# Patient Record
Sex: Female | Born: 1945 | Race: White | Hispanic: No | Marital: Married | Smoking: Former smoker
Health system: Southern US, Community
[De-identification: ages and names within clinical notes are randomized; demographics above are authoritative.]

## PROBLEM LIST (undated history)

## (undated) DIAGNOSIS — I471 Supraventricular tachycardia, unspecified: Secondary | ICD-10-CM

## (undated) DIAGNOSIS — E042 Nontoxic multinodular goiter: Secondary | ICD-10-CM

## (undated) DIAGNOSIS — R2689 Other abnormalities of gait and mobility: Secondary | ICD-10-CM

## (undated) DIAGNOSIS — M1990 Unspecified osteoarthritis, unspecified site: Secondary | ICD-10-CM

## (undated) DIAGNOSIS — F419 Anxiety disorder, unspecified: Secondary | ICD-10-CM

## (undated) DIAGNOSIS — E785 Hyperlipidemia, unspecified: Secondary | ICD-10-CM

## (undated) DIAGNOSIS — H532 Diplopia: Secondary | ICD-10-CM

## (undated) DIAGNOSIS — R55 Syncope and collapse: Secondary | ICD-10-CM

## (undated) DIAGNOSIS — F329 Major depressive disorder, single episode, unspecified: Secondary | ICD-10-CM

## (undated) DIAGNOSIS — I493 Ventricular premature depolarization: Secondary | ICD-10-CM

## (undated) DIAGNOSIS — F32A Depression, unspecified: Secondary | ICD-10-CM

## (undated) DIAGNOSIS — I10 Essential (primary) hypertension: Secondary | ICD-10-CM

## (undated) DIAGNOSIS — G4733 Obstructive sleep apnea (adult) (pediatric): Secondary | ICD-10-CM

## (undated) HISTORY — DX: Hyperlipidemia, unspecified: E78.5

## (undated) HISTORY — PX: TONSILLECTOMY: SUR1361

## (undated) HISTORY — DX: Supraventricular tachycardia: I47.1

## (undated) HISTORY — DX: Anxiety disorder, unspecified: F41.9

## (undated) HISTORY — DX: Depression, unspecified: F32.A

## (undated) HISTORY — DX: Major depressive disorder, single episode, unspecified: F32.9

## (undated) HISTORY — DX: Ventricular premature depolarization: I49.3

## (undated) HISTORY — DX: Unspecified osteoarthritis, unspecified site: M19.90

## (undated) HISTORY — DX: Obstructive sleep apnea (adult) (pediatric): G47.33

## (undated) HISTORY — DX: Other abnormalities of gait and mobility: R26.89

## (undated) HISTORY — DX: Supraventricular tachycardia, unspecified: I47.10

## (undated) HISTORY — PX: ABDOMINAL HYSTERECTOMY: SHX81

## (undated) HISTORY — DX: Essential (primary) hypertension: I10

## (undated) HISTORY — DX: Diplopia: H53.2

## (undated) HISTORY — PX: OTHER SURGICAL HISTORY: SHX169

## (undated) HISTORY — DX: Syncope and collapse: R55

## (undated) HISTORY — DX: Nontoxic multinodular goiter: E04.2

---

## 2002-07-02 ENCOUNTER — Emergency Department (HOSPITAL_COMMUNITY): Admission: EM | Admit: 2002-07-02 | Discharge: 2002-07-02 | Payer: Self-pay

## 2002-08-12 ENCOUNTER — Ambulatory Visit (HOSPITAL_BASED_OUTPATIENT_CLINIC_OR_DEPARTMENT_OTHER): Admission: RE | Admit: 2002-08-12 | Discharge: 2002-08-12 | Payer: Self-pay | Admitting: Cardiology

## 2003-06-25 ENCOUNTER — Ambulatory Visit (HOSPITAL_COMMUNITY): Admission: RE | Admit: 2003-06-25 | Discharge: 2003-06-25 | Payer: Self-pay | Admitting: Family Medicine

## 2003-09-01 ENCOUNTER — Inpatient Hospital Stay (HOSPITAL_COMMUNITY): Admission: EM | Admit: 2003-09-01 | Discharge: 2003-09-02 | Payer: Self-pay | Admitting: Emergency Medicine

## 2003-09-01 ENCOUNTER — Encounter (INDEPENDENT_AMBULATORY_CARE_PROVIDER_SITE_OTHER): Payer: Self-pay | Admitting: Cardiology

## 2003-09-05 ENCOUNTER — Emergency Department (HOSPITAL_COMMUNITY): Admission: EM | Admit: 2003-09-05 | Discharge: 2003-09-05 | Payer: Self-pay | Admitting: Emergency Medicine

## 2003-09-22 ENCOUNTER — Emergency Department (HOSPITAL_COMMUNITY): Admission: EM | Admit: 2003-09-22 | Discharge: 2003-09-22 | Payer: Self-pay | Admitting: Emergency Medicine

## 2003-09-27 ENCOUNTER — Emergency Department (HOSPITAL_COMMUNITY): Admission: EM | Admit: 2003-09-27 | Discharge: 2003-09-27 | Payer: Self-pay | Admitting: Emergency Medicine

## 2005-01-06 ENCOUNTER — Ambulatory Visit (HOSPITAL_COMMUNITY): Admission: RE | Admit: 2005-01-06 | Discharge: 2005-01-06 | Payer: Self-pay | Admitting: Cardiovascular Disease

## 2006-01-20 ENCOUNTER — Ambulatory Visit (HOSPITAL_COMMUNITY): Admission: RE | Admit: 2006-01-20 | Discharge: 2006-01-20 | Payer: Self-pay | Admitting: Cardiovascular Disease

## 2006-04-05 ENCOUNTER — Emergency Department (HOSPITAL_COMMUNITY): Admission: EM | Admit: 2006-04-05 | Discharge: 2006-04-05 | Payer: Self-pay | Admitting: Family Medicine

## 2006-07-21 ENCOUNTER — Emergency Department (HOSPITAL_COMMUNITY): Admission: EM | Admit: 2006-07-21 | Discharge: 2006-07-21 | Payer: Self-pay | Admitting: Emergency Medicine

## 2006-08-03 ENCOUNTER — Emergency Department (HOSPITAL_COMMUNITY): Admission: EM | Admit: 2006-08-03 | Discharge: 2006-08-03 | Payer: Self-pay | Admitting: Emergency Medicine

## 2007-02-09 ENCOUNTER — Ambulatory Visit (HOSPITAL_COMMUNITY): Admission: RE | Admit: 2007-02-09 | Discharge: 2007-02-09 | Payer: Self-pay | Admitting: Internal Medicine

## 2007-07-26 ENCOUNTER — Emergency Department (HOSPITAL_COMMUNITY): Admission: EM | Admit: 2007-07-26 | Discharge: 2007-07-27 | Payer: Self-pay | Admitting: Emergency Medicine

## 2007-07-27 ENCOUNTER — Inpatient Hospital Stay (HOSPITAL_COMMUNITY): Admission: EM | Admit: 2007-07-27 | Discharge: 2007-07-28 | Payer: Self-pay | Admitting: Emergency Medicine

## 2007-07-31 ENCOUNTER — Ambulatory Visit: Payer: Self-pay | Admitting: *Deleted

## 2008-04-03 ENCOUNTER — Ambulatory Visit (HOSPITAL_COMMUNITY): Admission: RE | Admit: 2008-04-03 | Discharge: 2008-04-03 | Payer: Self-pay | Admitting: Internal Medicine

## 2009-06-24 ENCOUNTER — Ambulatory Visit (HOSPITAL_COMMUNITY): Admission: RE | Admit: 2009-06-24 | Discharge: 2009-06-24 | Payer: Self-pay | Admitting: Internal Medicine

## 2010-04-20 ENCOUNTER — Encounter: Admission: RE | Admit: 2010-04-20 | Discharge: 2010-04-20 | Payer: Self-pay | Admitting: Internal Medicine

## 2010-07-17 ENCOUNTER — Other Ambulatory Visit: Payer: Self-pay | Admitting: Internal Medicine

## 2010-07-17 DIAGNOSIS — E041 Nontoxic single thyroid nodule: Secondary | ICD-10-CM

## 2010-07-18 ENCOUNTER — Encounter: Payer: Self-pay | Admitting: Internal Medicine

## 2010-07-19 ENCOUNTER — Encounter: Payer: Self-pay | Admitting: Internal Medicine

## 2010-09-20 ENCOUNTER — Other Ambulatory Visit (HOSPITAL_COMMUNITY): Payer: Self-pay | Admitting: Internal Medicine

## 2010-09-20 DIAGNOSIS — Z1231 Encounter for screening mammogram for malignant neoplasm of breast: Secondary | ICD-10-CM

## 2010-09-23 ENCOUNTER — Ambulatory Visit (HOSPITAL_COMMUNITY)
Admission: RE | Admit: 2010-09-23 | Discharge: 2010-09-23 | Disposition: A | Discharge status: Home / Self Care | Payer: Medicare Other | Source: Ambulatory Visit | Attending: Internal Medicine | Admitting: Internal Medicine

## 2010-09-23 DIAGNOSIS — Z1231 Encounter for screening mammogram for malignant neoplasm of breast: Secondary | ICD-10-CM | POA: Insufficient documentation

## 2010-10-14 ENCOUNTER — Emergency Department (HOSPITAL_COMMUNITY)
Admission: EM | Admit: 2010-10-14 | Discharge: 2010-10-14 | Disposition: A | Discharge status: Home / Self Care | Payer: Medicare Other | Attending: Emergency Medicine | Admitting: Emergency Medicine

## 2010-10-14 DIAGNOSIS — R0602 Shortness of breath: Secondary | ICD-10-CM | POA: Insufficient documentation

## 2010-10-14 DIAGNOSIS — I10 Essential (primary) hypertension: Secondary | ICD-10-CM | POA: Insufficient documentation

## 2010-10-14 DIAGNOSIS — R259 Unspecified abnormal involuntary movements: Secondary | ICD-10-CM | POA: Insufficient documentation

## 2010-10-14 DIAGNOSIS — R209 Unspecified disturbances of skin sensation: Secondary | ICD-10-CM | POA: Insufficient documentation

## 2010-10-14 DIAGNOSIS — R42 Dizziness and giddiness: Secondary | ICD-10-CM | POA: Insufficient documentation

## 2010-10-18 ENCOUNTER — Ambulatory Visit
Admission: RE | Admit: 2010-10-18 | Discharge: 2010-10-18 | Disposition: A | Discharge status: Home / Self Care | Payer: Medicare Other | Source: Ambulatory Visit | Attending: Internal Medicine | Admitting: Internal Medicine

## 2010-10-18 DIAGNOSIS — E041 Nontoxic single thyroid nodule: Secondary | ICD-10-CM

## 2010-11-09 NOTE — H&P (Signed)
NAMEILIZA, BLANKENBECKLER             ACCOUNT NO.:  [id number]   MEDICAL RECORD NO.:  [id number]          PATIENT TYPE:  INP   LOCATION:  1826                         FACILITY:  [facility]   PHYSICIAN:  Michiel Cowboy, MDDATE OF BIRTH:  Apr 25, 1946   DATE OF ADMISSION:  07/27/2007  DATE OF DISCHARGE:                              HISTORY & PHYSICAL   PRIMARY CARE PHYSICIAN:  Theressa Millard, M.D.   The patient is a 65 year old female with past medical history of  hypertension, hyperlipidemia, TIA, SVT in the past who presented to Dr.  Newell Coral office a few days ago with history of right eye halos and  dimness as well as decreased acuity to colors which is transient.  She  had two episodes of this, one two days ago and one yesterday.  The  patient was referred to see an ophthalmologist who saw the patient and  thought that she may have iritis but also felt that her symptoms could  not be explained by iritis itself and felt that the patient needed to  have further evaluation for potential TIA.  The patient was referred to  [facility] ED.  [facility] called to admit.   Otherwise, the patient denies fevers, chills, neurological deficits.  No  trouble with gait.  No numbness, tingling, or any other abnormalities.  The patient does report headache and tenderness over the right temple.  The patient also reports chest pain on inspiration that has been there  for a month or so.  She notes that this is worse while in the shower.  Otherwise, no cough, no chest pain.   PAST MEDICAL HISTORY:  1. Hyperlipidemia.  2. Hypertension.  3. SVT.  4. History of TIAs in 2005 for which the patient was taking full-dose      aspirin.   SOCIAL HISTORY:  The patient lives at home.  She does not smoke, does  not drink.  She does not use IV drugs.   FAMILY HISTORY:  Noncontributory.   ALLERGIES:  KEFLEX.   MEDICATIONS:  1. Aspirin 325 mg p.o. daily.  2. Zocor 20 mg p.o. daily.  3. Toprol-XL 25 mg  p.o. daily.  4. Verapamil 120 mg p.o. daily.  5. Benicar 20/6.25 mg p.o. daily.  6. Fish oil daily.   PHYSICAL EXAMINATION:  VITAL SIGNS:  Blood pressure 164/77, respirations  22, pulse 98, saturating 98% on room air, temperature 98.3.  HEENT:  Atraumatic.  PERRL.  Right sclera somewhat injected.  Pupils  about 5 mm bilaterally.  Of note, the patient had them recently dilated  in the ophthalmologist office.  Funduscopic exam showed some silver  lining of vessels but otherwise unremarkable.  NECK:  No bruits.  Supple.  HEART:  Regular rate and rhythm.  No murmurs, rubs, or gallops.  LUNGS:  Clear to auscultation bilaterally.  ABDOMEN:  Nontender, nondistended.  EXTREMITIES:  No lower extremity edema.  NEUROLOGIC:  Otherwise, neurologically intact.  MUSCULOSKELETAL:  No rib tenderness noted.   STUDIES:  CT scan of the head negative.  EKG showing no acute ischemia  or infarction, but there is noted to  be one R wave.  Otherwise, no Q  waves noted.   LABORATORY DATA:  Sodium 141, potassium 3.6, creatinine 0.7.  White  blood cell count 5.1, hemoglobin 13.2, platelets 256.   ASSESSMENT AND PLAN:  This is a 65 year old female with history of  hypertension, hyperlipidemia, and transient ischemic attack presenting  with right eye complaints.  1. Right eye complaints, change in visual acuity and color as well as      halo.  This is somewhat atypical for transient ischemic attack, but      nevertheless, given the history of transient ischemic attack in the      past, we will obtain MRI/MRA of the brain.  The patient has had an      MRI of the brain in the past which was worrisome for possible right      middle cerebral artery territory abnormality, although artifact      could not be excluded.  Also, there was a question of internal      carotid bifurcation abnormality, although carotid Dopplers were      negative.  Will obtain MRA of the neck to evaluate this.  Given      temporal  tenderness and mild headache and visual changes, would      obtain sedimentation rate to assess for temporal arteritis.  The      patient is above 65 years old.  If sedimentation rate is severely      elevated, would consider possible temporal artery biopsy.  2. Given history of occasional pleuritic chest pain, will obtain a      chest x-ray  3. Hypertension.  Will continue verapamil and metoprolol as well as      given history of supraventricular tachycardia, will put her on      telemetry.  Will hold off on Benicar to avoid overtreatment of      blood pressure at this point.  4. Hyperlipidemia.  Will continue Zocor.  5. Prophylaxis.  Lovenox and good oral intake.      Michiel Cowboy, MD  Electronically Signed     AVD/MEDQ  D:  07/27/2007  T:  07/27/2007  Job:  [id number]   cc:   Theressa Millard, M.D.

## 2010-11-09 NOTE — Procedures (Signed)
CAROTID DUPLEX EXAM   INDICATION:  Followup evaluation of TIA.   HISTORY:  Diabetes:  No.  Cardiac:  Possible history of MI, supraventricular tachycardia.  Hypertension:  Yes.  Smoking:  No.  Previous Surgery:  No.  CV History:  The patient had a loss of color in her right eye for 6  hours one week ago.  Amaurosis Fugax No, Paresthesias No, Hemiparesis No                                       RIGHT             LEFT  Brachial systolic pressure:         130               130  Brachial Doppler waveforms:         Triphasic         Triphasic  Vertebral direction of flow:        Antegrade         Antegrade  DUPLEX VELOCITIES (cm/sec)  CCA peak systolic                   200               72  ECA peak systolic                   88                68  ICA peak systolic                   55                63  ICA end diastolic                   14                22  PLAQUE MORPHOLOGY:                  Soft              Soft  PLAQUE AMOUNT:                      Mild              Mild  PLAQUE LOCATION:                    Proximal ICA      Proximal ICA   IMPRESSION:  20-39% ICA stenosis bilaterally.   ___________________________________________  P. Liliane Bade, M.D.   MC/MEDQ  D:  07/31/2007  T:  08/01/2007  Job:  [id number]

## 2010-11-12 NOTE — H&P (Signed)
[name], [name]                         ACCOUNT NO.:  [id number]   MEDICAL RECORD NO.:  [id number]                   PATIENT TYPE:  INP   LOCATION:  3739                                 FACILITY:  [facility]   PHYSICIAN:  Santina Evans A. Orlin Hilding, M.D.          DATE OF BIRTH:  [birth year]   DATE OF ADMISSION:  09/01/2003  DATE OF DISCHARGE:                                HISTORY & PHYSICAL   CHIEF COMPLAINT:  Transient right-sided weakness.   HISTORY OF PRESENT ILLNESS:  A 65 year old right handed, married white woman  with hypertension who was in bed watching television, got up around 1  o'clock in the morning to go to the bathroom and was staggering and felt  that her right side was weaker.  Her husband was out of town so she called a  neighbor who brought her to the emergency room and her symptoms have cleared  now.   REVIEW OF SYSTEMS:  She had a left temporal headache preceding the symptoms  which has now gone.  No chest pain or shortness of breath.  No vision  problems, no speech or swallowing difficulties.  No numbness, perhaps some  tingling.   PAST MEDICAL HISTORY:  Significant for hypertension.   MEDICATIONS:  1. Toprol.  2. Verapamil.  3. Hydrochlorothiazide.   ALLERGIES:  KEFLEX.   SOCIAL HISTORY:  Remote cigarette use.  She is married and unemployed.   FAMILY HISTORY:  Positive for stroke.   PHYSICAL EXAMINATION:  VITAL SIGNS:  Temperature 98.2, pulse 84, blood  pressure 179/88, respirations 16, 96% saturation.  NEUROLOGIC:  Normal neurologic exam.  Mental status:  She is awake, alert  and appropriate with motor movement fluent and spontaneous.  Language with  no neglect.  Cranial nerves:  Pupils are equal and reactive, extraocular  movements are intact.  Facial sensation is normal.  Facial motor activity is  normal.  Hearing is intact.  Palate symmetric and tongue is midline.  There  is no dysarthria.  Motor exam:  There is no drift, she has normal bulk, tone  and strength throughout.  Normal station and gait.  5/5 strength everywhere,  no satelliting, no drift, normal __________ movement, no fasciculations,  atrophy or tremor.  Deep tendon reflexes are trace, 1+ at the knees,  downgoing toes, plantar stimulation.  Coordination:  Finger-to-nose, rapid  alternating movements and heel-to-shin are normal.  Sensory exam is normal.   CT scan of the brain is essentially normal with question of very small old  lacunar infarction on the left __________.   LABORATORY DATA:  Normal CBC.  Potassium is 3.2, glucose 115.  INR 0.8.   IMPRESSION:  Left brain transient ischemic attack with transient right-sided  weakness or incoordination, now cleared.   PLAN:  Admit to stroke service for workup to include MRI of the brain, MR  angiogram, 2-D echocardiogram, carotid ultrasound.  We will add aspirin 325  mg daily and also  potassium as her potassium is a little bit low and she is  on hydrochlorothiazide.                                                Catherine A. Orlin Hilding, M.D.    CAW/MEDQ  D:  09/01/2003  T:  09/01/2003  Job:  [id number]

## 2010-11-12 NOTE — H&P (Signed)
[name], [name]                         ACCOUNT NO.:  [id number]   MEDICAL RECORD NO.:  [id number]                   PATIENT TYPE:  INP   LOCATION:  3739                                 FACILITY:  [facility]   PHYSICIAN:  Pramod P. Pearlean Brownie, MD                 DATE OF BIRTH:  1946-02-19   DATE OF ADMISSION:  09/01/2003  DATE OF DISCHARGE:  09/02/2003                                HISTORY & PHYSICAL   DIAGNOSES AT THE TIME OF DISCHARGE:  1. Left brain transient ischemic attack.  2. Hypertension.  3. Hyperlipidemia.   MEDICATIONS AT THE TIME OF DISCHARGE:  1. Aspirin 325 every day.  2. Zocor 20 mg every day.  3. Hydrochlorothiazide 12.5 every day.  4. Toprol 50 mg every day.  5. Verapamil 120 mg every day.   STUDIES PERFORMED:  1. CT of the brain, on admission, was negative.  2. MRI was negative for acute infarction.  There was some question of tiny     punctate focus but that was determined to be negative and non-existent.  3. MRA of the brain shows abnormal appearance of the right middle cerebral     artery territory which may or may not be pathological.  Do not feel like     there is any significant stenosis; however, there may be CT angiogram was     recommended.  4. A 2D echocardiogram was normal.  5. Carotid doppler is normal.  6. EKG:  Normal sinus rhythm, ST abnormality, question digitalis effect.   LABORATORY STUDIES:  UA with 30 of protein, many epithelial cells, rare  bacteria.  Coagulation studies normal.  Hemoglobin 13.6, hematocrit 40.7,  white blood cells 6.2, platelets 321.  Differential was normal.  Sodium 139,  potassium 3.2, chloride 106, BUN 23, glucose 115.  PH 7.456, pCO2 32.3,  bicarb 22.7.  Hematocrit 41, hemoglobin 13.9.  Urine drug screen was  negative.  Lipid profile with cholesterol of 226, triglycerides 206, HDL 43,  and LDL 142.  Homocystine and hemoglobin A1c were pending at the time of  discharge.  Liver function studies were normal.   HISTORY  OF PRESENT ILLNESS:  [name] is a 65 year old, right  handed, married, white female with a history of hypertension who was up in  the middle of the night watching television and got up around 1 a.m. to go  to the bathroom.  She felt she was staggering, and her right side was  weaker.  Her husband was out of town, so she called a neighbor who brought  to the emergency room.  Her symptoms resolved in the emergency room.  She  was __________  or a TPA candidate secondary to quick resolution.  The  patient will be admitted for further stroke evaluation.   HOSPITAL COURSE:  MRI was negative for negative for acute infarct, though  the patient was found to have a left  brain TIA.  Workup was positive for  dyslipidemia for which she was started on Zocor.  The patient was advised to  loose weight.  Blood pressure, in the hospital, remained relatively stable  ranging from the 110s-166/70s-80s and only once to 98.  Deficits did remain  completely resolved.  There was some discussion about doing a CT angiogram.  The right MCA territory branches were obviously different from the left MCA  branches; however, it was not sure if this was congenital or an acute  process.  There was no obvious major stenosis or limited blood flow.  The  patient declined to have arteriogram at present time and will consider in  future should symptoms resolved.  Education given to patient in depth.   CONDITION ON DISCHARGE:  Neurologically normal.   DISCHARGE PLAN:  1. Aspirin for secondary prevention.  Zocor for elevated cholesterol.  Will     need followup testing in 6-8 weeks with refill of prescription.  Okay for     primary or cardiologist to do this whichever the patient chooses.  2. Followup with either primary or cardiologist for high blood pressure and     question adjust of medicines.  Do not typically adjust medicines in the     setting of possible acute stroke.  3. Discharged home with husband.  4.  Followup with Dr. Pearlean Brownie in two months.      Annie Main, N.P.                         Pramod P. Pearlean Brownie, MD    SB/MEDQ  D:  09/02/2003  T:  09/02/2003  Job:  [id number]   cc:   Pramod P. Pearlean Brownie, MD  Fax: [phone]   Carola J. Gerri Spore, M.D.  [address]  [place]  [place] [address]  Fax: [phone]   Peter M. [place], M.D.  [address], [address]  [place], [place] [address]  Fax: [phone]

## 2010-11-12 NOTE — Discharge Summary (Signed)
[name], QUINTEROS             ACCOUNT NO.:  [id number]   MEDICAL RECORD NO.:  [id number]          PATIENT TYPE:  INP   LOCATION:  3706                         FACILITY:  [facility]   PHYSICIAN:  Georgann Housekeeper, MD      DATE OF BIRTH:  1945-09-25   DATE OF ADMISSION:  07/27/2007  DATE OF DISCHARGE:  07/28/2007                               DISCHARGE SUMMARY   DIAGNOSES:  1. Questionable transient ischemic attack with right eye vision      changes, resolved.  2. Hypertension.  3. Dyslipidemia.   MEDICATIONS:  1. Aspirin 325 mg daily.  2. Plavix 25 mg daily.  3. Verapamil 120 mg daily.  4. Zocor 20 mg daily.  5. Benicar HCT 20/6.25 daily.  6. Toprol 25 mg daily.  7. Fish oil capsules.   MRI/MRA negative.   HOSPITAL COURSE:  The patient is 65 year old admitted with some right  eye vision changes and history of hypertension.  The patient's symptoms  resolved in the hospital.  She had normal blood work.  MRI/MRA was  negative for any acute stroke.  The patient was started on Plavix,  continued on aspirin.  Dyslipidemia, cholesterol is very high.  Was  started on Zocor.  The patient went home with followup as outpatient  with Dr. Earl Gala.  Outpatient ultrasound for carotid and echo.      Georgann Housekeeper, MD  Electronically Signed     KH/MEDQ  D:  09/18/2007  T:  09/19/2007  Job:  [id number]

## 2011-03-16 ENCOUNTER — Other Ambulatory Visit: Payer: Self-pay | Admitting: Internal Medicine

## 2011-03-16 DIAGNOSIS — M545 Low back pain, unspecified: Secondary | ICD-10-CM

## 2011-03-16 DIAGNOSIS — R634 Abnormal weight loss: Secondary | ICD-10-CM

## 2011-03-17 LAB — BASIC METABOLIC PANEL
BUN: 17
BUN: 18
CO2: 23
CO2: 28
Calcium: 8.9
Calcium: 9.8
Chloride: 107
Chloride: 108
Creatinine, Ser: 0.77
Creatinine, Ser: 0.86
GFR calc Af Amer: >60
GFR calc non Af Amer: >60
GFR calc non Af Amer: >60
Glucose, Bld: 103 — ABNORMAL HIGH
Glucose, Bld: 112 — ABNORMAL HIGH
Potassium: 3.2 — ABNORMAL LOW
Potassium: 3.6
Sodium: 141
Sodium: 142

## 2011-03-17 LAB — COMPREHENSIVE METABOLIC PANEL
ALT: 21
AST: 21
Albumin: 4.1
Alkaline Phosphatase: 62
BUN: 17
CO2: 26
Calcium: 9.8
Chloride: 105
Creatinine, Ser: 0.89
GFR calc Af Amer: >60
GFR calc non Af Amer: >60
Glucose, Bld: 116 — ABNORMAL HIGH
Potassium: 3.2 — ABNORMAL LOW
Sodium: 139
Total Bilirubin: 0.8
Total Protein: 6.8

## 2011-03-17 LAB — CARDIAC PANEL(CRET KIN+CKTOT+MB+TROPI)
CK, MB: 2.2
Relative Index: INVALID
Total CK: 99
Troponin I: 0.02

## 2011-03-17 LAB — CBC
HCT: 36.3
HCT: 39.5
Hemoglobin: 12.5
Hemoglobin: 13.7
MCHC: 34.3
MCHC: 34.6
MCV: 90
MCV: 90.6
Platelets: 229
Platelets: 256
RBC: 4.01
RBC: 4.39
RDW: 12.5
RDW: 12.6
WBC: 4.9
WBC: 5.1

## 2011-03-17 LAB — SEDIMENTATION RATE: Sed Rate: 4

## 2011-03-17 LAB — DIFFERENTIAL
Basophils Absolute: 0
Basophils Relative: 1
Eosinophils Absolute: 0
Eosinophils Relative: 0
Lymphocytes Relative: 17
Lymphs Abs: 0.8
Monocytes Absolute: 0.4
Monocytes Relative: 8
Neutro Abs: 3.8
Neutrophils Relative %: 75

## 2011-03-17 LAB — HOMOCYSTEINE: Homocysteine: 8.9

## 2011-03-17 LAB — PROTIME-INR
INR: 0.9
Prothrombin Time: 11.9

## 2011-03-17 LAB — APTT: aPTT: 30

## 2011-03-17 LAB — HEMOGLOBIN A1C: Mean Plasma Glucose: 136

## 2011-03-17 LAB — LIPASE, BLOOD: Lipase: 29

## 2011-03-18 ENCOUNTER — Ambulatory Visit
Admission: RE | Admit: 2011-03-18 | Discharge: 2011-03-18 | Disposition: A | Discharge status: Home / Self Care | Payer: Medicare Other | Source: Ambulatory Visit | Attending: Internal Medicine | Admitting: Internal Medicine

## 2011-03-18 DIAGNOSIS — M545 Low back pain, unspecified: Secondary | ICD-10-CM

## 2011-03-18 DIAGNOSIS — R634 Abnormal weight loss: Secondary | ICD-10-CM

## 2011-03-30 ENCOUNTER — Other Ambulatory Visit: Payer: Self-pay | Admitting: Gastroenterology

## 2011-04-04 ENCOUNTER — Ambulatory Visit
Admission: RE | Admit: 2011-04-04 | Discharge: 2011-04-04 | Disposition: A | Discharge status: Home / Self Care | Payer: Medicare Other | Source: Ambulatory Visit | Attending: Gastroenterology | Admitting: Gastroenterology

## 2011-04-06 ENCOUNTER — Observation Stay (HOSPITAL_COMMUNITY)
Admission: EM | Admit: 2011-04-06 | Discharge: 2011-04-07 | Disposition: A | Discharge status: Home / Self Care | Payer: Medicare Other | Attending: Internal Medicine | Admitting: Internal Medicine

## 2011-04-06 ENCOUNTER — Emergency Department (HOSPITAL_COMMUNITY): Payer: Medicare Other

## 2011-04-06 DIAGNOSIS — R1310 Dysphagia, unspecified: Secondary | ICD-10-CM | POA: Insufficient documentation

## 2011-04-06 DIAGNOSIS — E785 Hyperlipidemia, unspecified: Secondary | ICD-10-CM | POA: Insufficient documentation

## 2011-04-06 DIAGNOSIS — R259 Unspecified abnormal involuntary movements: Principal | ICD-10-CM | POA: Insufficient documentation

## 2011-04-06 DIAGNOSIS — I6529 Occlusion and stenosis of unspecified carotid artery: Secondary | ICD-10-CM | POA: Insufficient documentation

## 2011-04-06 DIAGNOSIS — L650 Telogen effluvium: Secondary | ICD-10-CM | POA: Insufficient documentation

## 2011-04-06 DIAGNOSIS — R5381 Other malaise: Secondary | ICD-10-CM | POA: Insufficient documentation

## 2011-04-06 DIAGNOSIS — G4733 Obstructive sleep apnea (adult) (pediatric): Secondary | ICD-10-CM | POA: Insufficient documentation

## 2011-04-06 DIAGNOSIS — R7301 Impaired fasting glucose: Secondary | ICD-10-CM | POA: Insufficient documentation

## 2011-04-06 DIAGNOSIS — I10 Essential (primary) hypertension: Secondary | ICD-10-CM | POA: Insufficient documentation

## 2011-04-06 DIAGNOSIS — K148 Other diseases of tongue: Secondary | ICD-10-CM | POA: Insufficient documentation

## 2011-04-06 DIAGNOSIS — R634 Abnormal weight loss: Secondary | ICD-10-CM | POA: Insufficient documentation

## 2011-04-06 LAB — COMPREHENSIVE METABOLIC PANEL
ALT: 15 U/L (ref 0–35)
AST: 16 U/L (ref 0–37)
Albumin: 4.2 g/dL (ref 3.5–5.2)
Alkaline Phosphatase: 69 U/L (ref 39–117)
BUN: 15 mg/dL (ref 6–23)
CO2: 27 mEq/L (ref 19–32)
Calcium: 10 mg/dL (ref 8.4–10.5)
Chloride: 104 mEq/L (ref 96–112)
Creatinine, Ser: 0.81 mg/dL (ref 0.50–1.10)
GFR calc Af Amer: 86 mL/min — ABNORMAL LOW (ref >90)
GFR calc non Af Amer: 74 mL/min — ABNORMAL LOW (ref >90)
Glucose, Bld: 104 mg/dL — ABNORMAL HIGH (ref 70–99)
Potassium: 3.6 mEq/L (ref 3.5–5.1)
Sodium: 143 mEq/L (ref 135–145)
Total Bilirubin: 0.4 mg/dL (ref 0.3–1.2)
Total Protein: 7.4 g/dL (ref 6.0–8.3)

## 2011-04-06 LAB — DIFFERENTIAL
Basophils Absolute: 0 10*3/uL (ref 0.0–0.1)
Basophils Relative: 1 % (ref 0–1)
Eosinophils Absolute: 0 10*3/uL (ref 0.0–0.7)
Eosinophils Relative: 0 % (ref 0–5)
Lymphocytes Relative: 16 % (ref 12–46)
Lymphs Abs: 1 10*3/uL (ref 0.7–4.0)
Monocytes Absolute: 0.5 10*3/uL (ref 0.1–1.0)
Monocytes Relative: 9 % (ref 3–12)
Neutro Abs: 4.5 10*3/uL (ref 1.7–7.7)
Neutrophils Relative %: 74 % (ref 43–77)

## 2011-04-06 LAB — CBC
HCT: 40.1 % (ref 36.0–46.0)
Hemoglobin: 14 g/dL (ref 12.0–15.0)
MCH: 30.8 pg (ref 26.0–34.0)
MCHC: 34.9 g/dL (ref 30.0–36.0)
MCV: 88.1 fL (ref 78.0–100.0)
Platelets: 270 10*3/uL (ref 150–400)
RBC: 4.55 MIL/uL (ref 3.87–5.11)
RDW: 12.5 % (ref 11.5–15.5)
WBC: 6.1 10*3/uL (ref 4.0–10.5)

## 2011-04-06 LAB — APTT: aPTT: 30 seconds (ref 24–37)

## 2011-04-06 LAB — URINALYSIS, ROUTINE W REFLEX MICROSCOPIC
Bilirubin Urine: NEGATIVE
Glucose, UA: NEGATIVE mg/dL
Hgb urine dipstick: NEGATIVE
Ketones, ur: NEGATIVE mg/dL
Leukocytes, UA: NEGATIVE
Nitrite: NEGATIVE
Protein, ur: NEGATIVE mg/dL
Specific Gravity, Urine: 1.009 (ref 1.005–1.030)
Urobilinogen, UA: 0.2 mg/dL (ref 0.0–1.0)
pH: 7 (ref 5.0–8.0)

## 2011-04-06 LAB — CARDIAC PANEL(CRET KIN+CKTOT+MB+TROPI)
CK, MB: 3.1 ng/mL (ref 0.3–4.0)
Relative Index: INVALID (ref 0.0–2.5)
Total CK: 67 U/L (ref 7–177)
Troponin I: <0.3 ng/mL (ref <0.30)

## 2011-04-06 LAB — PROTIME-INR
INR: 0.96 (ref 0.00–1.49)
Prothrombin Time: 13 seconds (ref 11.6–15.2)

## 2011-04-07 DIAGNOSIS — R0989 Other specified symptoms and signs involving the circulatory and respiratory systems: Secondary | ICD-10-CM

## 2011-04-07 LAB — HEMOGLOBIN A1C
Hgb A1c MFr Bld: 6.1 % — ABNORMAL HIGH (ref <5.7)
Mean Plasma Glucose: 128 mg/dL — ABNORMAL HIGH (ref <117)

## 2011-04-07 LAB — LIPID PANEL
Cholesterol: 237 mg/dL — ABNORMAL HIGH (ref 0–200)
HDL: 43 mg/dL (ref >39)
LDL Cholesterol: 148 mg/dL — ABNORMAL HIGH (ref 0–99)
Total CHOL/HDL Ratio: 5.5 RATIO
Triglycerides: 228 mg/dL — ABNORMAL HIGH (ref <150)
VLDL: 46 mg/dL — ABNORMAL HIGH (ref 0–40)

## 2011-04-07 LAB — TSH: TSH: 3.467 u[IU]/mL (ref 0.350–4.500)

## 2011-04-07 LAB — VITAMIN B12: Vitamin B-12: 522 pg/mL (ref 211–911)

## 2011-04-09 NOTE — Discharge Summary (Signed)
[name], [name] NO.:  [id number]  MEDICAL RECORD NO.:  [id number]  LOCATION:  3016                         FACILITY:  [facility]  PHYSICIAN:  Theressa Millard, M.D.    DATE OF BIRTH:  1946/06/06  DATE OF ADMISSION:  04/06/2011 DATE OF DISCHARGE:  04/07/2011                              DISCHARGE SUMMARY   ADMITTING DIAGNOSIS:  Tremor and transient paralysis or spasm of tongue.  DISCHARGE DIAGNOSES: 1. Generalized tremors, doubt seizure disorder. 2. Muscle twitching of the face and legs. 3. Weight loss, initially purposeful, now unintentional. 4. History of obstructive sleep apnea, not currently using CPAP. 5. Hypertension. 6. Impaired fasting glucose. 7. Hair loss, consistent with telogen effluvium.  The patient is a 65 year old white female who has been having significant issues lately.  She was found to have evidence of impaired fasting glucose in the middle of the summer.  At that time, she was instructed to avoid sugars as well as excessive "white" products (potatoes, starches, flour, etc).  The results of that was significant weight loss, totaling about 22 pounds.  She became alarmed at this and decided that this might be a sign of disease process rather than a result of her dietary change.  She therefore started to eat more, but has been unable to gain weight and this has added to her concern that there is a serious disorder.  She has seen several providers in our office including Dr. Donette Larry and Dr. Laural Benes as well as Lavinia Sharps, nurse practitioner and has had a number of tests done with negative findings.  She had a CT scan of the abdomen and pelvis in September that looked fine.  She had a barium swallow that showed some tertiary contractions.  Extensive laboratory testing has been repeatedly negative including thyroid tests, chemistry panels, and urinalysis.  She developed some problems with some muscle cramping and came into the office and I  could not find any specific etiology for that.  She has had hair loss and has been seen by Dermatology who thinks that this is telogen effluvium.  She is very concerned that there is some undiagnosed problem reflecting her at this point.  On the morning of admission, she awoke and had a very dry mouth, and felt like her tongue was almost cramping and sticking out on its own.  She could not speak clearly. These symptoms lasted for several minutes.  She called her husband eventually and he brought her to the emergency department.  Upon admission, she did describe 2 episodes in which she was awakening from sleep in which she seemed to be tremoring or shaking.  She was awake and alert during those events.  HOSPITAL COURSE:  The patient was seen and evaluated by the hospitalist and no specific etiology for her symptoms could be found.  MRI of the brain revealed no significant changes from before with only a slight bit of white matter disease.  A carotid Doppler preliminarily was negative. She continues to be very concerned that she has got something, including the possibility of ALS (note that her friend recently died of ALS).  I have tried to reassure her that we have not been able  to find anything so far and that this may simply be related to her profound weight loss, but she remains very concerned.  For further evaluation, we will refer her to Neurology as an outpatient, so NCV and EMG could be considered to rule out neuromuscular disease as a cause of these problems.  DISCHARGE MEDICATIONS: 1. Benicar 20 mg once daily. 2. Verapamil SR 120 mg once daily. 3. Aspirin 325 mg once daily. 4. Fish oil 1000 mg daily. 5. Multivitamin daily. 6. Diazepam 10 mg one-quarter to one-half tablet p.r.n. (this was used     prior to her MRI).  ACTIVITY:  As tolerated.  DIET:  No restrictions.  We will ask Neurology to try to work her in sooner.  She currently has a visit scheduled for about 2  weeks from now.     Theressa Millard, M.D.     JO/MEDQ  D:  04/07/2011  T:  04/07/2011  Job:  [id number]  Electronically Signed by Theressa Millard M.D. on 04/09/2011 08:47:43 PM

## 2011-04-16 NOTE — H&P (Signed)
[name], [name] NO.:  [id number]  MEDICAL RECORD NO.:  [id number]  LOCATION:  3016                         FACILITY:  [facility]  PHYSICIAN:  Erick Blinks, MD     DATE OF BIRTH:  1945-08-03  DATE OF ADMISSION:  04/06/2011 DATE OF DISCHARGE:                             HISTORY & PHYSICAL   PRIMARY CARE PHYSICIAN:  Theressa Millard, MD  CHIEF COMPLAINT:  Generalized tremor and transient paralysis of the tongue.  HISTORY OF PRESENT ILLNESS:  This is a 65 year old female with history of hypertension and hyperlipidemia.  She presents to the emergency room today when she was reported to have transient paralysis of her tongue. She reports that she was having some difficulty swallowing and also has noticed that her tongue became very stiff and then stuck out.  Her symptoms resolved within 5 minutes after which she was able to swallow again.  She denied any headache or diplopia, recent changes in vision. She has had no chest pain or shortness of breath.  No fever, cough.  She does report diffuse muscular fasciculations, which have been occurring for the past few weeks and progressively have gotten worse.  She noticed that these have worsened in her legs at night, but notices them all over her body, especially in her arms.  She reports that in the past few months since April of this year she lost approximately 28 pounds, which has been unintentional.  She is feeling increasingly weak and is extremely concerned about her overall condition.  She is very fixated that her underlying problem maybe secondary to amyotrophic lateral sclerosis especially since one of her close friends had recently passed away from ALS.  In the ER, the patient had basic blood work done, which was negative for any acute findings.  She was subsequently referred for admission for rule out transient ischemic attack.  PAST MEDICAL HISTORY: 1. Hypertension. 2. Hyperlipidemia. 3. Possible  prediabetes.  MEDICATIONS PRIOR TO ADMISSION: 1. Valium daily as needed. 2. Multivitamins 1 tablet daily. 3. Fish oil over the counter 1 capsule daily. 4. Aspirin 325 mg daily. 5. Verapamil SR 120 mg 1 tablet daily. 6. Benicar 20 mg p.o. daily.  ALLERGIES: 1. KEFLEX causes itching. 2. EPINEPHRINE causes jittery/nervous.  FAMILY HISTORY:  No family history of neurologic diseases including amyotrophic lateral sclerosis or multiple sclerosis.  SOCIAL HISTORY:  She does not smoke.  She does not drink.  She does not use any IV drugs.  She lives at home with her husband.  REVIEW OF SYSTEMS:  All systems reviewed and pertinent positives as stated in the HPI, otherwise negative.  PHYSICAL EXAMINATION:  VITAL SIGNS:  Temperature 97.3, pulse of 67, respirations 19, blood pressure 173/83, pulse ox 97% on room air. GENERAL:  The patient in no acute distress, lying in bed.  She appears to be anxious. HEENT:  Pupils are equal, round, and reactive to light. NECK:  Supple. CHEST:  Clear to auscultation bilaterally. CARDIAC:  S1, S2 with a regular rate and rhythm. ABDOMEN:  Soft, nontender.  Bowel sounds are active. EXTREMITIES:  No cyanosis, clubbing, or edema. NEUROLOGIC:  The patient has 5/5 strength bilaterally in the upper extremities and lower  extremities.  Reflexes appears to be normal. Plantars are downgoing.  Cranial nerves II through XII appear to be grossly intact.  She does have fasciculations noted in her lower extremity muscles bilaterally.  LABORATORY DATA:  CBC and CMP have been reviewed and are essentially unremarkable.  Urinalysis does not show any signs of infection with negative nitrites and leukocytes.  MRI of the brain done shows mild chronic white matter lesions unchanged from 2009, no acute abnormality, no change from prior MRI.  EKG does not show any acute findings.  ASSESSMENT AND PLAN: 1. Generalized weakness. 2. Diffuse tremors. 3. Transient paralysis of the  tongue with dysphagia, possible     transient ischemic attack. 4. Unintentional weight loss. 5. Hypertension. 6. Hyperlipidemia. 7. Full code.  PLAN:  The patient will be admitted to the telemetry unit.  We will complete workup with a 2D echocardiogram and carotid Dopplers.  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  She is reportedly having a neurology appointment at the end of this month, but felt that her symptoms were progressing too quickly to wait that long.  Her symptoms may very well be associated with her underlying anxiety, although she does not want an anxiolytic right now.  The decision for inpatient neurology consultation versus outpatient follow up will be deferred to the patient's primary care physician.  He knows her very well.  At this time, we will add a TSH as well as a B12 and further workup may be done by Dr. Earl Gala.  The patient may benefit from EMGs, but again this will likely be need to be done on an outpatient basis. She will be admitted on observation status and further orders per the clinical course.  Erick Blinks, MD     JM/MEDQ  D:  04/06/2011  T:  04/06/2011  Job:  [id number]  cc:   Theressa Millard, M.D.  Electronically Signed by Erick Blinks  on 04/16/2011 07:31:13 PM

## 2011-04-25 ENCOUNTER — Other Ambulatory Visit: Payer: Self-pay | Admitting: Orthopaedic Surgery

## 2011-04-25 DIAGNOSIS — M4800 Spinal stenosis, site unspecified: Secondary | ICD-10-CM

## 2011-05-02 ENCOUNTER — Other Ambulatory Visit: Payer: Medicare Other

## 2011-05-03 ENCOUNTER — Ambulatory Visit
Admission: RE | Admit: 2011-05-03 | Discharge: 2011-05-03 | Disposition: A | Discharge status: Home / Self Care | Payer: Medicare Other | Source: Ambulatory Visit | Attending: Orthopaedic Surgery | Admitting: Orthopaedic Surgery

## 2011-05-03 DIAGNOSIS — M4800 Spinal stenosis, site unspecified: Secondary | ICD-10-CM

## 2011-09-12 DIAGNOSIS — R490 Dysphonia: Secondary | ICD-10-CM | POA: Insufficient documentation

## 2011-09-12 DIAGNOSIS — R1310 Dysphagia, unspecified: Secondary | ICD-10-CM | POA: Insufficient documentation

## 2011-09-12 DIAGNOSIS — R197 Diarrhea, unspecified: Secondary | ICD-10-CM | POA: Insufficient documentation

## 2011-09-12 DIAGNOSIS — R0602 Shortness of breath: Secondary | ICD-10-CM | POA: Insufficient documentation

## 2011-09-12 DIAGNOSIS — R6883 Chills (without fever): Secondary | ICD-10-CM | POA: Insufficient documentation

## 2011-09-12 DIAGNOSIS — R351 Nocturia: Secondary | ICD-10-CM | POA: Insufficient documentation

## 2011-09-12 DIAGNOSIS — F32A Depression, unspecified: Secondary | ICD-10-CM | POA: Insufficient documentation

## 2011-09-12 DIAGNOSIS — L659 Nonscarring hair loss, unspecified: Secondary | ICD-10-CM | POA: Insufficient documentation

## 2011-09-12 DIAGNOSIS — R7303 Prediabetes: Secondary | ICD-10-CM | POA: Insufficient documentation

## 2012-01-09 ENCOUNTER — Other Ambulatory Visit (HOSPITAL_COMMUNITY): Payer: Self-pay | Admitting: Internal Medicine

## 2012-01-09 DIAGNOSIS — Z1231 Encounter for screening mammogram for malignant neoplasm of breast: Secondary | ICD-10-CM

## 2012-01-27 ENCOUNTER — Ambulatory Visit (HOSPITAL_COMMUNITY)
Admission: RE | Admit: 2012-01-27 | Discharge: 2012-01-27 | Disposition: A | Discharge status: Home / Self Care | Payer: Medicare Other | Source: Ambulatory Visit | Attending: Internal Medicine | Admitting: Internal Medicine

## 2012-01-27 DIAGNOSIS — Z1231 Encounter for screening mammogram for malignant neoplasm of breast: Secondary | ICD-10-CM | POA: Insufficient documentation

## 2012-03-13 DIAGNOSIS — R933 Abnormal findings on diagnostic imaging of other parts of digestive tract: Secondary | ICD-10-CM | POA: Insufficient documentation

## 2012-11-28 ENCOUNTER — Ambulatory Visit (INDEPENDENT_AMBULATORY_CARE_PROVIDER_SITE_OTHER): Payer: Medicare Other | Admitting: Physician Assistant

## 2012-11-28 ENCOUNTER — Encounter: Payer: Self-pay | Admitting: Physician Assistant

## 2012-11-28 VITALS — BP 150/80 | HR 67 | Ht 62.0 in | Wt 144.0 lb

## 2012-11-28 DIAGNOSIS — I10 Essential (primary) hypertension: Secondary | ICD-10-CM

## 2012-11-28 DIAGNOSIS — Z8679 Personal history of other diseases of the circulatory system: Secondary | ICD-10-CM | POA: Insufficient documentation

## 2012-11-28 DIAGNOSIS — Z8673 Personal history of transient ischemic attack (TIA), and cerebral infarction without residual deficits: Secondary | ICD-10-CM

## 2012-11-28 DIAGNOSIS — Z87898 Personal history of other specified conditions: Secondary | ICD-10-CM

## 2012-11-28 DIAGNOSIS — R002 Palpitations: Secondary | ICD-10-CM | POA: Insufficient documentation

## 2012-11-28 NOTE — Assessment & Plan Note (Signed)
Patient is scheduled for CardioNet heart monitoring for 30 days. We'll see her back in one month with Dr. Allyson Sabal.

## 2012-11-28 NOTE — Addendum Note (Signed)
Addended by: Vincenza Hews on: 11/28/2012 05:53 PM   Modules accepted: Orders

## 2012-11-28 NOTE — Patient Instructions (Signed)
CardioNet we'll continually monitor your heart rhythm. Should you have noticeable palpitations, he can trigger the device to work [employer]. We'll follow up in 30 days or sooner.

## 2012-11-28 NOTE — Assessment & Plan Note (Signed)
Blood pressure is elevated today. Consider adjustment of medications after results of heart monitor are analyzed.

## 2012-11-28 NOTE — Progress Notes (Signed)
Date:  11/28/2012   ID:  [name], DOB 1945/10/06, MRN [id number]  PCP:  Darnelle Bos, MD  Primary Cardiologist:  [name]    History of Present Illness: [name] is a 67 y.o. female is not seen Dr. Allyson Taylor several years. She has history of SVT and PVCs, no hypertension CVA in 2005.  She presented today of having periods of irregular heart rate which she called "bumps". Chest reports dizzy spells lately which last just a split second. Last Friday she noticed a prolonged period of 15-30 seconds where she felt a little off balance. At that time she did not feel like she would actually pass out or have her vision change. She states that she could feel her pulse which was irregular and skipping beats. She denies nausea, vomiting, chest pain, shortness of breath orthopnea, PND, lower extremity edema, diaphoresis. No abdominal pain.  Does report some recent acid reflux.    Wt Readings from Last 3 Encounters:  11/28/12 144 lb (65.318 kg)     Past Medical History  Diagnosis Date  . PVC (premature ventricular contraction)     intermittent  . Syncope and collapse     last friday had alot of dizziness    Current Outpatient Prescriptions  Medication Sig Dispense Refill  . aspirin EC 325 MG tablet Take 325 mg by mouth daily.      Marland Kitchen olmesartan-hydrochlorothiazide (BENICAR HCT) 40-12.5 MG per tablet Take 1 tablet by mouth daily. Pt. Takes 1/2  Tab daily      . verapamil (CALAN) 120 MG tablet Take 120 mg by mouth daily.       No current facility-administered medications for this visit.    Allergies:    Allergies  Allergen Reactions  . Epinephrine   . Contrast Media (Iodinated Diagnostic Agents) Itching    Pt had itchy nose and dry throat and was told she was allergic to IV contrast and does not want to have it.  Marland Kitchen Keflex (Cephalexin)     Social History:  The patient  reports that she has quit smoking. Her smoking use included Cigarettes. She has a 20 pack-year smoking history.  She does not have any smokeless tobacco history on file. She reports that she does not drink alcohol.   ROS:  Please see the history of present illness.  All other systems reviewed and negative.   PHYSICAL EXAM: VS:  BP 150/80  Pulse 67  Ht 5\' 2"  (1.575 m)  Wt 144 lb (65.318 kg)  BMI 26.33 kg/m2 Well nourished, well developed, in no acute distress HEENT: Pupils are equal round react to light accommodation extraocular movements are intact.  Neck: no JVDNo cervical lymphadenopathy. Cardiac: Regular rate and rhythm without murmurs rubs or gallops. Lungs:  clear to auscultation bilaterally, no wheezing, rhonchi or rales Abd: soft, nontender, positive bowel sounds all quadrants, no hepatosplenomegaly Ext: no lower extremity edema.  2+ radial and dorsalis pedis pulses. Skin: warm and dry Neuro:  Grossly normal  EKG:  Normal sinus rhythm rate of 67 beats per minute she does have a mildly prolonged QTC of 475 ms.     ASSESSMENT AND PLAN:  Problem List Items Addressed This Visit   Palpitations     Patient is scheduled for CardioNet heart monitoring for 30 days. We'll see her back in one month with Dr. Allyson Taylor.    History of supraventricular tachycardia   Essential hypertension     Blood pressure is elevated today. Consider adjustment of medications after results of  heart monitor are analyzed.    Relevant Medications      verapamil (CALAN) 120 MG tablet      olmesartan-hydrochlorothiazide (BENICAR HCT) 40-12.5 MG per tablet      aspirin EC 325 MG tablet   History of TIA (transient ischemic attack) 2005    Other Visit Diagnoses   History of palpitations    -  Primary    Relevant Orders       EKG 12-Lead       Cardiac event monitor

## 2012-12-03 ENCOUNTER — Telehealth: Payer: Self-pay | Admitting: Cardiovascular Disease

## 2012-12-03 DIAGNOSIS — Z79899 Other long term (current) drug therapy: Secondary | ICD-10-CM

## 2012-12-03 NOTE — Telephone Encounter (Signed)
Please call-she wants you to order some lab work!

## 2012-12-03 NOTE — Telephone Encounter (Signed)
Returned call.  Pt wants to know if Dr. Allyson Sabal will order a lab test to check her electrolytes.  Stated she doesn't know when the last time she had these done.  Pt c/o eye twitching, muscle aches and dizziness.  Pt stated she saw that long QTs can be caused by an electrolyte deficiency and she wants to get that checked.  Pt also wants to know if long QTs is a rhythm that can be corrected or if it is permanent.  Pt informed RN will notify B. Hager, PA-C r/t her concerns and call her back after a response is given.  Pt informed she has not seen Dr. Allyson Sabal in several years and he will likely decline to order any labs r/t this.  Pt verbalized understanding and agreed w/ plan.    1) Request order for lab test (BMP or CMP and Mg) 2) Can long QTs be corrected?  Message forwarded to B. Leron Croak, PA-C for further instructions.

## 2012-12-04 ENCOUNTER — Telehealth: Payer: Self-pay | Admitting: Physician Assistant

## 2012-12-04 ENCOUNTER — Telehealth: Payer: Self-pay | Admitting: Cardiovascular Disease

## 2012-12-04 NOTE — Telephone Encounter (Signed)
Returned call.  Pt informed B. Leron Croak, PA-C has been notified and RN is awaiting a response.  Pt informed she will be notified as soon as a response is given.  Pt upset r/t having to wait for a response.  RN apologized and informed again that she will be notified as soon as a response is given.  Pt verbalized understanding and agreed w/ plan.

## 2012-12-04 NOTE — Telephone Encounter (Signed)
Labs ordered.  Pt will go to [facility] for lab draw tomorrow morning.

## 2012-12-04 NOTE — Telephone Encounter (Signed)
[name] fis calling about her lab orders and wants to know if they have been sent to the lab so that she may go and have the lab work done.. If you do not reach her at [phone] .Marland Kitchen Please call her on her cell at [phone]   Thanks

## 2012-12-04 NOTE — Telephone Encounter (Signed)
Returned call and informed pt per instructions by MD/PA.  Pt verbalized understanding and agreed w/ plan.  

## 2012-12-04 NOTE — Telephone Encounter (Signed)
Order a BMP and Mag.  QTc is only mildly longer than on prior EKG and can be corrected if any electrolytes are deficient.

## 2012-12-04 NOTE — Telephone Encounter (Signed)
Pt has not heard about order for labwork.

## 2012-12-04 NOTE — Addendum Note (Signed)
Addended by: Beecher Mcardle R on: 12/04/2012 04:34 PM   Modules accepted: Orders

## 2012-12-05 LAB — MAGNESIUM: Magnesium: 2.2 mg/dL (ref 1.5–2.5)

## 2012-12-05 LAB — BASIC METABOLIC PANEL
BUN: 16 mg/dL (ref 6–23)
CO2: 24 mEq/L (ref 19–32)
Calcium: 9.2 mg/dL (ref 8.4–10.5)
Chloride: 106 mEq/L (ref 96–112)
Creat: 0.89 mg/dL (ref 0.50–1.10)
Glucose, Bld: 101 mg/dL — ABNORMAL HIGH (ref 70–99)
Potassium: 4.1 mEq/L (ref 3.5–5.3)
Sodium: 139 mEq/L (ref 135–145)

## 2012-12-06 ENCOUNTER — Telehealth: Payer: Self-pay | Admitting: Physician Assistant

## 2012-12-06 NOTE — Telephone Encounter (Signed)
I called the patient and left a message on her cell phone VM regarding labs collected yesterday.  [name] 10:55 AM

## 2012-12-07 ENCOUNTER — Telehealth: Payer: Self-pay | Admitting: Physician Assistant

## 2012-12-07 NOTE — Telephone Encounter (Signed)
Did you receive the monitor results?  Did you see the long QT's that were present?  If she has the long QT's, do they appear with each heartbeat?

## 2012-12-07 NOTE — Telephone Encounter (Signed)
Returned call.  Pt wants to know if the report from the monitor.  Stated she was told a daily and weekly report is sent.  Pt wants to know the results.  Pt informed daily and weekly reports are sent and she will not be contacted about the reports unless there is an urgent or abnormal report received.  Pt informed the results are usually reviewed after monitoring is complete.  Pt stated she has an appt w/ Dr. Allyson Sabal next week.  Pt advised to write down all questions or concerns she has so that they can be addressed at that appt.  Pt also informed Dr. Allyson Sabal can view the reports that have been received up until her appt to discuss with her next week.  Pt verbalized understanding and agreed w/ plan.  Pt was very thankful to B. Leron Croak, PA-C for calling her and discussing her results.

## 2012-12-11 ENCOUNTER — Emergency Department (HOSPITAL_COMMUNITY): Payer: Medicare Other

## 2012-12-11 ENCOUNTER — Emergency Department (HOSPITAL_COMMUNITY)
Admission: EM | Admit: 2012-12-11 | Discharge: 2012-12-11 | Disposition: A | Discharge status: Home / Self Care | Payer: Medicare Other | Attending: Emergency Medicine | Admitting: Emergency Medicine

## 2012-12-11 ENCOUNTER — Encounter (HOSPITAL_COMMUNITY): Payer: Self-pay | Admitting: Emergency Medicine

## 2012-12-11 DIAGNOSIS — Z79899 Other long term (current) drug therapy: Secondary | ICD-10-CM | POA: Insufficient documentation

## 2012-12-11 DIAGNOSIS — Z7982 Long term (current) use of aspirin: Secondary | ICD-10-CM | POA: Insufficient documentation

## 2012-12-11 DIAGNOSIS — Z87891 Personal history of nicotine dependence: Secondary | ICD-10-CM | POA: Insufficient documentation

## 2012-12-11 DIAGNOSIS — R002 Palpitations: Secondary | ICD-10-CM | POA: Insufficient documentation

## 2012-12-11 DIAGNOSIS — R55 Syncope and collapse: Secondary | ICD-10-CM

## 2012-12-11 DIAGNOSIS — R42 Dizziness and giddiness: Secondary | ICD-10-CM | POA: Insufficient documentation

## 2012-12-11 LAB — CBC WITH DIFFERENTIAL/PLATELET
Basophils Absolute: 0 10*3/uL (ref 0.0–0.1)
Basophils Relative: 0 % (ref 0–1)
Eosinophils Absolute: 0.1 10*3/uL (ref 0.0–0.7)
Eosinophils Relative: 2 % (ref 0–5)
HCT: 36.6 % (ref 36.0–46.0)
Hemoglobin: 12.6 g/dL (ref 12.0–15.0)
Lymphocytes Relative: 24 % (ref 12–46)
Lymphs Abs: 1.2 10*3/uL (ref 0.7–4.0)
MCH: 30.1 pg (ref 26.0–34.0)
MCHC: 34.4 g/dL (ref 30.0–36.0)
MCV: 87.4 fL (ref 78.0–100.0)
Monocytes Absolute: 0.4 10*3/uL (ref 0.1–1.0)
Monocytes Relative: 9 % (ref 3–12)
Neutro Abs: 3.3 10*3/uL (ref 1.7–7.7)
Neutrophils Relative %: 66 % (ref 43–77)
Platelets: 237 10*3/uL (ref 150–400)
RBC: 4.19 MIL/uL (ref 3.87–5.11)
RDW: 12.5 % (ref 11.5–15.5)
WBC: 4.9 10*3/uL (ref 4.0–10.5)

## 2012-12-11 LAB — TROPONIN I: Troponin I: <0.3 ng/mL (ref <0.30)

## 2012-12-11 LAB — COMPREHENSIVE METABOLIC PANEL
ALT: 14 U/L (ref 0–35)
AST: 16 U/L (ref 0–37)
Albumin: 3.9 g/dL (ref 3.5–5.2)
Alkaline Phosphatase: 76 U/L (ref 39–117)
BUN: 14 mg/dL (ref 6–23)
CO2: 25 mEq/L (ref 19–32)
Calcium: 9.5 mg/dL (ref 8.4–10.5)
Chloride: 104 mEq/L (ref 96–112)
Creatinine, Ser: 0.9 mg/dL (ref 0.50–1.10)
GFR calc Af Amer: 74 mL/min — ABNORMAL LOW (ref >90)
GFR calc non Af Amer: 64 mL/min — ABNORMAL LOW (ref >90)
Glucose, Bld: 97 mg/dL (ref 70–99)
Potassium: 3.9 mEq/L (ref 3.5–5.1)
Sodium: 141 mEq/L (ref 135–145)
Total Bilirubin: 0.4 mg/dL (ref 0.3–1.2)
Total Protein: 6.9 g/dL (ref 6.0–8.3)

## 2012-12-11 LAB — T4, FREE: Free T4: 1.22 ng/dL (ref 0.80–1.80)

## 2012-12-11 LAB — URINALYSIS, ROUTINE W REFLEX MICROSCOPIC
Bilirubin Urine: NEGATIVE
Glucose, UA: NEGATIVE mg/dL
Hgb urine dipstick: NEGATIVE
Ketones, ur: NEGATIVE mg/dL
Leukocytes, UA: NEGATIVE
Nitrite: NEGATIVE
Protein, ur: NEGATIVE mg/dL
Specific Gravity, Urine: 1.01 (ref 1.005–1.030)
Urobilinogen, UA: 0.2 mg/dL (ref 0.0–1.0)
pH: 6.5 (ref 5.0–8.0)

## 2012-12-11 LAB — PRO B NATRIURETIC PEPTIDE: Pro B Natriuretic peptide (BNP): 68.3 pg/mL (ref 0–125)

## 2012-12-11 LAB — D-DIMER, QUANTITATIVE: D-Dimer, Quant: 0.44 ug/mL-FEU (ref 0.00–0.48)

## 2012-12-11 LAB — TSH: TSH: 2.337 u[IU]/mL (ref 0.350–4.500)

## 2012-12-11 MED ORDER — HYDROCORTISONE SOD SUCCINATE 100 MG IJ SOLR
200.0000 mg | Freq: Once | INTRAMUSCULAR | Status: AC
  Administered 2012-12-11: 200 mg via INTRAVENOUS
  Filled 2012-12-11: qty 4

## 2012-12-11 MED ORDER — DIPHENHYDRAMINE HCL 50 MG/ML IJ SOLN
50.0000 mg | Freq: Once | INTRAMUSCULAR | Status: AC
  Administered 2012-12-11: 50 mg via INTRAVENOUS
  Filled 2012-12-11: qty 1

## 2012-12-11 MED ORDER — MECLIZINE HCL 25 MG PO TABS
25.0000 mg | ORAL_TABLET | Freq: Once | ORAL | Status: AC
  Administered 2012-12-11: 25 mg via ORAL
  Filled 2012-12-11: qty 1

## 2012-12-11 MED ORDER — MECLIZINE HCL 25 MG PO TABS: 25.0000 mg | ORAL_TABLET | Freq: Four times a day (QID) | ORAL | Status: DC | PRN

## 2012-12-11 MED ORDER — IOHEXOL 350 MG/ML SOLN
100.0000 mL | Freq: Once | INTRAVENOUS | Status: AC | PRN
  Administered 2012-12-11: 100 mL via INTRAVENOUS

## 2012-12-11 NOTE — ED Provider Notes (Signed)
History     CSN: [id number]  Arrival date & time 12/11/12  1111   First MD Initiated Contact with Patient 12/11/12 1112      Chief Complaint  Patient presents with  . Tachycardia  . Dizziness    (Consider location/radiation/quality/duration/timing/severity/associated sxs/prior treatment) HPI  Patient reports she has had a history of SVT and has been treated at the [facility] clinic over 15 years ago for the same. She states they attempted an ablation at that time however they were  not able to reproduce her arrhythmia. She has an appointment to see them again next month. She reports about 2 weeks ago she was talking on the phone with a friend and she acutely felt dizzy like she was going to pass out. She states it made her short of breath but she denied chest pain or diaphoresis. She states it lasted about 30 seconds. She states 3 days later she felt like her heart was beating irregularly in groups. She states she was in bed and was able to fall asleep. She was seen by her cardiologist's PA the next day and was told she had a prolonged QT interval. They did some blood work at that point and put her on a Holter monitor. She removed it yesterday because it was irritating her skin. She reports this morning at about 1040 she felt like her heart was beating fast and irregular and she felt faint. She states she called 911 and tried to put her monitor back in place. She thinks she may have caught the arrhythmia, but EMS told her to leave the monitor at home. She states during the episode she did not have chest pain but she did have tingling in her left little and ring fingers. She denies diaphoresis, nausea, or vomiting. She states she did have a heaviness in the lower portion of her chest that radiated into her back. She denies any swelling or pain in her legs. She states she's been having shortness of breath for the past week and states "the air feels heavy". She states she has been having brief dizzy  spells that last one to 3 seconds for a long time. She also has irregular beats in the past that sound like either PACs or PVCs. She states she has an appointment next week for followup with her cardiologist PA.  PCP Dr. Theressa Millard Cardiologist Dr. Allyson Sabal  Past Medical History  Diagnosis Date  . PVC (premature ventricular contraction)     intermittent  . Syncope and collapse     last friday had alot of dizziness    History reviewed. No pertinent past surgical history.  History reviewed. No pertinent family history.  History  Substance Use Topics  . Smoking status: Former Smoker -- 1.00 packs/day for 20 years    Types: Cigarettes  . Smokeless tobacco: Not on file  . Alcohol Use: No   lives at home Lives with spouse  OB History   Grav Para Term Preterm Abortions TAB SAB Ect Mult Living                  Review of Systems  All other systems reviewed and are negative.    Allergies  Epinephrine; Contrast media; and Keflex  Home Medications   Current Outpatient Rx  Name  Route  Sig  Dispense  Refill  . aspirin EC 325 MG tablet   Oral   Take 325 mg by mouth daily.         Marland Kitchen  olmesartan-hydrochlorothiazide (BENICAR HCT) 40-12.5 MG per tablet   Oral   Take 1 tablet by mouth daily. Pt. Takes 1/2  Tab daily         . verapamil (CALAN) 120 MG tablet   Oral   Take 120 mg by mouth daily.           BP 195/74  Pulse 83  Temp(Src) 98.2 F (36.8 C) (Oral)  Resp 10  SpO2 100%  Vital signs normal except hypertension   Physical Exam  Nursing note and vitals reviewed. Constitutional: She is oriented to person, place, and time. She appears well-developed and well-nourished.  Non-toxic appearance. She does not appear ill. No distress.  Patient felt dizzy while sitting up to have her lungs examined (this was after the prior episode)  HENT:  Head: Normocephalic and atraumatic.  Right Ear: External ear normal.  Left Ear: External ear normal.  Nose: Nose normal.  No mucosal edema or rhinorrhea.  Mouth/Throat: Oropharynx is clear and moist and mucous membranes are normal. No dental abscesses or edematous.  Eyes: Conjunctivae and EOM are normal. Pupils are equal, round, and reactive to light.  Neck: Normal range of motion and full passive range of motion without pain. Neck supple.  Cardiovascular: Normal rate, regular rhythm and normal heart sounds.  Exam reveals no gallop and no friction rub.   No murmur heard. Pulmonary/Chest: Effort normal and breath sounds normal. No respiratory distress. She has no wheezes. She has no rhonchi. She has no rales. She exhibits no tenderness and no crepitus.    Area of discomfort noted  Abdominal: Soft. Normal appearance and bowel sounds are normal. She exhibits no distension. There is no tenderness. There is no rebound and no guarding.  Musculoskeletal: Normal range of motion. She exhibits no edema and no tenderness.  Moves all extremities well.   Neurological: She is alert and oriented to person, place, and time. She has normal strength. No cranial nerve deficit.  Skin: Skin is warm, dry and intact. No rash noted. No erythema. No pallor.  Face is flushed, feet are very pale including soles, with normal color of her palms. She is noted to have large red areas on her chest about 4 cm in size from reaction to the monitor leads  Psychiatric: She has a normal mood and affect. Her speech is normal and behavior is normal. Her mood appears not anxious.    ED Course  Procedures (including critical care time)  Medications  meclizine (ANTIVERT) tablet 25 mg (25 mg Oral Given 12/11/12 1205)  hydrocortisone sodium succinate (SOLU-CORTEF) 100 mg/2 mL injection 200 mg (200 mg Intravenous Given 12/11/12 1411)  diphenhydrAMINE (BENADRYL) injection 50 mg (50 mg Intravenous Given 12/11/12 1410)  iohexol (OMNIPAQUE) 350 MG/ML injection 100 mL (100 mLs Intravenous Contrast Given 12/11/12 1530)   11:40 During our conversation patient  acutely stated she felt dizzy. She denied nausea, vomiting, or palpitations. Her heart rate was 82, her pulse ox was 100% on room air, and her blood pressure was 169/75. The dizziness lasted less than a minute. She had asked to be laid flat during the episode then asked to be sat more upright afterwards. She then describes having a feeling of movement like on a cruise ship.  13:45 discussed results of tests, wants to proceed with CT chest now. Describes she doesn't like the hot flush with the IV contrast but does not mention she has an allergy. Husband has brought her monitor in from home, nurses are calling now  to get it interrogated. She is able to sit up now without feeling dizzy.   13:59 CT tech called, pt has contrast listed as an allergy, will premedicate.   15:50 [facility] called back about her monitor. There was no reading for today. She had has episodes of bradycardia, the lowest was 53 bpm yesterday, on 6/11 she had 11 beats of SVT rate 136.   Pt given results of her CT and her monitor. She was given copies of the report on her monitor. She has an appointment with her cardiologist in 2 days.   Results for orders placed during the hospital encounter of 12/11/12  CBC WITH DIFFERENTIAL      Result Value Range   WBC 4.9  4.0 - 10.5 K/uL   RBC 4.19  3.87 - 5.11 MIL/uL   Hemoglobin 12.6  12.0 - 15.0 g/dL   HCT 45.4  09.8 - 11.9 %   MCV 87.4  78.0 - 100.0 fL   MCH 30.1  26.0 - 34.0 pg   MCHC 34.4  30.0 - 36.0 g/dL   RDW 14.7  82.9 - 56.2 %   Platelets 237  150 - 400 K/uL   Neutrophils Relative % 66  43 - 77 %   Neutro Abs 3.3  1.7 - 7.7 K/uL   Lymphocytes Relative 24  12 - 46 %   Lymphs Abs 1.2  0.7 - 4.0 K/uL   Monocytes Relative 9  3 - 12 %   Monocytes Absolute 0.4  0.1 - 1.0 K/uL   Eosinophils Relative 2  0 - 5 %   Eosinophils Absolute 0.1  0.0 - 0.7 K/uL   Basophils Relative 0  0 - 1 %   Basophils Absolute 0.0  0.0 - 0.1 K/uL  COMPREHENSIVE METABOLIC PANEL      Result Value  Range   Sodium 141  135 - 145 mEq/L   Potassium 3.9  3.5 - 5.1 mEq/L   Chloride 104  96 - 112 mEq/L   CO2 25  19 - 32 mEq/L   Glucose, Bld 97  70 - 99 mg/dL   BUN 14  6 - 23 mg/dL   Creatinine, Ser 1.30  0.50 - 1.10 mg/dL   Calcium 9.5  8.4 - 86.5 mg/dL   Total Protein 6.9  6.0 - 8.3 g/dL   Albumin 3.9  3.5 - 5.2 g/dL   AST 16  0 - 37 U/L   ALT 14  0 - 35 U/L   Alkaline Phosphatase 76  39 - 117 U/L   Total Bilirubin 0.4  0.3 - 1.2 mg/dL   GFR calc non Af Amer 64 (*) >90 mL/min   GFR calc Af Amer 74 (*) >90 mL/min  TROPONIN I      Result Value Range   Troponin I <0.30  <0.30 ng/mL  TSH      Result Value Range   TSH 2.337  0.350 - 4.500 uIU/mL  T4, FREE      Result Value Range   Free T4 1.22  0.80 - 1.80 ng/dL  URINALYSIS, ROUTINE W REFLEX MICROSCOPIC      Result Value Range   Color, Urine YELLOW  YELLOW   APPearance CLEAR  CLEAR   Specific Gravity, Urine 1.010  1.005 - 1.030   pH 6.5  5.0 - 8.0   Glucose, UA NEGATIVE  NEGATIVE mg/dL   Hgb urine dipstick NEGATIVE  NEGATIVE   Bilirubin Urine NEGATIVE  NEGATIVE   Ketones, ur NEGATIVE  NEGATIVE mg/dL   Protein, ur NEGATIVE  NEGATIVE mg/dL   Urobilinogen, UA 0.2  0.0 - 1.0 mg/dL   Nitrite NEGATIVE  NEGATIVE   Leukocytes, UA NEGATIVE  NEGATIVE  PRO B NATRIURETIC PEPTIDE      Result Value Range   Pro B Natriuretic peptide (BNP) 68.3  0 - 125 pg/mL  D-DIMER, QUANTITATIVE      Result Value Range   D-Dimer, Quant 0.44  0.00 - 0.48 ug/mL-FEU   Laboratory interpretation all normal    Results for orders placed in visit on 12/03/12  BASIC METABOLIC PANEL      Result Value Range   Sodium 139  135 - 145 mEq/L   Potassium 4.1  3.5 - 5.3 mEq/L   Chloride 106  96 - 112 mEq/L   CO2 24  19 - 32 mEq/L   Glucose, Bld 101 (*) 70 - 99 mg/dL   BUN 16  6 - 23 mg/dL   Creat 9.60  4.54 - 0.98 mg/dL   Calcium 9.2  8.4 - 11.9 mg/dL  MAGNESIUM      Result Value Range   Magnesium 2.2  1.5 - 2.5 mg/dL   Dg Chest 2 View  1/47/8295    *RADIOLOGY REPORT*  Clinical Data: Tachycardia and shortness of breath  CHEST - 2 VIEW  Comparison:  March 14, 2011  Findings:  There is a focal area of increased opacity in the right base measuring 2.5 x 2.0 cm.  Elsewhere, lungs clear.  Heart size and pulmonary vascularity are normal.  No adenopathy.  There is upper thoracic levoscoliosis and mid thoracic dextroscoliosis.  Conclusion:  Opacity right base.  While this area may represent small focus of pneumonia, a mass must be of concern given this appearance.  In this regard, noncontrast enhanced chest CT is advised.  Elsewhere, lungs are clear.   Original Report Authenticated By: Bretta Bang, M.D.   Ct Angio Chest W/cm &/or Wo Cm  12/11/2012   *RADIOLOGY REPORT*  Clinical Data: Shortness of breath for 1 month.  Abnormal chest radiograph  CT ANGIOGRAPHY CHEST  Technique:  Multidetector CT imaging of the chest using the standard protocol during bolus administration of intravenous contrast. Multiplanar reconstructed images including MIPs were obtained and reviewed to evaluate the vascular anatomy.  Contrast: OMNIPAQUE IOHEXOL 350 MG/ML SOLN  Comparison: Chest radiograph 11/1970 1014, CT 03/18/2011  Findings: There are no filling defects within the pulmonary arteries to suggest acute pulmonary embolism.  No acute findings of the aorta great vessels.  No pericardial fluid.  Esophagus is normal.  Review of the lung parenchyma demonstrates mild atelectasis at the lung bases.  No pneumothorax or infiltrate. There is no mass or pneumonia at the right lung base. Abnormality on chest x-ray may represent pleural fat.  There is no axillary or supraclavicular lymphadenopathy.  No mediastinal or hilar lymphadenopathy.  Limited view of the upper abdomen demonstrates no acute abnormality.  Adrenal glands are normal.  Limited view of the skeleton demonstrates degenerative spurring of the spine.  IMPRESSION:  1.  No evidence of acute pulmonary embolism.  2.  Mild  basilar atelectasis.   Original Report Authenticated By: Genevive Bi, M.D.       Date: 12/11/2012  Rate: 74  Rhythm: normal sinus rhythm  QRS Axis: normal  Intervals: normal  ST/T Wave abnormalities: nonspecific T wave changes  Conduction Disutrbances:none  Narrative Interpretation:   Old EKG Reviewed: changes noted from 11/29/2012 QTIc was 475, now 466  1. Palpitations   2. Near syncope   3. Vertigo     New Prescriptions   MECLIZINE (ANTIVERT) 25 MG TABLET    Take 1 tablet (25 mg total) by mouth 4 (four) times daily as needed for dizziness (or spinning).    Devoria Albe, MD, FACEP   MDM          Ward Givens, MD 12/11/12 [id number]

## 2012-12-11 NOTE — ED Notes (Signed)
Pt alert and mentating appropriately upon d/c. Pt given d/c teaching, prescription, and follow up care instructions. Pt verbalizes understanding and has no further questions upon d/c. Pt ambulatory upon d/c. Pt leaving with husband. NAD noted upon d/c.

## 2012-12-11 NOTE — ED Notes (Signed)
Per eMS: pt has hx of SVT, wearing monitor and HR increased to 110 and pt became anxious; pt states she felt dizzy with HR increases; pt denies chest pain; pt states some SOB; pt alert and oriented; VS BP 175/87 HR 84. 20 L AC

## 2012-12-11 NOTE — ED Notes (Signed)
Pt pulse ox remained 98-100% on RA with ambulation. Pt pulse remained 80-86 with ambulation. Pt denies feeling dizziness when ambulating. Pt ambulated without difficulty.

## 2012-12-11 NOTE — ED Notes (Signed)
Patient transported to CT 

## 2012-12-11 NOTE — ED Notes (Signed)
Patient transported to X-ray 

## 2012-12-11 NOTE — ED Notes (Signed)
Dr.Knapp at bedside  

## 2012-12-13 ENCOUNTER — Ambulatory Visit (INDEPENDENT_AMBULATORY_CARE_PROVIDER_SITE_OTHER): Payer: Medicare Other | Admitting: Cardiovascular Disease

## 2012-12-13 ENCOUNTER — Encounter: Payer: Self-pay | Admitting: Cardiovascular Disease

## 2012-12-13 VITALS — BP 142/70 | HR 72 | Ht 61.0 in | Wt 145.0 lb

## 2012-12-13 DIAGNOSIS — Z8679 Personal history of other diseases of the circulatory system: Secondary | ICD-10-CM

## 2012-12-13 DIAGNOSIS — I10 Essential (primary) hypertension: Secondary | ICD-10-CM

## 2012-12-13 MED ORDER — VERAPAMIL HCL ER 240 MG PO TBCR: 240.0000 mg | EXTENDED_RELEASE_TABLET | Freq: Every day | ORAL | Status: DC

## 2012-12-13 NOTE — Assessment & Plan Note (Signed)
Patient saw Huey Bienenstock [facility] in the office several weeks ago with complaints of presyncope and palpitations. A monitor showed short runs of PSVT however these do not appear to be long enough to cause presyncope. I'm going to increase her verapamil from 122 240 mg a day and have her see Huey Bienenstock back in the office in 4-6 weeks. She also has some symptoms compatible with vertigo and I suggested that she see Dr. Ermalinda Barrios, ENT doctor, for further evaluation.

## 2012-12-13 NOTE — Progress Notes (Signed)
12/13/2012 [name]   1945-12-16  [id number]  Primary Physician Runell Gess, MD Primary Cardiologist: Runell Gess MD Roseanne Reno   HPI:  [name] is a 67 y.o. female who had not seen Dr. Allyson Sabal several years. She has history of SVT and PVCs, hypertension CVA  in 2005. She presented several weeks ago having periods of irregular heart rate which she called "bumps". She reports dizzy spells lately which last just a split second. Last Friday she noticed a prolonged period of 15-30 seconds where she felt a little off balance. At that time she did not feel like she would actually pass out or have her vision change. She also has acid reflux. At that monitor was obtained and showed short runs of PSVT with symptoms that were out of proportion to her arrhythmia.     Current Outpatient Prescriptions  Medication Sig Dispense Refill  . aspirin EC 325 MG tablet Take 325 mg by mouth daily.      . meclizine (ANTIVERT) 25 MG tablet Take 1 tablet (25 mg total) by mouth 4 (four) times daily as needed for dizziness (or spinning).  40 tablet  0  . olmesartan-hydrochlorothiazide (BENICAR HCT) 40-12.5 MG per tablet Take 0.5 tablets by mouth daily.       . verapamil (CALAN) 120 MG tablet Take 120 mg by mouth daily.       No current facility-administered medications for this visit.    Allergies  Allergen Reactions  . Epinephrine   . Contrast Media (Iodinated Diagnostic Agents) Itching    Pt had itchy nose and dry throat and was told she was allergic to IV contrast and does not want to have it.  Marland Kitchen Keflex (Cephalexin)     History   Social History  . Marital Status: Married    Spouse Name: N/A    Number of Children: N/A  . Years of Education: N/A   Occupational History  . Not on file.   Social History Main Topics  . Smoking status: Former Smoker -- 1.00 packs/day for 20 years    Types: Cigarettes  . Smokeless tobacco: Not on file  . Alcohol Use: No  . Drug Use:  Not on file  . Sexually Active: Not on file   Other Topics Concern  . Not on file   Social History Narrative  . No narrative on file     Review of Systems: General: negative for chills, fever, night sweats or weight changes.  Cardiovascular: negative for chest pain, dyspnea on exertion, edema, orthopnea, palpitations, paroxysmal nocturnal dyspnea or shortness of breath Dermatological: negative for rash Respiratory: negative for cough or wheezing Urologic: negative for hematuria Abdominal: negative for nausea, vomiting, diarrhea, bright red blood per rectum, melena, or hematemesis Neurologic: negative for visual changes, syncope, or dizziness All other systems reviewed and are otherwise negative except as noted above.    Blood pressure 160/82, pulse 72, height 5\' 1"  (1.549 m), weight 145 lb (65.772 kg).  General appearance: alert and no distress Neck: no adenopathy, no carotid bruit, no JVD, supple, symmetrical, trachea midline and thyroid not enlarged, symmetric, no tenderness/mass/nodules Lungs: clear to auscultation bilaterally Heart: regular rate and rhythm, S1, S2 normal, no murmur, click, rub or gallop Extremities: extremities normal, atraumatic, no cyanosis or edema  EKG not performed today  ASSESSMENT AND PLAN:   History of supraventricular tachycardia Patient saw Huey Bienenstock [facility] in the office several weeks ago with complaints of presyncope and palpitations. A monitor showed short runs  of PSVT however these do not appear to be long enough to cause presyncope. I'm going to increase her verapamil from 122 240 mg a day and have her see Huey Bienenstock back in the office in 4-6 weeks. She also has some symptoms compatible with vertigo and I suggested that she see Dr. Ermalinda Barrios, ENT doctor, for further evaluation.  Essential hypertension Borderline control on current medications. Will increase her verapamil from 120-240 mg a day      Runell Gess MD [facility],  [facility] 12/13/2012 5:19 PM

## 2012-12-13 NOTE — Assessment & Plan Note (Signed)
Borderline control on current medications. Will increase her verapamil from 120-240 mg a day

## 2012-12-13 NOTE — Patient Instructions (Addendum)
Your physician wants you to follow-up in: 4-6 weeks with Wilburt Finlay PA and 3 months with Dr Allyson Sabal. You will receive a reminder letter in the mail two months in advance. If you don't receive a letter, please call our office to schedule the follow-up appointment.   Increase the Verapamil to 240mg  daily

## 2013-01-10 ENCOUNTER — Telehealth: Payer: Self-pay | Admitting: Cardiovascular Disease

## 2013-01-10 NOTE — Telephone Encounter (Signed)
Okay to use amoxicillin after teeth extraction

## 2013-01-10 NOTE — Telephone Encounter (Signed)
Tomorrow she will have two wisdom teeth removed, and he will give her anesthesia without epinephrine. She has long QT and she will also get an antibiotic and sometimes antibiotics cause long qt. Her question is what will he recommend and is Amoxicillin  ok for her to use afterwards. Please call , may leave message on either home or cell phone .Marland Kitchen Thanks

## 2013-01-10 NOTE — Telephone Encounter (Signed)
Message forwarded to Dr. Allyson Sabal for review and further instructions.  Paper chart# [id number] on cart w/ this note.  Last OV note in Epic.

## 2013-01-10 NOTE — Telephone Encounter (Signed)
Lm for patient that it was fine to proceed with amox tomorrow

## 2013-01-15 ENCOUNTER — Encounter: Payer: Self-pay | Admitting: Physician Assistant

## 2013-01-15 ENCOUNTER — Ambulatory Visit (INDEPENDENT_AMBULATORY_CARE_PROVIDER_SITE_OTHER): Payer: Medicare Other | Admitting: Physician Assistant

## 2013-01-15 VITALS — BP 142/80 | HR 64 | Ht 61.0 in | Wt 144.0 lb

## 2013-01-15 DIAGNOSIS — R002 Palpitations: Secondary | ICD-10-CM

## 2013-01-15 NOTE — Patient Instructions (Signed)
Continue to take verapamil 240mg  daily, either one, 240mg  tablet or two 120mg  tablets.  Followup with Dr. Allyson Sabal in six months.

## 2013-01-15 NOTE — Progress Notes (Signed)
Date:  01/15/2013   ID:  [name], DOB Aug 30, 1945, MRN [id number]  PCP:  Runell Gess, MD  Primary Cardiologist:  Allyson Sabal    History of Present Illness: [name] is a 67 y.o. female  [name] is a 67 y.o. Female with a history of PSVT and PVCs, hypertension CVA in 2005. She presented in June having periods of irregular heart rate which she called "bumps". She reported dizzy spells  which lasted just a split second. Prior to her appt in June she noticed a prolonged period of 15-30 seconds where she felt a little off balance. At that time she did not feel like she would actually pass out or have her vision change. She was placed on a cardionet monitor which recorded several episodes of very short PSVT, rare PACs and PVCs.  She had no prolonged runs which would explain any presyncopal type episodes.  She was seen by Dr. Gery Pray at the 30 day period he  Increase her verapamil to 240 mg daily.  She presents today for followup reports doing quite well. She did have a wisdom tooth removed last Friday.    The patient currently denies nausea, vomiting, fever, chest pain, shortness of breath, orthopnea, dizziness, PND, cough, congestion, abdominal pain, hematochezia, melena, lower extremity edema, claudication.  Wt Readings from Last 3 Encounters:  01/15/13 144 lb (65.318 kg)  12/13/12 145 lb (65.772 kg)  11/28/12 144 lb (65.318 kg)     Past Medical History  Diagnosis Date  . PVC (premature ventricular contraction)     intermittent  . Syncope and collapse     last friday had alot of dizziness  . Palpitations   . Paroxysmal SVT (supraventricular tachycardia)     Current Outpatient Prescriptions  Medication Sig Dispense Refill  . aspirin EC 325 MG tablet Take 325 mg by mouth daily.      . meclizine (ANTIVERT) 25 MG tablet Take 1 tablet (25 mg total) by mouth 4 (four) times daily as needed for dizziness (or spinning).  40 tablet  0  . olmesartan-hydrochlorothiazide  (BENICAR HCT) 40-12.5 MG per tablet Take 0.5 tablets by mouth daily.       . verapamil (CALAN-SR) 120 MG CR tablet Take 120 mg by mouth at bedtime.       No current facility-administered medications for this visit.    Allergies:    Allergies  Allergen Reactions  . Epinephrine   . Contrast Media (Iodinated Diagnostic Agents) Itching    Pt had itchy nose and dry throat and was told she was allergic to IV contrast and does not want to have it.  Marland Kitchen Keflex (Cephalexin)     Social History:  The patient  reports that she has quit smoking. Her smoking use included Cigarettes. She has a 20 pack-year smoking history. She does not have any smokeless tobacco history on file. She reports that she does not drink alcohol.   Family history:  No family history on file.  ROS:  Please see the history of present illness.  All other systems reviewed and negative.   PHYSICAL EXAM: VS:  BP 142/80  Pulse 64  Ht 5\' 1"  (1.549 m)  Wt 144 lb (65.318 kg)  BMI 27.22 kg/m2 Well nourished, well developed, in no acute distress HEENT: Pupils are equal round react to light accommodation extraocular movements are intact.  Neck: No cervical lymphadenopathy. Cardiac: Regular rate and rhythm without murmurs rubs or gallops. Lungs:  clear to auscultation bilaterally, no wheezing, rhonchi  or rales Ext: no lower extremity edema.  2+ radial and dorsalis pedis pulses. Skin: warm and dry Neuro:  Grossly normal    ASSESSMENT AND PLAN:  Problem List Items Addressed This Visit   Palpitations - Primary     A short runs of PSVT seen on CardioNet monitor. With the longest one was 11 beats per was also occasional PAC and PVC. Nothing to explain her occasional symptoms.  We'll continue her on verapamil 240 mg daily.  Follow up in 6 months with Dr. Allyson Sabal.

## 2013-01-15 NOTE — Assessment & Plan Note (Signed)
A short runs of PSVT seen on CardioNet monitor. With the longest one was 11 beats per was also occasional PAC and PVC. Nothing to explain her occasional symptoms.  We'll continue her on verapamil 240 mg daily.  Follow up in 6 months with Dr. Allyson Sabal.

## 2013-01-19 ENCOUNTER — Encounter: Payer: Self-pay | Admitting: Cardiovascular Disease

## 2013-01-30 ENCOUNTER — Other Ambulatory Visit: Payer: Self-pay

## 2013-02-14 ENCOUNTER — Encounter: Payer: Self-pay | Admitting: Neurology

## 2013-02-15 ENCOUNTER — Ambulatory Visit (INDEPENDENT_AMBULATORY_CARE_PROVIDER_SITE_OTHER): Payer: Medicare Other | Admitting: Neurology

## 2013-02-15 ENCOUNTER — Encounter: Payer: Self-pay | Admitting: Neurology

## 2013-02-15 VITALS — BP 127/68 | HR 65 | Ht 61.0 in | Wt 141.0 lb

## 2013-02-15 DIAGNOSIS — Z8673 Personal history of transient ischemic attack (TIA), and cerebral infarction without residual deficits: Secondary | ICD-10-CM

## 2013-02-15 DIAGNOSIS — R002 Palpitations: Secondary | ICD-10-CM

## 2013-02-15 DIAGNOSIS — M4802 Spinal stenosis, cervical region: Secondary | ICD-10-CM | POA: Insufficient documentation

## 2013-02-15 DIAGNOSIS — Z8679 Personal history of other diseases of the circulatory system: Secondary | ICD-10-CM

## 2013-02-15 DIAGNOSIS — R269 Unspecified abnormalities of gait and mobility: Secondary | ICD-10-CM

## 2013-02-15 DIAGNOSIS — I10 Essential (primary) hypertension: Secondary | ICD-10-CM

## 2013-02-15 NOTE — Progress Notes (Signed)
[facility]  PATIENT: [name] DOB: July 25, 1945  HISTORICAL  [name] is a 67 years old right-handed Caucasian female, referred by her primary care physician Dr. Marvis Taylor, and ENT Dr. Jenne Taylor for evaluation of unbalanced episodes  She had past medical history of hypertension, long-standing history of SVT, ablation was attempted in 1999 at [facility] clinic, but there was no clear abberant pathway found, procedure was aborted per patient, she continued to have intermittent tachycardia episode, heart rate racing to 200, she felt lightheaded, going to pass out, lasting less than 1 minute, She has been on beta blocker for extended period of time, but has stopped doing intermittent bradycardia episode, heart rate went down to fifties.  Since June 2014, she had different kind of unbalanced episodes, she had 2 episodes, the first time was in June 2014, she was walking, and talking with her friends on the phone, she suddenly felt unbalanced, she was able to make it to the sofa, sat down, resolved in less than 1 minute, there was no spinning sensation.  The second episode was few weeks later, she was bending down her neck, walking in the kitchen, she suddenly felt unbalanced, lasting less than 1 minute, resolved by sitting down,   she reported a history of cervical canal stenosis, was offered cervical decompression surgery in the past, she also complains of mild gait difficulty since January 2014,  She felt unbalanced, also complains of right knee, left foot pain, she denies bowel and bladder incontinence.    REVIEW OF SYSTEMS: Full 14 system review of systems performed and notable only for palpation, murmur, spinning sensation, snoring, constipation, feeling hot, feeling cold, flushing, joint pain, joint swelling, achy muscles   ALLERGIES: Allergies  Allergen Reactions  . Epinephrine   . Contrast Media [Iodinated Diagnostic Agents] Itching, severe flushing    Pt had  itchy nose and dry throat and was told she was allergic to IV contrast and does not want to have it.  Marland Kitchen Keflex [Cephalexin]     HOME MEDICATIONS: Outpatient Prescriptions Prior to Visit  Medication Sig Dispense Refill  . aspirin EC 325 MG tablet Take 325 mg by mouth daily.      Marland Kitchen olmesartan-hydrochlorothiazide (BENICAR HCT) 40-12.5 MG per tablet Take 0.5 tablets by mouth daily.       . verapamil (CALAN-SR) 120 MG CR tablet Take 120 mg by mouth at bedtime.         PAST MEDICAL HISTORY: Past Medical History  Diagnosis Date  . PVC (premature ventricular contraction), SVT   . Syncope and collapse   . Palpitations   . Paroxysmal SVT (supraventricular tachycardia)   . High blood pressure     PAST SURGICAL HISTORY: Past Surgical History  Procedure Laterality Date  . Abdominal hysterectomy    . Tonsillectomy    . Catheter ablation-cardiac ablation, in 1999, in [facility]      FAMILY HISTORY: Family History  Problem Relation Age of Onset  . High blood pressure Father   . Stroke Father   . Heart Problems Father   . Arthritis Mother   . Neuropathy Mother     SOCIAL HISTORY:  Social History  . Marital Status: Married    Spouse Name: alan    Number of Children: 0  . Years of Education: 12   Occupational History    Retired from [employer]   Social History Main Topics  . Smoking status: Former Smoker -- 1.00 packs/day for 20 years  Types: Cigarettes  . Smokeless tobacco: Never Used     Comment: Quit 40 years ago  . Alcohol Use: No  . Drug Use: No  . Sexual Activity: Not on file    Social History Narrative   Patient lives at home with [name]). Patient is retired. Patient has a high school education.    Caffeine- tea - three cups daily   Right handed.     PHYSICAL EXAM    Filed Vitals:   02/15/13 0819  BP: 127/68  Pulse: 65  Height: 5\' 1"  (1.549 m)  Weight: 141 lb (63.957 kg)     Body mass index is 26.66 kg/(m^2).   Generalized: In  no acute distress  Neck: Supple, no carotid bruits   Cardiac: Regular rate rhythm  Pulmonary: Clear to auscultation bilaterally  Musculoskeletal: No deformity  Neurological examination  Mentation: Alert oriented to time, place, history taking, and causual conversation,  Cranial nerve II-XII: Pupils were equal round reactive to light extraocular movements were full, visual field were full on confrontational test. facial sensation and strength were normal. hearing was intact to finger rubbing bilaterally. Uvula tongue midline.   limited range of motion of her neck turning.  shoulder shrug and were normal and symmetric.Tongue protrusion into cheek strength was normal.  Motor: normal tone, bulk and strength.  Sensory: Intact to fine touch, pinprick, preserved vibratory sensation, and proprioception at toes.  Coordination: Normal finger to nose, heel-to-shin bilaterally there was no truncal ataxia  Gait: Rising up from seated position without assistance, normal stance, without trunk ataxia, moderate stride, good arm swing, smooth turning, able to perform tiptoe, and heel walking without difficulty.   Romberg signs: Negative  Deep tendon reflexes: Brachioradialis 2/2, biceps 2/2, triceps 2/2, patellar 2/2, Achilles 2/2, plantar responses were flexor bilaterally.   DIAGNOSTIC DATA (LABS, IMAGING, TESTING) - I reviewed patient records, labs, notes, testing and imaging myself where available.  Lab Results  Component Value Date   WBC 4.9 12/11/2012   HGB 12.6 12/11/2012   HCT 36.6 12/11/2012   MCV 87.4 12/11/2012   PLT 237 12/11/2012      Component Value Date/Time   NA 141 12/11/2012 1205   K 3.9 12/11/2012 1205   CL 104 12/11/2012 1205   CO2 25 12/11/2012 1205   GLUCOSE 97 12/11/2012 1205   BUN 14 12/11/2012 1205   CREATININE 0.90 12/11/2012 1205   CREATININE 0.89 12/05/2012 0915   CALCIUM 9.5 12/11/2012 1205   PROT 6.9 12/11/2012 1205   ALBUMIN 3.9 12/11/2012 1205   AST 16 12/11/2012 1205    ALT 14 12/11/2012 1205   ALKPHOS 76 12/11/2012 1205   BILITOT 0.4 12/11/2012 1205   GFRNONAA 64* 12/11/2012 1205   GFRAA 74* 12/11/2012 1205   Lab Results  Component Value Date   CHOL 237* 04/07/2011   HDL 43 04/07/2011   LDLCALC 148* 04/07/2011   TRIG 228* 04/07/2011   CHOLHDL 5.5 04/07/2011   Lab Results  Component Value Date   HGBA1C 6.1* 04/06/2011   Lab Results  Component Value Date   VITAMINB12 522 04/07/2011   Lab Results  Component Value Date   TSH 2.337 12/11/2012    ASSESSMENT AND PLAN 67 years old right-handed Caucasian female, with past medical history of SVT, cervical canal stenosis, now presenting with 2 episodes of unbalance, different from her previous tachycardia episodes, there is limited range of motion in her neck, but no hyperreflexia, mild cautious gait.  1. differentiation diagnosis including cervical  canal stenosis, presyncope. 2. proceed with MRI of cervical spine 3. return to clinic in one month.         Levert Feinstein, M.D. Ph.D.  [facility] [address], [address] [place], [place] [address] [phone]

## 2013-03-02 ENCOUNTER — Ambulatory Visit
Admission: RE | Admit: 2013-03-02 | Discharge: 2013-03-02 | Disposition: A | Discharge status: Home / Self Care | Payer: Medicare Other | Source: Ambulatory Visit | Attending: Neurology | Admitting: Neurology

## 2013-03-02 DIAGNOSIS — M4802 Spinal stenosis, cervical region: Secondary | ICD-10-CM

## 2013-03-02 DIAGNOSIS — R002 Palpitations: Secondary | ICD-10-CM

## 2013-03-02 DIAGNOSIS — I10 Essential (primary) hypertension: Secondary | ICD-10-CM

## 2013-03-02 DIAGNOSIS — Z8673 Personal history of transient ischemic attack (TIA), and cerebral infarction without residual deficits: Secondary | ICD-10-CM

## 2013-03-02 DIAGNOSIS — R269 Unspecified abnormalities of gait and mobility: Secondary | ICD-10-CM

## 2013-03-02 DIAGNOSIS — Z8679 Personal history of other diseases of the circulatory system: Secondary | ICD-10-CM

## 2013-03-05 NOTE — Progress Notes (Signed)
Quick Note:  Please call patient multiple level degenerative cervical disease, with mild C5-6 canal stenosis, will go over film on her follow up visit in Oct 3rd. Abnormal MRI scan of the cervical spine showing prominent scoliosis and spondylitic changes from C4-C6 with mild canal stenosis most severe at C5-6. ______

## 2013-03-07 ENCOUNTER — Encounter: Payer: Self-pay | Admitting: Neurology

## 2013-03-07 NOTE — Progress Notes (Signed)
Quick Note:  Left message for patient with MRI cervical results, per Dr. Terrace Arabia. Told patient to call with any questions and that Dr. Terrace Arabia will go over film on her return visit on 03-29-13. ______

## 2013-03-07 NOTE — Telephone Encounter (Signed)
This encounter was created in error - please disregard.

## 2013-03-29 ENCOUNTER — Ambulatory Visit (INDEPENDENT_AMBULATORY_CARE_PROVIDER_SITE_OTHER): Payer: Medicare Other | Admitting: Neurology

## 2013-03-29 ENCOUNTER — Encounter: Payer: Self-pay | Admitting: Neurology

## 2013-03-29 VITALS — BP 139/82 | HR 72 | Wt 141.0 lb

## 2013-03-29 DIAGNOSIS — R269 Unspecified abnormalities of gait and mobility: Secondary | ICD-10-CM

## 2013-03-29 DIAGNOSIS — M4802 Spinal stenosis, cervical region: Secondary | ICD-10-CM

## 2013-03-29 DIAGNOSIS — I10 Essential (primary) hypertension: Secondary | ICD-10-CM

## 2013-03-29 DIAGNOSIS — R002 Palpitations: Secondary | ICD-10-CM

## 2013-03-29 NOTE — Progress Notes (Signed)
[facility]  PATIENT: [name] DOB: 06-18-1946  HISTORICAL  [name] is a 67 years old right-handed Caucasian female, referred by her primary care physician Dr. Marvis Moeller, and ENT Dr. Jenne Pane for evaluation of unbalanced episodes  She had past medical history of hypertension, long-standing history of SVT, ablation was attempted in 1999 at [facility], but there was no clear abberant pathway found, procedure was aborted per patient, she continued to have intermittent tachycardia episode, heart rate racing to 200, she felt lightheaded, going to pass out, lasting less than 1 minute, She has been on beta blocker for extended period of time, but has stopped doing intermittent bradycardia episode, heart rate went down to fifties.  Since June 2014, she had different kind of unbalanced episodes, she had 2 episodes, the first time was in June 2014, she was walking, and talking with her friends on the phone, she suddenly felt unbalanced, she was able to make it to the sofa, sat down, resolved in less than 1 minute, there was no spinning sensation.  The second episode was few weeks later, she was bending down her neck, walking in the kitchen, she suddenly felt unbalanced, lasting less than 1 minute, resolved by sitting down,   She reported a history of cervical canal stenosis, was offered cervical decompression surgery in the past, she also complains of mild gait difficulty since January 2014,  She felt unbalanced, also complains of right knee, left foot pain, she denies bowel and bladder incontinence.  UPDATE Oct 3rd 2014: Since last visit, she had 6 episodes of transient dizziness in one day, lightheaded sensation, one episode happened while she was sitting down, one episode happened while she was just getting up from seated position she denies significant gait difficulty, no bowel bladder incontinence, no neck pain, no radiating pain to her shoulders.  We have  reviewed MRI cervical spine together, C2-3 shows only minor the signal abnormalities. C3-4 shows prominent central disc bulge resulting in mild effacement of the thecal sac but no significant compression. C4-5 shows prominent disc osteophyte protrusion paracentrally to the right resulting in displacement of the cord posteriorly and effacement of the thecal sac and spinal canal narrowing to 8 mm. C5-6 also shows prominent disc osteophyte protrusion paracentrally to the right resulting in displacement of the cord and significant spinal stenosis with AP dimension of 6 mm but no significant foraminal narrowing again. C6-7 hours shows shows asymmetric disc osteophyte protrusion to the right resulting in mild canal narrowing of 8 mm and right-sided foraminal narrowing. C7-T1 shows only minor signal abnormalities of the disc. T1-T2 shows congenital fusion. The spinal cord parenchyma shows normal signal characteristics.     REVIEW OF SYSTEMS: Full 14 system review of systems performed and notable only for palpation, murmur, spinning sensation, snoring, constipation, feeling hot, feeling cold, flushing, joint pain, joint swelling, achy muscles   ALLERGIES: Allergies  Allergen Reactions  . Epinephrine   . Contrast Media [Iodinated Diagnostic Agents] Itching, severe flushing    Pt had itchy nose and dry throat and was told she was allergic to IV contrast and does not want to have it.  Marland Kitchen Keflex [Cephalexin]     HOME MEDICATIONS: Outpatient Prescriptions Prior to Visit  Medication Sig Dispense Refill  . aspirin EC 325 MG tablet Take 325 mg by mouth daily.      Marland Kitchen olmesartan-hydrochlorothiazide (BENICAR HCT) 40-12.5 MG per tablet Take 0.5 tablets by mouth daily.       . verapamil (CALAN-SR)  120 MG CR tablet Take 120 mg by mouth at bedtime.         PAST MEDICAL HISTORY: Past Medical History  Diagnosis Date  . PVC (premature ventricular contraction), SVT   . Syncope and collapse   . Palpitations   .  Paroxysmal SVT (supraventricular tachycardia)   . High blood pressure     PAST SURGICAL HISTORY: Past Surgical History  Procedure Laterality Date  . Abdominal hysterectomy    . Tonsillectomy    . Catheter ablation-cardiac ablation, in 1999, in [facility]      FAMILY HISTORY: Family History  Problem Relation Age of Onset  . High blood pressure Father   . Stroke Father   . Heart Problems Father   . Arthritis Mother   . Neuropathy Mother     SOCIAL HISTORY:  Social History  . Marital Status: Married    Spouse Name: alan    Number of Children: 0  . Years of Education: 12   Occupational History    Retired from [employer]   Social History Main Topics  . Smoking status: Former Smoker -- 1.00 packs/day for 20 years    Types: Cigarettes  . Smokeless tobacco: Never Used     Comment: Quit 40 years ago  . Alcohol Use: No  . Drug Use: No  . Sexual Activity: Not on file    Social History Narrative   Patient lives at home with [name]). Patient is retired. Patient has a high school education.    Caffeine- tea - three cups daily   Right handed.     PHYSICAL EXAM    Filed Vitals:   02/15/13 0819  BP: 127/68  Pulse: 65  Height: 5\' 1"  (1.549 m)  Weight: 141 lb (63.957 kg)     Body mass index is 26.66 kg/(m^2).   Generalized: In no acute distress  Neck: Supple, no carotid bruits   Cardiac: Regular rate rhythm  Pulmonary: Clear to auscultation bilaterally  Musculoskeletal: No deformity  Neurological examination  Mentation: Alert oriented to time, place, history taking, and causual conversation,  Cranial nerve II-XII: Pupils were equal round reactive to light extraocular movements were full, visual field were full on confrontational test. facial sensation and strength were normal. hearing was intact to finger rubbing bilaterally. Uvula tongue midline.   limited range of motion of her neck turning.  shoulder shrug and were normal and  symmetric.Tongue protrusion into cheek strength was normal.  Motor: normal tone, bulk and strength.  Sensory: Intact to fine touch, pinprick, preserved vibratory sensation, and proprioception at toes.  Coordination: Normal finger to nose, heel-to-shin bilaterally there was no truncal ataxia  Gait: Rising up from seated position without assistance, normal stance, without trunk ataxia, moderate stride, good arm swing, smooth turning, able to perform tiptoe, and heel walking without difficulty.   Romberg signs: Negative  Deep tendon reflexes: Brachioradialis 1/1, biceps 2/2, triceps 2/2, patellar 2/2, Achilles 2/2, plantar responses were flexor bilaterally.   DIAGNOSTIC DATA (LABS, IMAGING, TESTING) - I reviewed patient records, labs, notes, testing and imaging myself where available.  Lab Results  Component Value Date   WBC 4.9 12/11/2012   HGB 12.6 12/11/2012   HCT 36.6 12/11/2012   MCV 87.4 12/11/2012   PLT 237 12/11/2012      Component Value Date/Time   NA 141 12/11/2012 1205   K 3.9 12/11/2012 1205   CL 104 12/11/2012 1205   CO2 25 12/11/2012 1205   GLUCOSE 97 12/11/2012 1205  BUN 14 12/11/2012 1205   CREATININE 0.90 12/11/2012 1205   CREATININE 0.89 12/05/2012 0915   CALCIUM 9.5 12/11/2012 1205   PROT 6.9 12/11/2012 1205   ALBUMIN 3.9 12/11/2012 1205   AST 16 12/11/2012 1205   ALT 14 12/11/2012 1205   ALKPHOS 76 12/11/2012 1205   BILITOT 0.4 12/11/2012 1205   GFRNONAA 64* 12/11/2012 1205   GFRAA 74* 12/11/2012 1205   Lab Results  Component Value Date   CHOL 237* 04/07/2011   HDL 43 04/07/2011   LDLCALC 148* 04/07/2011   TRIG 228* 04/07/2011   CHOLHDL 5.5 04/07/2011   Lab Results  Component Value Date   HGBA1C 6.1* 04/06/2011   Lab Results  Component Value Date   VITAMINB12 522 04/07/2011   Lab Results  Component Value Date   TSH 2.337 12/11/2012    ASSESSMENT AND PLAN:  67 years old right-handed Caucasian female, with past medical history of SVT, cervical canal  stenosis, now presenting with 2 episodes of unbalance, different from her previous tachycardia episodes, there is limited range of motion in her neck, but no hyperreflexia, mild cautious gait.  1. Differentiation diagnosis of the reported episode of transient unbalanced sensation includes cardiac arrhythmia, presyncope  2.  Ultrasound of carotid artery .  3.  There is evidence of multilevel cervical degenerative disc disease, mild canal stenosis, but no cord signal changes,        4.  RTC in 6 month, document all events.  Levert Feinstein, M.D. Ph.D.  [facility] [address] [place], [place] [address] [phone]

## 2013-04-04 ENCOUNTER — Telehealth: Payer: Self-pay | Admitting: Neurology

## 2013-04-04 DIAGNOSIS — R269 Unspecified abnormalities of gait and mobility: Secondary | ICD-10-CM

## 2013-04-04 DIAGNOSIS — R42 Dizziness and giddiness: Secondary | ICD-10-CM

## 2013-04-04 DIAGNOSIS — I10 Essential (primary) hypertension: Secondary | ICD-10-CM

## 2013-04-04 DIAGNOSIS — M4802 Spinal stenosis, cervical region: Secondary | ICD-10-CM

## 2013-04-04 NOTE — Telephone Encounter (Signed)
Please call patient, I have ordered MRI brain

## 2013-04-08 ENCOUNTER — Telehealth: Payer: Self-pay | Admitting: *Deleted

## 2013-04-08 NOTE — Telephone Encounter (Signed)
I called [name] and relayed that Dr. Terrace Arabia had sent message about [name] having MRI brain wo contrast.  Order placed and [name] requests [facility]. I Spoke with Luster Landsberg in MRI auth about this.

## 2013-04-09 ENCOUNTER — Ambulatory Visit (INDEPENDENT_AMBULATORY_CARE_PROVIDER_SITE_OTHER): Payer: Medicare Other

## 2013-04-09 DIAGNOSIS — R002 Palpitations: Secondary | ICD-10-CM

## 2013-04-09 DIAGNOSIS — R269 Unspecified abnormalities of gait and mobility: Secondary | ICD-10-CM

## 2013-04-09 DIAGNOSIS — I10 Essential (primary) hypertension: Secondary | ICD-10-CM

## 2013-04-09 DIAGNOSIS — M4802 Spinal stenosis, cervical region: Secondary | ICD-10-CM

## 2013-04-18 ENCOUNTER — Encounter: Payer: Self-pay | Admitting: Neurology

## 2013-04-26 DIAGNOSIS — Z780 Asymptomatic menopausal state: Secondary | ICD-10-CM | POA: Insufficient documentation

## 2013-05-02 ENCOUNTER — Other Ambulatory Visit: Payer: Self-pay

## 2013-05-02 ENCOUNTER — Inpatient Hospital Stay: Admission: RE | Admit: 2013-05-02 | Payer: Medicare Other | Source: Ambulatory Visit

## 2013-07-04 ENCOUNTER — Other Ambulatory Visit: Payer: Self-pay | Admitting: Family Medicine

## 2013-07-04 DIAGNOSIS — R109 Unspecified abdominal pain: Secondary | ICD-10-CM

## 2013-07-04 DIAGNOSIS — I10 Essential (primary) hypertension: Secondary | ICD-10-CM

## 2013-07-11 ENCOUNTER — Other Ambulatory Visit: Payer: Medicare Other

## 2013-07-12 ENCOUNTER — Other Ambulatory Visit: Payer: Medicare Other

## 2013-07-18 ENCOUNTER — Ambulatory Visit
Admission: RE | Admit: 2013-07-18 | Discharge: 2013-07-18 | Disposition: A | Discharge status: Home / Self Care | Payer: Medicare Other | Source: Ambulatory Visit | Attending: Family Medicine | Admitting: Family Medicine

## 2013-07-18 DIAGNOSIS — I10 Essential (primary) hypertension: Secondary | ICD-10-CM

## 2013-07-18 DIAGNOSIS — R109 Unspecified abdominal pain: Secondary | ICD-10-CM

## 2013-07-23 ENCOUNTER — Encounter: Payer: Self-pay | Admitting: Neurology

## 2013-07-23 ENCOUNTER — Telehealth: Payer: Self-pay | Admitting: Neurology

## 2013-07-23 NOTE — Telephone Encounter (Signed)
Left message for patient to reschedule 09/27/13 appointment per Dr. Zannie CoveYan's schedule to 10/10/13, printed and sent letter.

## 2013-08-07 ENCOUNTER — Ambulatory Visit: Payer: Medicare Other | Admitting: Licensed Clinical Social Worker

## 2013-08-07 ENCOUNTER — Other Ambulatory Visit: Payer: Self-pay | Admitting: *Deleted

## 2013-08-07 DIAGNOSIS — K551 Chronic vascular disorders of intestine: Secondary | ICD-10-CM

## 2013-08-27 ENCOUNTER — Encounter: Payer: Self-pay | Admitting: Vascular Surgery

## 2013-08-28 ENCOUNTER — Other Ambulatory Visit (HOSPITAL_COMMUNITY): Payer: Medicare Other

## 2013-08-28 ENCOUNTER — Encounter: Payer: Medicare Other | Admitting: Vascular Surgery

## 2013-09-03 ENCOUNTER — Other Ambulatory Visit: Payer: Self-pay | Admitting: Neurology

## 2013-09-03 DIAGNOSIS — M4802 Spinal stenosis, cervical region: Secondary | ICD-10-CM

## 2013-09-04 ENCOUNTER — Telehealth: Payer: Self-pay | Admitting: Neurology

## 2013-09-04 NOTE — Telephone Encounter (Signed)
Yes, it is ok to call in xanax, she can also come in to pick up samples.

## 2013-09-04 NOTE — Telephone Encounter (Signed)
Patient calling and has scheduled MRI for 09/11/13 and is claustrophobic, is requesting something for anxiety, patient requests Xanax. Please call patient and advise.

## 2013-09-04 NOTE — Telephone Encounter (Signed)
I called and spoke with the patient.  She prefers that the Rx [facility].  [facility] gave verbal [facility] for same dose as our sample packs we give to patients.

## 2013-09-04 NOTE — Telephone Encounter (Signed)
Patient is requesting a Rx for Xanax to take prior to MRI.  Okay to call in Rx?  Please advise,  Thank you.

## 2013-09-11 ENCOUNTER — Ambulatory Visit
Admission: RE | Admit: 2013-09-11 | Discharge: 2013-09-11 | Disposition: A | Discharge status: Home / Self Care | Payer: Medicare Other | Source: Ambulatory Visit | Attending: Neurology | Admitting: Neurology

## 2013-09-11 DIAGNOSIS — M4802 Spinal stenosis, cervical region: Secondary | ICD-10-CM

## 2013-09-11 DIAGNOSIS — R269 Unspecified abnormalities of gait and mobility: Secondary | ICD-10-CM

## 2013-09-13 ENCOUNTER — Telehealth: Payer: Self-pay | Admitting: Neurology

## 2013-09-13 NOTE — Telephone Encounter (Signed)
Called pt to inform her that her MRI of the brain showed age related changes, no change compared to her previous scan in 2012. Pt stated that she would like a copy of the results sent to her in the pt portal and pt also wanted Dr. Terrace ArabiaYan to give her a call. Please advise

## 2013-09-13 NOTE — Telephone Encounter (Signed)
Please call patient, MRI of the brain showed age related changes, no change compared to her previous scan in 2012

## 2013-09-13 NOTE — Telephone Encounter (Signed)
I have called for [name] left message for her cell and home phone.

## 2013-09-27 ENCOUNTER — Ambulatory Visit: Payer: Medicare Other | Admitting: Neurology

## 2013-10-09 ENCOUNTER — Telehealth: Payer: Self-pay | Admitting: Neurology

## 2013-10-09 NOTE — Telephone Encounter (Signed)
Called pt to inform her that her ultrasound of the carotid artery was normal and if she has any other problems, questions or concerns to call the office. Pt verbalized understanding.

## 2013-10-09 NOTE — Telephone Encounter (Signed)
Please call patient, normal ultrasound of carotid artery

## 2013-10-10 ENCOUNTER — Ambulatory Visit (INDEPENDENT_AMBULATORY_CARE_PROVIDER_SITE_OTHER): Payer: Medicare Other | Admitting: Neurology

## 2013-10-10 ENCOUNTER — Encounter: Payer: Self-pay | Admitting: Neurology

## 2013-10-10 VITALS — BP 135/77 | HR 67 | Ht 61.0 in | Wt 130.0 lb

## 2013-10-10 DIAGNOSIS — R42 Dizziness and giddiness: Secondary | ICD-10-CM

## 2013-10-10 DIAGNOSIS — M4802 Spinal stenosis, cervical region: Secondary | ICD-10-CM

## 2013-10-10 DIAGNOSIS — R269 Unspecified abnormalities of gait and mobility: Secondary | ICD-10-CM

## 2013-10-10 DIAGNOSIS — I10 Essential (primary) hypertension: Secondary | ICD-10-CM

## 2013-10-10 NOTE — Progress Notes (Signed)
[facility]  PATIENT: Kristie OmanClaudia Corman DOB: 15-Dec-1945  HISTORICAL  Debarah CrapeClaudia is a 68 years old right-handed Caucasian female, referred by her primary care physician Dr. Marvis MoellerJohnson Berry, and ENT Dr. Jenne PaneBates for evaluation of unbalanced episodes  She had past medical history of hypertension, long-standing history of SVT, ablation [facility] in [facility], but there was no clear abberant pathway found, procedure was aborted per patient, she continued to have intermittent tachycardia episode, heart rate racing to 200, she felt lightheaded, going to pass out, lasting less than 1 minute, She has been on beta blocker for extended period of time, but has stopped due to  intermittent bradycardia episode, heart rate went down to fifties.  Since June 2014, she had different kind of unbalanced episodes, she had 2 episodes, the first time was in June 2014, she was walking, and talking with her friends on the phone, she suddenly felt unbalanced, she was able to make it to the sofa, sat down, resolved in less than 1 minute, there was no spinning sensation.  The second episode was few weeks later, she was bending down her neck, walking in the kitchen, she suddenly felt unbalanced, lasting less than 1 minute, resolved by sitting down,   She reported a history of cervical canal stenosis, was offered cervical decompression surgery in the past, she also complains of mild gait difficulty since January 2014,  She felt unbalanced, also complains of right knee, left foot pain, she denies bowel and bladder incontinence.  UPDATE Oct 3rd 2014: Since last visit, she had 6 episodes of transient dizziness in one day, lightheaded sensation, one episode happened while she was sitting down, one episode happened while she was just getting up from seated position she denies significant gait difficulty, no bowel bladder incontinence, no neck pain, no radiating pain to her shoulders.  We have  reviewed MRI cervical spine together, C2-3 shows only minor the signal abnormalities. C3-4 shows prominent central disc bulge resulting in mild effacement of the thecal sac but no significant compression. C4-5 shows prominent disc osteophyte protrusion paracentrally to the right resulting in displacement of the cord posteriorly and effacement of the thecal sac and spinal canal narrowing to 8 mm. C5-6 also shows prominent disc osteophyte protrusion paracentrally to the right resulting in displacement of the cord and significant spinal stenosis with AP dimension of 6 mm but no significant foraminal narrowing again. C6-7 hours shows shows asymmetric disc osteophyte protrusion to the right resulting in mild canal narrowing of 8 mm and right-sided foraminal narrowing. C7-T1 shows only minor signal abnormalities of the disc. T1-T2 shows congenital fusion. The spinal cord parenchyma shows normal signal characteristics.  UPDATE April 16th 2015: We have reviewed MRI together, which showed mild small vessel disease, no significant change from scan in 2012,  She continues to have intermittent irregular heart rate,   Ultrasound of carotid artery was normal    REVIEW OF SYSTEMS: Full 14 system review of systems performed and notable only for appetite change, chills, unexpected weight change, excessive sweating, neck pain, murmur, clearly intolerance, heat intolerance, apnea, dizziness, tremor, nervousness, anxiety  ALLERGIES: Allergies  Allergen Reactions  . Epinephrine   . Contrast Media [Iodinated Diagnostic Agents] Itching, severe flushing    Pt had itchy nose and dry throat and was told she was allergic to IV contrast and does not want to have it.  Marland Kitchen. Keflex [Cephalexin]     HOME MEDICATIONS: Outpatient Prescriptions Prior to Visit  Medication Sig  Dispense Refill  . aspirin EC 325 MG tablet Take 325 mg by mouth daily.      Marland Kitchen. olmesartan-hydrochlorothiazide (BENICAR HCT) 40-12.5 MG per tablet Take 0.5  tablets by mouth daily.       . verapamil (CALAN-SR) 120 MG CR tablet Take 120 mg by mouth at bedtime.         PAST MEDICAL HISTORY: Past Medical History  Diagnosis Date  . PVC (premature ventricular contraction), SVT   . Syncope and collapse   . Palpitations   . Paroxysmal SVT (supraventricular tachycardia)   . High blood pressure     PAST SURGICAL HISTORY: Past Surgical History  Procedure Laterality Date  . Abdominal hysterectomy    . Tonsillectomy    . Catheter ablation-cardiac ablation, in 1999, in [facility]      FAMILY HISTORY: Family History  Problem Relation Age of Onset  . High blood pressure Father   . Stroke Father   . Heart Problems Father   . Arthritis Mother   . Neuropathy Mother     SOCIAL HISTORY:  Social History  . Marital Status: Married    Spouse Name: alan    Number of Children: 0  . Years of Education: 12   Occupational History    Retired from [employer]   Social History Main Topics  . Smoking status: Former Smoker -- 1.00 packs/day for 20 years    Types: Cigarettes  . Smokeless tobacco: Never Used     Comment: Quit 40 years ago  . Alcohol Use: No  . Drug Use: No  . Sexual Activity: Not on file    Social History Narrative   Patient lives at home with [name](Alan). Patient is retired. Patient has a high school education.    Caffeine- tea - three cups daily   Right handed.     PHYSICAL EXAM    Filed Vitals:   02/15/13 0819  BP: 127/68  Pulse: 65  Height: 5\' 1"  (1.549 m)  Weight: 141 lb (63.957 kg)     Body mass index is 24.58 kg/(m^2).   Generalized: In no acute distress  Neck: Supple, no carotid bruits   Cardiac: Regular rate rhythm  Pulmonary: Clear to auscultation bilaterally  Musculoskeletal: No deformity  Neurological examination  Mentation: Alert oriented to time, place, history taking, and causual conversation,  Cranial nerve II-XII: Pupils were equal round reactive to light extraocular  movements were full, visual field were full on confrontational test. facial sensation and strength were normal. hearing was intact to finger rubbing bilaterally. Uvula tongue midline.   limited range of motion of her neck turning.  shoulder shrug and were normal and symmetric.Tongue protrusion into cheek strength was normal.  Motor: normal tone, bulk and strength.  Sensory: Intact to fine touch, pinprick, preserved vibratory sensation, and proprioception at toes.  Coordination: Normal finger to nose, heel-to-shin bilaterally there was no truncal ataxia  Gait: Rising up from seated position without assistance, normal stance, without trunk ataxia, moderate stride, good arm swing, smooth turning, able to perform tiptoe, and heel walking without difficulty.   Romberg signs: Negative  Deep tendon reflexes: Brachioradialis 1/1, biceps 2/2, triceps 2/2, patellar 2/2, Achilles 2/2, plantar responses were flexor bilaterally.   DIAGNOSTIC DATA (LABS, IMAGING, TESTING) - I reviewed patient records, labs, notes, testing and imaging myself where available.  Lab Results  Component Value Date   WBC 4.9 12/11/2012   HGB 12.6 12/11/2012   HCT 36.6 12/11/2012   MCV 87.4 12/11/2012  PLT 237 12/11/2012      Component Value Date/Time   NA 141 12/11/2012 1205   K 3.9 12/11/2012 1205   CL 104 12/11/2012 1205   CO2 25 12/11/2012 1205   GLUCOSE 97 12/11/2012 1205   BUN 14 12/11/2012 1205   CREATININE 0.90 12/11/2012 1205   CREATININE 0.89 12/05/2012 0915   CALCIUM 9.5 12/11/2012 1205   PROT 6.9 12/11/2012 1205   ALBUMIN 3.9 12/11/2012 1205   AST 16 12/11/2012 1205   ALT 14 12/11/2012 1205   ALKPHOS 76 12/11/2012 1205   BILITOT 0.4 12/11/2012 1205   GFRNONAA 64* 12/11/2012 1205   GFRAA 74* 12/11/2012 1205   Lab Results  Component Value Date   CHOL 237* 04/07/2011   HDL 43 04/07/2011   LDLCALC 148* 04/07/2011   TRIG 228* 04/07/2011   CHOLHDL 5.5 04/07/2011   Lab Results  Component Value Date   HGBA1C 6.1*  04/06/2011   Lab Results  Component Value Date   VITAMINB12 522 04/07/2011   Lab Results  Component Value Date   TSH 2.337 12/11/2012    ASSESSMENT AND PLAN:  68 years old right-handed Caucasian female, with past medical history of SVT, cervical canal stenosis, now presenting with intermittent episodes of unbalance, different from her previous tachycardia episodes, there is limited range of motion in her neck, but no hyperreflexia, mild cautious gait extensive neurological evaluations detailed above, including MRI of the brain, MRI of cervical, ultrasound of carotid artery, failed to demonstrate etiology,  Differentiation diagnosis including cardiac arrhythmia, vs. nonspecific anxiety related body symptoms, continue to observe her symptoms, followup with her cardiologist, only return to clinic if new issues arise  Levert Feinstein, M.D. Ph.D.  [facility] [address], [address] [place], [place] [address] [phone]

## 2013-10-17 NOTE — Telephone Encounter (Signed)
Closed encounter °

## 2014-03-10 DIAGNOSIS — Z4802 Encounter for removal of sutures: Secondary | ICD-10-CM | POA: Insufficient documentation

## 2014-04-21 DIAGNOSIS — Z872 Personal history of diseases of the skin and subcutaneous tissue: Secondary | ICD-10-CM | POA: Insufficient documentation

## 2014-04-21 DIAGNOSIS — L658 Other specified nonscarring hair loss: Secondary | ICD-10-CM | POA: Insufficient documentation

## 2014-09-23 ENCOUNTER — Telehealth: Payer: Self-pay | Admitting: Cardiovascular Disease

## 2014-09-23 NOTE — Telephone Encounter (Signed)
Mrs. [name] is calling because it feels like her heart is stopping and she becomes extremely light headed and she cant feel her pulse and this is the second time it happens.

## 2014-09-23 NOTE — Telephone Encounter (Signed)
Called pt, confirmed she was OK to be seen Friday, added to Dr. Jenene SlickerHochrein's schedule.

## 2014-09-23 NOTE — Telephone Encounter (Signed)
Pt reports longstanding problem w/ SVT. She usually notes a thready but palpable pulse during these events.  Pt reports that 2 times w/in last week she feels like her heart is stopping - feels faint & lightheaded w this, then episode passes. Duration in total, about 6-7 seconds each event. This occurred once last week and once yesterday afternoon. She does not note any particular cause.  Pt notes no syncope, pain, or dyspnea.  Pt would like to be seen - will route to Dr. Allyson SabalBerry to advise. Can add to [facility] or flex calendar this week as warranted.

## 2014-09-26 ENCOUNTER — Encounter: Payer: Self-pay | Admitting: Cardiology

## 2014-09-26 ENCOUNTER — Ambulatory Visit (INDEPENDENT_AMBULATORY_CARE_PROVIDER_SITE_OTHER): Payer: Medicare Other | Admitting: Cardiology

## 2014-09-26 VITALS — BP 130/70 | HR 76 | Ht 61.5 in | Wt 146.7 lb

## 2014-09-26 DIAGNOSIS — E785 Hyperlipidemia, unspecified: Secondary | ICD-10-CM

## 2014-09-26 DIAGNOSIS — Z79899 Other long term (current) drug therapy: Secondary | ICD-10-CM | POA: Diagnosis not present

## 2014-09-26 DIAGNOSIS — R5383 Other fatigue: Secondary | ICD-10-CM

## 2014-09-26 DIAGNOSIS — R002 Palpitations: Secondary | ICD-10-CM

## 2014-09-26 DIAGNOSIS — R079 Chest pain, unspecified: Secondary | ICD-10-CM

## 2014-09-26 MED ORDER — DILTIAZEM HCL 30 MG PO TABS: 30.0000 mg | ORAL_TABLET | Freq: Four times a day (QID) | ORAL | Status: DC

## 2014-09-26 NOTE — Patient Instructions (Signed)
START Cardizem 30mg  twice a day as needed.  Your physician recommends that you return for lab work in: FASTING at [facility].  Your physician has requested that you have an exercise tolerance test. For further information please visit [email or web address] Please also follow instruction sheet, as given.  Your physician recommends that you schedule a follow-up appointment in: AS NEEDED.

## 2014-09-26 NOTE — Progress Notes (Signed)
Cardiology Office Note   Date:  09/26/2014   ID:  Kristie OmanClaudia Hanko, DOB [birth year], MRN [id number]  PCP:  Katy ApoPOLITE,RONALD D, MD  Cardiologist:   Rollene RotundaJames Letica Giaimo, MD   Chief Complaint  Patient presents with  . Palpitations      History of Present Illness: [name] is a 69 y.o. female who presents for evaluation of chest discomfort and palpitations. She had a past history of SVT with an [facility]. She had been on verapamil. However, she noticed that she had dyspnea at night which stopped when she stopped the verapamil a couple of years ago. [facility]  She says she's had negative stress test years ago. I didn't find these. I was able to find an old echocardiogram which was normal. She's been having increasing palpitations and chest discomfort for the last week and a half. This has happened about 3 or 4 times. She describes skipping cause. She'll get lightheaded. It happens at rest. She cannot make it happen. This sensation lasts for 7-8 seconds. She does get chest pressure which she actually passed most of the time. She doesn't exercise but she does activity such as vacuuming. With this she denies any cardiovascular symptoms. She's not had any syncope though she gets lightheaded and feels like she might pass out. She denies any new shortness of breath, PND or orthopnea. She has had no weight gain or edema.  Past Medical History  Diagnosis Date  . PVC (premature ventricular contraction)     intermittent  . Syncope and collapse     last friday had alot of dizziness  . Palpitations   . Paroxysmal SVT (supraventricular tachycardia)   . High blood pressure     Past Surgical History  Procedure Laterality Date  . Abdominal hysterectomy    . Tonsillectomy    . Catheter ablation       Current Outpatient Prescriptions  Medication Sig Dispense Refill  . Cholecalciferol (VITAMIN D3) 2000 UNITS TABS Take 2,000  Units by mouth daily.     No current facility-administered medications for this visit.    Allergies:   Epinephrine; Contrast media; and Keflex    Social History:  The patient  reports that she has quit smoking. Her smoking use included Cigarettes. She has a 20 pack-year smoking history. She has never used smokeless tobacco. She reports that she does not drink alcohol or use illicit drugs.   Family History:  The patient's family history includes Arthritis in her mother; Heart Problems in her father; High blood pressure in her father; Neuropathy in her mother; Stroke in her father.    ROS:  Please see the history of present illness.   Otherwise, review of systems are positive for none.   All other systems are reviewed and negative.    PHYSICAL EXAM: VS:  BP 130/70 mmHg  Pulse 76  Ht 5' 1.5" (1.562 m)  Wt 146 lb 11.2 oz (66.543 kg)  BMI 27.27 kg/m2 , BMI Body mass index is 27.27 kg/(m^2). GENERAL:  Well appearing HEENT:  Pupils equal round and reactive, fundi not visualized, oral mucosa unremarkable NECK:  No jugular venous distention, waveform within normal limits, carotid upstroke brisk and symmetric, no bruits, no thyromegaly LYMPHATICS:  No cervical, inguinal adenopathy LUNGS:  Clear to auscultation bilaterally BACK:  No CVA tenderness CHEST:  Unremarkable HEART:  PMI not displaced or sustained,S1 and S2 within normal limits, no S3, no S4, no  clicks, no rubs, no murmurs ABD:  Flat, positive bowel sounds normal in frequency in pitch, no bruits, no rebound, no guarding, no midline pulsatile mass, no hepatomegaly, no splenomegaly EXT:  2 plus pulses throughout, no edema, no cyanosis no clubbing SKIN:  No rashes no nodules NEURO:  Cranial nerves II through XII grossly intact, motor grossly intact throughout PSYCH:  Cognitively intact, oriented to person place and time    EKG:  EKG is ordered today. The ekg ordered today demonstrates sinus rhythm, rate 76, axis within normal  limits, intervals within normal limits, nonspecific lateral T-wave changes.   Recent Labs: No results found for requested labs within last 365 days.    Lipid Panel    Component Value Date/Time   CHOL 237* 04/07/2011 0605   TRIG 228* 04/07/2011 0605   HDL 43 04/07/2011 0605   CHOLHDL 5.5 04/07/2011 0605   VLDL 46* 04/07/2011 0605   LDLCALC 148* 04/07/2011 0605      Wt Readings from Last 3 Encounters:  09/26/14 146 lb 11.2 oz (66.543 kg)  10/10/13 130 lb (58.968 kg)  03/29/13 141 lb (63.957 kg)      Other studies Reviewed: Additional studies/ records that were reviewed today include: echo from 2005.Marland Kitchen Review of the above records demonstrates:  Please see elsewhere in the note.     ASSESSMENT AND PLAN:  Palpitations:  There is a mention of previous PVCs. I expect she's having PVCs or PACs. I will check electrolytes and a TSH. I will prescribed pill in pocket short release Cardizem when necessary. We discussed at length the mechanism of this problem. If this doesn't help we will consider further therapy.   Chest pain:  This is somewhat atypical. I will bring the patient back for a POET (Plain Old Exercise Test). This will allow me to screen for obstructive coronary disease, risk stratify and very importantly provide a prescription for exercise.  Dyslipidemia:  I will check a lipid profile.  Current medicines are reviewed at length with the patient today.  The patient does not have concerns regarding medicines.  The following changes have been made:  As above  Labs/ tests ordered today include: POET (Plain Old Exercise Treadmill)     Disposition:   FU with 2 months with me.    Signed, Rollene Rotunda, MD  09/26/2014 2:21 PM    [facility]

## 2014-09-30 LAB — LIPID PANEL
Cholesterol: 248 mg/dL — ABNORMAL HIGH (ref 0–200)
HDL: 44 mg/dL — ABNORMAL LOW (ref >=46)
LDL Cholesterol: 154 mg/dL — ABNORMAL HIGH (ref 0–99)
Total CHOL/HDL Ratio: 5.6 Ratio
Triglycerides: 251 mg/dL — ABNORMAL HIGH (ref <150)
VLDL: 50 mg/dL — ABNORMAL HIGH (ref 0–40)

## 2014-09-30 LAB — BASIC METABOLIC PANEL
BUN: 16 mg/dL (ref 6–23)
CO2: 29 mEq/L (ref 19–32)
Calcium: 9.7 mg/dL (ref 8.4–10.5)
Chloride: 104 mEq/L (ref 96–112)
Creat: 0.79 mg/dL (ref 0.50–1.10)
Glucose, Bld: 88 mg/dL (ref 70–99)
Potassium: 4.3 mEq/L (ref 3.5–5.3)
Sodium: 143 mEq/L (ref 135–145)

## 2014-09-30 LAB — TSH: TSH: 2.858 u[IU]/mL (ref 0.350–4.500)

## 2014-09-30 LAB — MAGNESIUM: Magnesium: 2.1 mg/dL (ref 1.5–2.5)

## 2014-10-22 ENCOUNTER — Telehealth (HOSPITAL_COMMUNITY): Payer: Self-pay

## 2014-10-22 NOTE — Telephone Encounter (Signed)
Encounter complete. 

## 2014-10-24 ENCOUNTER — Ambulatory Visit (HOSPITAL_COMMUNITY)
Admission: RE | Admit: 2014-10-24 | Discharge: 2014-10-24 | Disposition: A | Discharge status: Home / Self Care | Payer: Medicare Other | Source: Ambulatory Visit | Attending: Internal Medicine | Admitting: Internal Medicine

## 2014-10-24 DIAGNOSIS — R079 Chest pain, unspecified: Secondary | ICD-10-CM | POA: Diagnosis present

## 2014-10-24 NOTE — Procedures (Signed)
Exercise Treadmill Test  Test  Exercise Tolerance Test Ordering MD: Angelina SheriffJake Milagro Belmares, MD  Interpreting MD:   Unique Test No: 1 Treadmill:  1  Indication for ETT: chest pain - rule out ischemia  Contraindication to ETT: No   Stress Modality: exercise - treadmill  Cardiac Imaging Performed: non   Protocol: standard Bruce - maximal  Max BP:  219/60  Max MPHR (bpm):  150 85% MPR (bpm):  127  MPHR obtained (bpm):  129 % MPHR obtained:  86  Reached 85% MPHR (min:sec):  6:25 Total Exercise Time (min-sec):  6:49  Workload in METS:  8.20 Borg Scale: Not recorded  Reason ETT Terminated:  dyspnea    ST Segment Analysis At Rest: normal ST segments - no evidence of significant ST depression With Exercise: no evidence of significant ST depression  Other Information Arrhythmia:  No Angina during ETT:  absent (0) Quality of ETT:  non-diagnostic  ETT Interpretation:  normal - no evidence of ischemia by ST analysis  Comments: The patient had an moderate exercise tolerance.  There was no chest pain.  There was an appropriate level of dyspnea.  There were no arrhythmias, a normal heart rate response.  There were no ischemic ST T wave changes and a normal heart rate recovery.  He did have a hypertensive BP response.    Recommendations: His BP was elevated with exercise. However, his resting blood pressure has been fine. He can keep an eye on this and her blood pressure diary. Otherwise no workup is planned.

## 2014-10-26 DIAGNOSIS — H532 Diplopia: Secondary | ICD-10-CM

## 2014-10-26 HISTORY — DX: Diplopia: H53.2

## 2014-10-28 LAB — EXERCISE TOLERANCE TEST
Estimated workload: 8.2 METS
Peak HR: 127 {beats}/min
Percent of predicted max HR: 84 %
Stage 1 DBP: 85 mmHg
Stage 1 Grade: 0 %
Stage 1 HR: 85 {beats}/min
Stage 1 SBP: 157 mmHg
Stage 1 Speed: 0 mph
Stage 2 Grade: 0 %
Stage 2 HR: 84 {beats}/min
Stage 2 Speed: 1 mph
Stage 3 Grade: 0.2 %
Stage 3 HR: 85 {beats}/min
Stage 3 Speed: 1 mph
Stage 4 DBP: 70 mmHg
Stage 4 Grade: 10 %
Stage 4 HR: 104 {beats}/min
Stage 4 SBP: 195 mmHg
Stage 4 Speed: 1.7 mph
Stage 5 DBP: 69 mmHg
Stage 5 Grade: 12 %
Stage 5 HR: 122 {beats}/min
Stage 5 SBP: 192 mmHg
Stage 5 Speed: 2.5 mph
Stage 6 Grade: 14 %
Stage 6 HR: 127 {beats}/min
Stage 6 Speed: 3.4 mph
Stage 7 DBP: 60 mmHg
Stage 7 Grade: 0 %
Stage 7 HR: 109 {beats}/min
Stage 7 SBP: 219 mmHg
Stage 7 Speed: 0 mph
Stage 8 DBP: 81 mmHg
Stage 8 Grade: 0 %
Stage 8 HR: 88 {beats}/min
Stage 8 SBP: 152 mmHg
Stage 8 Speed: 0 mph

## 2014-11-13 ENCOUNTER — Encounter (HOSPITAL_COMMUNITY): Payer: Self-pay | Admitting: Emergency Medicine

## 2014-11-13 DIAGNOSIS — H73892 Other specified disorders of tympanic membrane, left ear: Secondary | ICD-10-CM | POA: Diagnosis not present

## 2014-11-13 DIAGNOSIS — I10 Essential (primary) hypertension: Secondary | ICD-10-CM | POA: Insufficient documentation

## 2014-11-13 DIAGNOSIS — H9192 Unspecified hearing loss, left ear: Secondary | ICD-10-CM | POA: Diagnosis not present

## 2014-11-13 DIAGNOSIS — Z79899 Other long term (current) drug therapy: Secondary | ICD-10-CM | POA: Diagnosis not present

## 2014-11-13 DIAGNOSIS — I471 Supraventricular tachycardia: Secondary | ICD-10-CM | POA: Diagnosis not present

## 2014-11-13 DIAGNOSIS — Z87891 Personal history of nicotine dependence: Secondary | ICD-10-CM | POA: Diagnosis not present

## 2014-11-13 DIAGNOSIS — Z8639 Personal history of other endocrine, nutritional and metabolic disease: Secondary | ICD-10-CM | POA: Insufficient documentation

## 2014-11-13 DIAGNOSIS — H9203 Otalgia, bilateral: Secondary | ICD-10-CM | POA: Diagnosis present

## 2014-11-13 LAB — COMPREHENSIVE METABOLIC PANEL
ALT: 27 U/L (ref 14–54)
AST: 23 U/L (ref 15–41)
Albumin: 4.1 g/dL (ref 3.5–5.0)
Alkaline Phosphatase: 69 U/L (ref 38–126)
Anion gap: 10 (ref 5–15)
BUN: 19 mg/dL (ref 6–20)
CO2: 27 mmol/L (ref 22–32)
Calcium: 9.5 mg/dL (ref 8.9–10.3)
Chloride: 104 mmol/L (ref 101–111)
Creatinine, Ser: 0.92 mg/dL (ref 0.44–1.00)
GFR calc Af Amer: >60 mL/min (ref >60)
GFR calc non Af Amer: >60 mL/min (ref >60)
Glucose, Bld: 121 mg/dL — ABNORMAL HIGH (ref 65–99)
Potassium: 3.6 mmol/L (ref 3.5–5.1)
Sodium: 141 mmol/L (ref 135–145)
Total Bilirubin: 0.4 mg/dL (ref 0.3–1.2)
Total Protein: 7.3 g/dL (ref 6.5–8.1)

## 2014-11-13 LAB — CBC WITH DIFFERENTIAL/PLATELET
Basophils Absolute: 0.1 10*3/uL (ref 0.0–0.1)
Basophils Relative: 1 % (ref 0–1)
Eosinophils Absolute: 0.1 10*3/uL (ref 0.0–0.7)
Eosinophils Relative: 2 % (ref 0–5)
HCT: 40.1 % (ref 36.0–46.0)
Hemoglobin: 13.8 g/dL (ref 12.0–15.0)
Lymphocytes Relative: 38 % (ref 12–46)
Lymphs Abs: 2 10*3/uL (ref 0.7–4.0)
MCH: 30.9 pg (ref 26.0–34.0)
MCHC: 34.4 g/dL (ref 30.0–36.0)
MCV: 89.9 fL (ref 78.0–100.0)
Monocytes Absolute: 0.6 10*3/uL (ref 0.1–1.0)
Monocytes Relative: 11 % (ref 3–12)
Neutro Abs: 2.6 10*3/uL (ref 1.7–7.7)
Neutrophils Relative %: 48 % (ref 43–77)
Platelets: 227 10*3/uL (ref 150–400)
RBC: 4.46 MIL/uL (ref 3.87–5.11)
RDW: 12.6 % (ref 11.5–15.5)
WBC: 5.4 10*3/uL (ref 4.0–10.5)

## 2014-11-13 NOTE — ED Notes (Signed)
Pt. woke up this morning with left ear ache with no drainage / denies injury . Hypertensive at triage , pt. stated that she is not taking medications for her hypertension .

## 2014-11-14 ENCOUNTER — Emergency Department (HOSPITAL_COMMUNITY)
Admission: EM | Admit: 2014-11-14 | Discharge: 2014-11-14 | Disposition: A | Discharge status: Home / Self Care | Payer: Medicare Other | Attending: Emergency Medicine | Admitting: Emergency Medicine

## 2014-11-14 DIAGNOSIS — H9192 Unspecified hearing loss, left ear: Secondary | ICD-10-CM | POA: Diagnosis not present

## 2014-11-14 LAB — URINE MICROSCOPIC-ADD ON

## 2014-11-14 LAB — I-STAT TROPONIN, ED: Troponin i, poc: 0 ng/mL (ref 0.00–0.08)

## 2014-11-14 LAB — URINALYSIS, ROUTINE W REFLEX MICROSCOPIC
Bilirubin Urine: NEGATIVE
Glucose, UA: NEGATIVE mg/dL
Ketones, ur: NEGATIVE mg/dL
Leukocytes, UA: NEGATIVE
Nitrite: NEGATIVE
Protein, ur: NEGATIVE mg/dL
Specific Gravity, Urine: 1.008 (ref 1.005–1.030)
Urobilinogen, UA: 0.2 mg/dL (ref 0.0–1.0)
pH: 7 (ref 5.0–8.0)

## 2014-11-14 NOTE — Discharge Instructions (Signed)
Hearing Loss [name], see Dr. Jenne PaneBates for close follow-up of your hearing loss. If any symptoms worsen come back to emergency department immediately. Thank you. A hearing loss is sometimes called deafness. Hearing loss may be partial or total. CAUSES Hearing loss may be caused by:  Wax in the ear canal.  Infection of the ear canal.  Infection of the middle ear.  Trauma to the ear or surrounding area.  Fluid in the middle ear.  A hole in the eardrum (perforated eardrum).  Exposure to loud sounds or music.  Problems with the hearing nerve.  Certain medications. Hearing loss without wax, infection, or a history of injury may mean that the nerve is involved. Hearing loss with severe dizziness, nausea and vomiting or ringing in the ear may suggest a hearing nerve irritation or problems in the middle or inner ear. If hearing loss is untreated, there is a greater likelihood for residual or permanent hearing loss. DIAGNOSIS A hearing test (audiometry) assesses hearing loss. The audiometry test needs to be performed by a hearing specialist (audiologist). TREATMENT Treatment for recent onset of hearing loss may include:  Ear wax removal.  Medications that kill germs (antibiotics).  Cortisone medications.  Prompt follow up with the appropriate specialist. Return of hearing depends on the cause of your hearing loss, so proper medical follow-up is important. Some hearing loss may not be reversible, and a caregiver should discuss care and treatment options with you. SEEK MEDICAL CARE IF:   You have a severe headache, dizziness, or changes in vision.  You have new or increased weakness.  You develop repeated vomiting or other serious medical problems.  You have a fever. Document Released: 06/13/2005 Document Revised: 09/05/2011 Document Reviewed: 10/08/2009 [facility] Patient Information 2015 [place]. This information is not intended to replace advice given to you by your  health care provider. Make sure you discuss any questions you have with your health care provider.

## 2014-11-14 NOTE — ED Provider Notes (Addendum)
CSN: [id number]     Arrival date & time 11/13/14  2229 History   This chart was scribed for Kristie CrumbleAdeleke Kristofor Michalowski, MD by [name], ED Scribe. This patient was seen in room B18C/B18C and the patient's care was started 2:06 AM.    Chief Complaint  Patient presents with  . Otalgia  . Hypertension   The history is provided by the patient. No language interpreter was used.     HPI Comments:  [name] is a 69 y.o. female who presents to the Emergency Department complaining of mild-moderate bilateral otalgia since waking yesterday am. She describes her pain as a pressure and notes pressure is greater in the left ear than the right. She reports associated decreased hearing in the left ear. She denies pressure to jaw, rhinorrhea, fever, cough, CP and SOB. Pt notes she has been around sick contacts with URI types symptoms: cough, congestion.No alleviating factors noted.  Pt with PMHx of HTN; states she stopped taking BP medications "awhile" ago with little change to BP.  Past Medical History  Diagnosis Date  . PVC (premature ventricular contraction)     intermittent  . Syncope and collapse     last friday had alot of dizziness  . Paroxysmal SVT (supraventricular tachycardia)   . High blood pressure   . Dyslipidemia   . Multiple thyroid nodules    Past Surgical History  Procedure Laterality Date  . Abdominal hysterectomy    . Tonsillectomy    . Catheter ablation     Family History  Problem Relation Age of Onset  . High blood pressure Father   . Stroke Father [id number]  . CAD Father [id number]    CABG.  Lived to be 85  . Arthritis Mother   . Neuropathy Mother    History  Substance Use Topics  . Smoking status: Former Smoker -- 0.00 packs/day for 20 years    Types: Cigarettes  . Smokeless tobacco: Never Used     Comment: Quit 40 years ago  . Alcohol Use: No   OB History    No data available     Review of Systems  Constitutional: Negative for fever.  HENT: Positive for ear pain and  hearing loss (Mild; Left ear). Negative for congestion.   Respiratory: Negative for cough and shortness of breath.   Cardiovascular: Negative for chest pain.  All other systems reviewed and are negative.     Allergies  Epinephrine; Contrast media; and Keflex  Home Medications   Prior to Admission medications   Medication Sig Start Date End Date Taking? Authorizing Provider  Cholecalciferol (VITAMIN D3) 2000 UNITS TABS Take 2,000 Units by mouth daily.   Yes Historical Provider, MD  diltiazem (CARDIZEM) 30 MG tablet Take 1 tablet (30 mg total) by mouth 4 (four) times daily. Patient taking differently: Take 30 mg by mouth as needed (for heart).  09/26/14  Yes Kristie RotundaJames Hochrein, MD   BP 175/79 mmHg  Pulse 80  Temp(Src) 97.8 F (36.6 C) (Oral)  Resp 14  SpO2 97% Physical Exam  Constitutional: She is oriented to person, place, and time. She appears well-developed and well-nourished. No distress.  HENT:  Head: Normocephalic and atraumatic.  Right Ear: External ear normal.  Nose: Nose normal.  Mouth/Throat: Oropharynx is clear and moist. No oropharyngeal exudate.  Left TM mild erythema; minimal cerumen seen.   Eyes: Conjunctivae and EOM are normal. Pupils are equal, round, and reactive to light. No scleral icterus.  Neck: Normal range of motion. Neck  supple. No JVD present. No tracheal deviation present. No thyromegaly present.  Cardiovascular: Normal rate, regular rhythm and normal heart sounds.  Exam reveals no gallop and no friction rub.   No murmur heard. Pulmonary/Chest: Effort normal and breath sounds normal. No respiratory distress. She has no wheezes. She exhibits no tenderness.  Abdominal: Soft. Bowel sounds are normal. She exhibits no distension and no mass. There is no tenderness. There is no rebound and no guarding.  Musculoskeletal: Normal range of motion. She exhibits no edema or tenderness.  Lymphadenopathy:    She has no cervical adenopathy.  Neurological: She is alert  and oriented to person, place, and time. No cranial nerve deficit. She exhibits normal muscle tone.  Skin: Skin is warm and dry. No rash noted. No erythema. No pallor.  Nursing note and vitals reviewed.   ED Course  Procedures   DIAGNOSTIC STUDIES:  Oxygen Saturation is 97% on RA, normal by my interpretation.    COORDINATION OF CARE:  2:14 AM Will order troponin to r/o MI. Will discharge with ear drops. Advised pt to keep scheduled ENT appointment with [name]. Discussed treatment plan with pt at bedside and pt agreed to plan.  Labs Review Labs Reviewed  COMPREHENSIVE METABOLIC PANEL - Abnormal; Notable for the following:    Glucose, Bld 121 (*)    All other components within normal limits  URINALYSIS, ROUTINE W REFLEX MICROSCOPIC - Abnormal; Notable for the following:    Hgb urine dipstick TRACE (*)    All other components within normal limits  CBC WITH DIFFERENTIAL/PLATELET  URINE MICROSCOPIC-ADD ON  I-STAT TROPOININ, ED    Imaging Review No results found.   EKG Interpretation None      MDM   Final diagnoses:  None    Patient since emergency department for fullness of her left ear. She states she is having decreased hearing. She denies any pain. Patient was initially very hypertensive, she states this occurs often when she is at high stress. Her blood pressure has returned to 150s over 70s without any intervention. She denies any fevers or upper respiratory symptoms. Physical exam reveals minimal redness in the left side. This is not otitis media, patient does not require antibiotics. She may benefit from outpatient MRI. Patient has follow-up appointment with [name] within the next week. She is advised to keep this appointment for close follow-up. Patient overall appears well, she is in no acute distress, her vital signs were within her normal limits and she is safe for discharge.  Kristie CrumbleAdeleke Rhondalyn Clingan, MD 11/14/14 [id number]  Kristie CrumbleAdeleke Thuan Tippett, MD 11/14/14 [id number]

## 2014-11-14 NOTE — ED Notes (Signed)
Pt states that she feels like there is cotton in her ears, more so in the left, and that it sounds like the ocean is in her ears.

## 2014-11-18 ENCOUNTER — Emergency Department (HOSPITAL_COMMUNITY): Payer: Medicare Other

## 2014-11-18 ENCOUNTER — Emergency Department (HOSPITAL_COMMUNITY)
Admission: EM | Admit: 2014-11-18 | Discharge: 2014-11-18 | Disposition: A | Discharge status: Home / Self Care | Payer: Medicare Other | Attending: Emergency Medicine | Admitting: Emergency Medicine

## 2014-11-18 ENCOUNTER — Encounter (HOSPITAL_COMMUNITY): Payer: Self-pay | Admitting: Emergency Medicine

## 2014-11-18 DIAGNOSIS — H9190 Unspecified hearing loss, unspecified ear: Secondary | ICD-10-CM | POA: Insufficient documentation

## 2014-11-18 DIAGNOSIS — Z79899 Other long term (current) drug therapy: Secondary | ICD-10-CM | POA: Insufficient documentation

## 2014-11-18 DIAGNOSIS — I10 Essential (primary) hypertension: Secondary | ICD-10-CM | POA: Diagnosis not present

## 2014-11-18 DIAGNOSIS — Z8639 Personal history of other endocrine, nutritional and metabolic disease: Secondary | ICD-10-CM | POA: Insufficient documentation

## 2014-11-18 DIAGNOSIS — Z87891 Personal history of nicotine dependence: Secondary | ICD-10-CM | POA: Diagnosis not present

## 2014-11-18 DIAGNOSIS — H9312 Tinnitus, left ear: Secondary | ICD-10-CM | POA: Diagnosis not present

## 2014-11-18 DIAGNOSIS — H81392 Other peripheral vertigo, left ear: Secondary | ICD-10-CM

## 2014-11-18 DIAGNOSIS — R42 Dizziness and giddiness: Secondary | ICD-10-CM | POA: Diagnosis present

## 2014-11-18 LAB — URINALYSIS, ROUTINE W REFLEX MICROSCOPIC
Bilirubin Urine: NEGATIVE
Glucose, UA: NEGATIVE mg/dL
Hgb urine dipstick: NEGATIVE
Ketones, ur: NEGATIVE mg/dL
Leukocytes, UA: NEGATIVE
Nitrite: NEGATIVE
Protein, ur: NEGATIVE mg/dL
Specific Gravity, Urine: 1.02 (ref 1.005–1.030)
Urobilinogen, UA: 0.2 mg/dL (ref 0.0–1.0)
pH: 6 (ref 5.0–8.0)

## 2014-11-18 LAB — I-STAT CHEM 8, ED
BUN: 18 mg/dL (ref 6–20)
Calcium, Ion: 1.18 mmol/L (ref 1.13–1.30)
Chloride: 103 mmol/L (ref 101–111)
Creatinine, Ser: 0.7 mg/dL (ref 0.44–1.00)
Glucose, Bld: 112 mg/dL — ABNORMAL HIGH (ref 65–99)
HCT: 39 % (ref 36.0–46.0)
Hemoglobin: 13.3 g/dL (ref 12.0–15.0)
Potassium: 3.2 mmol/L — ABNORMAL LOW (ref 3.5–5.1)
Sodium: 139 mmol/L (ref 135–145)
TCO2: 20 mmol/L (ref 0–100)

## 2014-11-18 MED ORDER — MECLIZINE HCL 25 MG PO TABS
25.0000 mg | ORAL_TABLET | Freq: Once | ORAL | Status: AC
  Administered 2014-11-18: 25 mg via ORAL
  Filled 2014-11-18: qty 1

## 2014-11-18 MED ORDER — MECLIZINE HCL 12.5 MG PO TABS: 12.5000 mg | ORAL_TABLET | Freq: Three times a day (TID) | ORAL | Status: DC | PRN

## 2014-11-18 MED ORDER — AMLODIPINE BESYLATE 5 MG PO TABS
5.0000 mg | ORAL_TABLET | Freq: Once | ORAL | Status: DC
  Filled 2014-11-18: qty 1

## 2014-11-18 NOTE — ED Notes (Signed)
Patient ambulated approximately [id number] in hallway unassisted with this RN as supervisor, no ataxia noted, reports mild dizziness, but improvement when compared to when she first arrived. Returned to room for PO challenge. Ginger ale provided.

## 2014-11-18 NOTE — ED Notes (Signed)
Patient here with dizziness and roaring in left ear. Brought by EMS from home. Hypertensive with EMS. Recent history of cold like symptoms accompanied by left ear pain. Was seen by physician at that time.

## 2014-11-18 NOTE — ED Notes (Signed)
Patient denies being nauseous after consuming ginger ale.

## 2014-11-18 NOTE — ED Notes (Signed)
Discussed plan of care with Dr. Nicanor AlconPalumbo, patient is to be NPO until ct resulted, no meds for bp.

## 2014-11-18 NOTE — ED Provider Notes (Signed)
CSN: [id number]     Arrival date & time 11/18/14  0006 History  This chart was scribed for Ellison Leisure, MD by Annye Asa, ED Scribe. This patient was seen in room B18C/B18C and the patient's care was started at 12:44 AM.    Chief Complaint  Patient presents with  . Dizziness   Patient is a 69 y.o. female presenting with dizziness. The history is provided by the patient. No language interpreter was used.  Dizziness Description: "My body is spinning" Severity:  Moderate Onset quality:  Sudden Duration:  7 days Timing:  Constant Progression since onset: Acutely worsening tonight. Chronicity:  New Context comment:  Resting comfortably Relieved by:  Nothing Worsened by:  Nothing Ineffective treatments:  None tried Associated symptoms: hearing loss and tinnitus      HPI Comments: Theresia Pree is a right-hand dominant 69 y.o. female who presents to the Emergency Department complaining of sudden onset intense dizziness tonight (patient states, "I felt like I was spinning around to the left side, not the room.") Patient reports she originally had left ear symptoms beginning 5/18 (pressure, ringing, buzzing, muffled hearing). She was seen in the ED on 5/20, at which time her blood pressure was "really high" but her symptoms improved with treatment in the ED. She saw ENT (Dr. Annalee Genta) yesterday who is concerned she might have an ear infection; she is currently on a prednisone taper for this issue but her symptoms continue. Patient reports that tonight, her symptoms worsened suddenly while resting comfortably. She reports mild postnasal drip. No modifying factors noted. She denies fevers, cough, congestion, itchy or watery eyes.   Patient notes prior experience with mild dizziness for which she saw ENT (Dr. Jenne Pane); she cannot recall a specific diagnosis from this visit. She has also seen an audiologist; she has a "large variation" in hearing abilities between both ears (R hearing far better  than L). She states her current symptoms are not similar to her prior experience.   PCP Dr. Trula Slade; she has previously been on blood pressure medications but is not taking any at this time.   Past Medical History  Diagnosis Date  . PVC (premature ventricular contraction)     intermittent  . Syncope and collapse     last friday had alot of dizziness  . Paroxysmal SVT (supraventricular tachycardia)   . High blood pressure   . Dyslipidemia   . Multiple thyroid nodules    Past Surgical History  Procedure Laterality Date  . Abdominal hysterectomy    . Tonsillectomy    . Catheter ablation     Family History  Problem Relation Age of Onset  . High blood pressure Father   . Stroke Father 62  . CAD Father 49    CABG.  Lived to be 85  . Arthritis Mother   . Neuropathy Mother    History  Substance Use Topics  . Smoking status: Former Smoker -- 0.00 packs/day for 20 years    Types: Cigarettes  . Smokeless tobacco: Never Used     Comment: Quit 40 years ago  . Alcohol Use: No   OB History    No data available     Review of Systems  Constitutional: Negative for fever.  HENT: Positive for ear pain, hearing loss, postnasal drip and tinnitus. Negative for congestion.   Eyes: Negative for itching.  Respiratory: Negative for cough.   Neurological: Positive for dizziness.  All other systems reviewed and are negative.  Allergies  Epinephrine; Contrast  media; and Keflex  Home Medications   Prior to Admission medications   Medication Sig Start Date End Date Taking? Authorizing Provider  Cholecalciferol (VITAMIN D3) 2000 UNITS TABS Take 2,000 Units by mouth daily.   Yes Historical Provider, MD  diltiazem (CARDIZEM) 30 MG tablet Take 1 tablet (30 mg total) by mouth 4 (four) times daily. Patient taking differently: Take 30 mg by mouth as needed (for heart).  09/26/14  Yes Rollene RotundaJames Hochrein, MD  predniSONE (DELTASONE) 20 MG tablet Take 10-60 mg by mouth daily with breakfast. TAKE 3 TABS  X3DAYS THEN 2 TABS DAILY X3DAYS THEN 1 TAB X3DAYS THEN 1/2 TAB X3 DAYS 11/14/14  Yes Historical Provider, MD   BP 217/97 mmHg  Pulse 76  Temp(Src) 97.4 F (36.3 C) (Oral)  Resp 16  Ht 5' 1.5" (1.562 m)  Wt 141 lb 8 oz (64.184 kg)  BMI 26.31 kg/m2  SpO2 98% Physical Exam  Constitutional: She is oriented to person, place, and time. She appears well-developed and well-nourished. No distress.  HENT:  Head: Normocephalic and atraumatic.  Right Ear: Tympanic membrane normal.  Left Ear: Tympanic membrane normal.  Mouth/Throat: Oropharynx is clear and moist. No oropharyngeal exudate.  Moist mucous membranes. No fluid behind either TM.   Eyes: EOM are normal. Pupils are equal, round, and reactive to light.  Neck: Normal range of motion. Neck supple. No JVD present.  Cardiovascular: Normal rate, regular rhythm, normal heart sounds and intact distal pulses.  Exam reveals no gallop and no friction rub.   No murmur heard. Pulmonary/Chest: Effort normal and breath sounds normal. No respiratory distress. She has no wheezes. She has no rales.  Abdominal: Soft. Bowel sounds are normal. She exhibits no mass. There is no tenderness. There is no rebound and no guarding.  Musculoskeletal: Normal range of motion. She exhibits no edema.  Moves all extremities normally.   Lymphadenopathy:    She has no cervical adenopathy.  Neurological: She is alert and oriented to person, place, and time. She has normal reflexes. She displays normal reflexes. No cranial nerve deficit. She exhibits normal muscle tone. Coordination normal.  Cranial nerves 2-12 are intact; 5/5 strength in all four extremities.   Skin: Skin is warm and dry. No rash noted.  Psychiatric: She has a normal mood and affect. Her behavior is normal.  Nursing note and vitals reviewed.   ED Course  Procedures   DIAGNOSTIC STUDIES: Oxygen Saturation is 98% on RA, normal by my interpretation.    COORDINATION OF CARE: 12:59 AM Discussed  treatment plan with pt at bedside and pt agreed to plan.  3:35 AM Patient was ambulated with nurse supervision; ambulated well without ataxia. When discussing care plan, patient refused blood pressure medications.    Labs Review Labs Reviewed - No data to display  Imaging Review No results found.   EKG Interpretation None      MDM   Final diagnoses:  None  ambulated normally without ataxia.    Symptoms consistent with peripheral vertigo.  Will treat with meclizine and close follow up with PMD regarding BP and ENT regarding tinnitus.    I personally performed the services described in this documentation, which was scribed in my presence. The recorded information has been reviewed and is accurate.      Cy BlamerApril Julius Boniface, MD 11/18/14 [phone]

## 2014-11-25 DIAGNOSIS — H8102 Meniere's disease, left ear: Secondary | ICD-10-CM | POA: Insufficient documentation

## 2014-11-25 DIAGNOSIS — H9122 Sudden idiopathic hearing loss, left ear: Secondary | ICD-10-CM | POA: Insufficient documentation

## 2014-12-10 ENCOUNTER — Telehealth: Payer: Self-pay | Admitting: Neurology

## 2014-12-10 NOTE — Telephone Encounter (Signed)
Harriett Sine called and requested to refer the patient to be seen for double vision. Patient has been seen by Dr. Terrace Arabia before and is requesting to see her again. Harriett Sine ask that we call and let her know once an appt has been set up with the patient so that she can not it on her records.

## 2014-12-12 ENCOUNTER — Encounter: Payer: Self-pay | Admitting: *Deleted

## 2014-12-12 ENCOUNTER — Ambulatory Visit (INDEPENDENT_AMBULATORY_CARE_PROVIDER_SITE_OTHER): Payer: Medicare Other | Admitting: Diagnostic Neuroimaging

## 2014-12-12 VITALS — BP 142/77 | HR 73 | Ht 61.0 in | Wt 141.0 lb

## 2014-12-12 DIAGNOSIS — R42 Dizziness and giddiness: Secondary | ICD-10-CM

## 2014-12-12 DIAGNOSIS — H532 Diplopia: Secondary | ICD-10-CM

## 2014-12-12 NOTE — Progress Notes (Signed)
[facility]  PATIENT: [name] DOB: 1946/01/25  REFERRING CLINICIAN: Bevis HISTORY FROM: patient and husband  REASON FOR VISIT: new consult    HISTORICAL  CHIEF COMPLAINT:  Chief Complaint  Patient presents with  . Double vision    rm 6, husband, "cannot hear out of left ear x 2 months"    HISTORY OF PRESENT ILLNESS:   69 year old right-handed female here for evaluation of dizziness, double vision, hearing loss, ringing in ears. 11/12/2014 patient had pressure and fullness sensation in her left ear. Within a couple of days she developed significant vertigo attack with nausea, vomiting, balance difficulty. She had decreased hearing in the left ear. She is at intermittent double vision and tilted vision. Patient went to ENT locally as well as at [facility] for evaluation. She was diagnosed with Mnire's disease and prescribed Valium and diuretic which she took for a few days and then stopped. Patient was also recommended to have MRI of the brain, however she canceled this due to concern about this possibly damaging her hearing further.  Patient also went to ophthalmologist for evaluation of double vision. No specific eye related pathology was found. Patient was then referred to me for further evaluation.  No prodromal trauma, infections, triggering factors.  Patient has a history of TIA in the past. She also has history of cervical spinal stenosis. She's had some milder balance problems as far back as 2014 and previously saw my colleague Dr. Terrace Arabia.   REVIEW OF SYSTEMS: Full 14 system review of systems performed and notable only for murmur hearing loss ringing in ears diarrhea spinning sensation double vision feeling hot feeling cold flushing joint pain anxiety dizziness weakness.  ALLERGIES: Allergies  Allergen Reactions  . Epinephrine   . Contrast Media [Iodinated Diagnostic Agents] Itching    Pt had itchy nose and dry throat and was told she was allergic to  IV contrast and does not want to have it.  Marland Kitchen Keflex [Cephalexin] Swelling    HOME MEDICATIONS: Outpatient Prescriptions Prior to Visit  Medication Sig Dispense Refill  . meclizine (ANTIVERT) 12.5 MG tablet Take 1 tablet (12.5 mg total) by mouth 3 (three) times daily as needed for dizziness. 15 tablet 0  . Cholecalciferol (VITAMIN D3) 2000 UNITS TABS Take 2,000 Units by mouth daily.    Marland Kitchen diltiazem (CARDIZEM) 30 MG tablet Take 1 tablet (30 mg total) by mouth 4 (four) times daily. (Patient not taking: Reported on 12/12/2014) 60 tablet 0  . predniSONE (DELTASONE) 20 MG tablet Take 10-60 mg by mouth daily with breakfast. TAKE 3 TABS X3DAYS THEN 2 TABS DAILY X3DAYS THEN 1 TAB X3DAYS THEN 1/2 TAB X3 DAYS  0   No facility-administered medications prior to visit.    PAST MEDICAL HISTORY: Past Medical History  Diagnosis Date  . PVC (premature ventricular contraction)     intermittent  . Syncope and collapse     last friday had alot of dizziness  . Paroxysmal SVT (supraventricular tachycardia)   . High blood pressure   . Dyslipidemia   . Multiple thyroid nodules   . Double vision 10/2014    bilateral  . Depression   . Anxiety     PAST SURGICAL HISTORY: Past Surgical History  Procedure Laterality Date  . Abdominal hysterectomy    . Tonsillectomy    . Catheter ablation      FAMILY HISTORY: Family History  Problem Relation Age of Onset  . High blood pressure Father   . Stroke Father 60  .  CAD Father 1    CABG.  Lived to be 85  . Arthritis Mother   . Neuropathy Mother     SOCIAL HISTORY:  History   Social History  . Marital Status: Married    Spouse Name: [name]  . Number of Children: 0  . Years of Education: 12   Occupational History  .      retired   Social History Main Topics  . Smoking status: Former Smoker -- 0.00 packs/day for 20 years    Types: Cigarettes    Quit date: 12/11/1984  . Smokeless tobacco: Never Used     Comment: Quit 40 years ago  . Alcohol  Use: No  . Drug Use: No  . Sexual Activity: Not on file   Other Topics Concern  . Not on file   Social History Narrative   Patient lives at home with [name]). Patient is retired.      PHYSICAL EXAM  GENERAL EXAM/CONSTITUTIONAL: Vitals:  Filed Vitals:   12/12/14 1117  BP: 142/77  Pulse: 73  Height:  (1.549 m)  Weight: 141 lb (63.957 kg)     Body mass index is 26.66 kg/(m^2).  No exam data present  Patient is in no distress; well developed, nourished and groomed; neck is supple  CARDIOVASCULAR:  Examination of carotid arteries is normal; no carotid bruits  Regular rate and rhythm, no murmurs  Examination of peripheral vascular system by observation and palpation is normal  EYES:  Ophthalmoscopic exam of optic discs and posterior segments is normal; no papilledema or hemorrhages  MUSCULOSKELETAL:  Gait, strength, tone, movements noted in Neurologic exam below  NEUROLOGIC: MENTAL STATUS:  No flowsheet data found.  awake, alert, oriented to person, place and time  recent and remote memory intact  normal attention and concentration  language fluent, comprehension intact, naming intact,   fund of knowledge appropriate  CRANIAL NERVE:   2nd - no papilledema on fundoscopic exam  2nd, 3rd, 4th, 6th - pupils equal and reactive to light, visual fields full to confrontation, extraocular muscles intact, no nystagmus  5th - facial sensation symmetric  7th - facial strength symmetric  8th - hearing intact to whispher  9th - palate elevates symmetrically, uvula midline  11th - shoulder shrug symmetric  12th - tongue protrusion midline  MOTOR:   normal bulk and tone, full strength in the BUE, BLE  SENSORY:   normal and symmetric to light touch, pinprick, temperature, vibration  COORDINATION:   finger-nose-finger, fine finger movements normal  REFLEXES:   deep tendon reflexes TRACE AT BUE AND KNEES; ABSENT AT  ANKLES  GAIT/STATION:   narrow based gait; able to walk on toes, heels; SLIGHT DIFF WITH TANDEM; romberg is negative    DIAGNOSTIC DATA (LABS, IMAGING, TESTING) - I reviewed patient records, labs, notes, testing and imaging myself where available.  Lab Results  Component Value Date   WBC 5.4 11/13/2014   HGB 13.3 11/18/2014   HCT 39.0 11/18/2014   MCV 89.9 11/13/2014   PLT 227 11/13/2014      Component Value Date/Time   NA 139 11/18/2014 0125   K 3.2* 11/18/2014 0125   CL 103 11/18/2014 0125   CO2 27 11/13/2014 2259   GLUCOSE 112* 11/18/2014 0125   BUN 18 11/18/2014 0125   CREATININE 0.70 11/18/2014 0125   CREATININE 0.79 09/29/2014 0959   CALCIUM 9.5 11/13/2014 2259   PROT 7.3 11/13/2014 2259   ALBUMIN 4.1 11/13/2014 2259   AST  23 11/13/2014 2259   ALT 27 11/13/2014 2259   ALKPHOS 69 11/13/2014 2259   BILITOT 0.4 11/13/2014 2259   GFRNONAA >60 11/13/2014 2259   GFRAA >60 11/13/2014 2259   Lab Results  Component Value Date   CHOL 248* 09/29/2014   HDL 44* 09/29/2014   LDLCALC 154* 09/29/2014   TRIG 251* 09/29/2014   CHOLHDL 5.6 09/29/2014   Lab Results  Component Value Date   HGBA1C 6.1* 04/06/2011   Lab Results  Component Value Date   VITAMINB12 522 04/07/2011   Lab Results  Component Value Date   TSH 2.858 09/29/2014    11/18/14 CT head [I reviewed images myself and agree with interpretation. -VRP]  1. No acute findings. 2. Chronic small vessel disease, stable from brain MRI March 2015.  09/12/14 MRI brain [I reviewed images myself and agree with interpretation. -VRP]  1. Mild scattered periventricular and subcortical foci of T2 hyperintensities. These findings are non-specific and considerations include autoimmune, inflammatory, post-infectious, microvascular ischemic or migraine associated etiologies..  2. No significant change from MRI on 04/06/11.    ASSESSMENT AND PLAN  69 y.o. year old female here with intermittent episodes of vertigo,  nausea, vomiting, hearing loss, tinnitus, most likely represents Mnire's disease. Agree with checking MRI of the brain, MRA head and neck to rule out secondary causes.  Ddx: meniere's, labyrinthitis, central vestibulopathy, vertebro-basilar TIA  PLAN:  Orders Placed This Encounter  Procedures  . MR Brain/IAC Wo/W Cm  . MR MRA HEAD WO CONTRAST  . MR Angiogram Neck W Wo Contrast   Return in about 1 month (around 01/11/2015).    Suanne Marker, MD 12/12/2014, 12:28 PM Certified in Neurology, Neurophysiology and Neuroimaging  [facility] [address], [address] [place], [place] [address] [phone]

## 2014-12-25 ENCOUNTER — Ambulatory Visit
Admission: RE | Admit: 2014-12-25 | Discharge: 2014-12-25 | Disposition: A | Discharge status: Home / Self Care | Payer: Medicare Other | Source: Ambulatory Visit | Attending: Diagnostic Neuroimaging | Admitting: Diagnostic Neuroimaging

## 2014-12-25 DIAGNOSIS — H532 Diplopia: Secondary | ICD-10-CM

## 2014-12-25 DIAGNOSIS — R42 Dizziness and giddiness: Secondary | ICD-10-CM

## 2014-12-31 ENCOUNTER — Telehealth: Payer: Self-pay | Admitting: Diagnostic Neuroimaging

## 2014-12-31 NOTE — Telephone Encounter (Signed)
Spoke with patient who states she "picked up the reports on her MRIs yesterday". She is very concerned about the MRA Head which states under "Impressions" :1.  The right middle cerebral artery M2 and M3 branches have severe atherosclerosis and branch irregularities. She is questioning how concerning this finding is, expressing concern about the possibility of a stroke. This RN did inform patient that according to #3 under "Impressions" it states - 3. No change from MRA on 07/27/07.  She states she also continues to have vertigo, "but not as bad as before". She states that due to her kidneys she has had to stop Triamterene hctz. She is taking Valium and Antivert for dizziness but states it comes on very quickly, and she usually vomits before being able to take medication and go to bed.  She states she only gets relief when she is sleeping and also states that most episodes "wake her from her sleep". She then became tearful and asked if Dr Marjory LiesPenumalli would compare the two MRAs and call her asap. Informed her this RN will verbally inform Dr Marjory LiesPenumalli  Of her concerns today. She verbalized understanding, appreciation.

## 2014-12-31 NOTE — Telephone Encounter (Signed)
Patient called and would like to know the results of her MRI. States that she is having vertigo, would like to have a call back as soon as possible.  Please call and advise [phone]

## 2014-12-31 NOTE — Telephone Encounter (Signed)
I called patient and reviewed results. Asymptomatic right MCA stenosis/athero dz, related to HTN and hyperlipidemia. Not related to current symptoms. Relatively stable since 2009.  PLAN: 1. Start aspirin 81mg  daily 2. Follow up with PCP re: HTN and hyperlipidemia  Suanne MarkerVIKRAM R. Correna Meacham, MD 12/31/2014, 5:01 PM Certified in Neurology, Neurophysiology and Neuroimaging  [facility] [address], [address] [place], [place] [address] [phone]

## 2015-01-13 ENCOUNTER — Encounter: Payer: Self-pay | Admitting: Diagnostic Neuroimaging

## 2015-01-13 ENCOUNTER — Ambulatory Visit (INDEPENDENT_AMBULATORY_CARE_PROVIDER_SITE_OTHER): Payer: Medicare Other | Admitting: Diagnostic Neuroimaging

## 2015-01-13 VITALS — BP 159/80 | HR 69 | Ht 61.0 in | Wt 132.0 lb

## 2015-01-13 DIAGNOSIS — M4802 Spinal stenosis, cervical region: Secondary | ICD-10-CM

## 2015-01-13 DIAGNOSIS — R42 Dizziness and giddiness: Secondary | ICD-10-CM | POA: Diagnosis not present

## 2015-01-13 NOTE — Progress Notes (Signed)
[facility]  PATIENT: [name] DOB: May 03, 1946  REFERRING CLINICIAN: Bevis HISTORY FROM: patient and husband  REASON FOR VISIT: follow up     HISTORICAL  CHIEF COMPLAINT:  Chief Complaint  Patient presents with  . Dizziness    rm 7, husband - Hessie Dienerlan, "walking difficulty"  . Follow-up    review MRI, MRA    HISTORY OF PRESENT ILLNESS:   UPDATE 01/13/15: Since last visit, vertigo attacks continue. Tinnitus continues.   PRIOR HPI (12/12/14): 69 year old right-handed female here for evaluation of dizziness, double vision, hearing loss, ringing in ears. 11/12/2014 patient had pressure and fullness sensation in her left ear. Within a couple of days she developed significant vertigo attack with nausea, vomiting, balance difficulty. She had decreased hearing in the left ear. She is at intermittent double vision and tilted vision. Patient went to ENT locally as well as [facility]. She was diagnosed with Mnire's disease and prescribed Valium and diuretic which she took for a few days and then stopped. Patient was also recommended to have MRI of the brain, however she canceled this due to concern about this possibly damaging her hearing further. Patient also went to ophthalmologist for evaluation of double vision. No specific eye related pathology was found. Patient was then referred to me for further evaluation. No prodromal trauma, infections, triggering factors. Patient has a history of TIA in the past. She also has history of cervical spinal stenosis. She's had some milder balance problems as far back as 2014 and previously saw my colleague Dr. Terrace ArabiaYan.   REVIEW OF SYSTEMS: Full 14 system review of systems performed and notable only for murmur hearing loss ringing in ears diarrhea spinning sensation double vision feeling hot feeling cold flushing joint pain anxiety dizziness weakness.  ALLERGIES: Allergies  Allergen Reactions  . Epinephrine   . Contrast  Media [Iodinated Diagnostic Agents] Itching    Pt had itchy nose and dry throat and was told she was allergic to IV contrast and does not want to have it.  Marland Kitchen. Keflex [Cephalexin] Swelling    HOME MEDICATIONS: Outpatient Prescriptions Prior to Visit  Medication Sig Dispense Refill  . diazepam (VALIUM) 5 MG tablet Take 5 mg by mouth every 8 (eight) hours as needed (dizziness).    . meclizine (ANTIVERT) 12.5 MG tablet Take 1 tablet (12.5 mg total) by mouth 3 (three) times daily as needed for dizziness. 15 tablet 0  . Cholecalciferol (VITAMIN D3) 2000 UNITS TABS Take 2,000 Units by mouth daily.    Marland Kitchen. diltiazem (CARDIZEM) 30 MG tablet Take 1 tablet (30 mg total) by mouth 4 (four) times daily. (Patient not taking: Reported on 12/12/2014) 60 tablet 0   No facility-administered medications prior to visit.    PAST MEDICAL HISTORY: Past Medical History  Diagnosis Date  . PVC (premature ventricular contraction)     intermittent  . Syncope and collapse     last friday had alot of dizziness  . Paroxysmal SVT (supraventricular tachycardia)   . High blood pressure   . Dyslipidemia   . Multiple thyroid nodules   . Double vision 10/2014    bilateral  . Depression   . Anxiety     PAST SURGICAL HISTORY: Past Surgical History  Procedure Laterality Date  . Abdominal hysterectomy    . Tonsillectomy    . Catheter ablation      FAMILY HISTORY: Family History  Problem Relation Age of Onset  . High blood pressure Father   . Stroke  Father 62  . CAD Father 39    CABG.  Lived to be 85  . Arthritis Mother   . Neuropathy Mother     SOCIAL HISTORY:  History   Social History  . Marital Status: Married    Spouse Name: [name]  . Number of Children: 0  . Years of Education: 12   Occupational History  .      retired   Social History Main Topics  . Smoking status: Former Smoker -- 0.00 packs/day for 20 years    Types: Cigarettes    Quit date: 12/11/1984  . Smokeless tobacco: Never Used      Comment: Quit 40 years ago  . Alcohol Use: No  . Drug Use: No  . Sexual Activity: Not on file   Other Topics Concern  . Not on file   Social History Narrative   Patient lives at home with [name]). Patient is retired.      PHYSICAL EXAM  GENERAL EXAM/CONSTITUTIONAL: Vitals:  Filed Vitals:   01/13/15 1246  BP: 159/80  Pulse: 69  Height: 5\' 1"  (1.549 m)  Weight: 132 lb (59.875 kg)   Body mass index is 24.95 kg/(m^2). No exam data present  Patient is in no distress; well developed, nourished and groomed; neck is supple  CARDIOVASCULAR:  Examination of carotid arteries is normal; no carotid bruits  Regular rate and rhythm, no murmurs  Examination of peripheral vascular system by observation and palpation is normal  EYES:  Ophthalmoscopic exam of optic discs and posterior segments is normal; no papilledema or hemorrhages  MUSCULOSKELETAL:  Gait, strength, tone, movements noted in Neurologic exam below  NEUROLOGIC: MENTAL STATUS:  No flowsheet data found.  awake, alert, oriented to person, place and time  recent and remote memory intact  normal attention and concentration  language fluent, comprehension intact, naming intact,   fund of knowledge appropriate  CRANIAL NERVE:   2nd - no papilledema on fundoscopic exam  2nd, 3rd, 4th, 6th - pupils equal and reactive to light, visual fields full to confrontation, extraocular muscles intact, no nystagmus  5th - facial sensation symmetric  7th - facial strength symmetric  8th - hearing intact to whispher  9th - palate elevates symmetrically, uvula midline  11th - shoulder shrug symmetric  12th - tongue protrusion midline  MOTOR:   normal bulk and tone, full strength in the BUE, BLE  SENSORY:   normal and symmetric to light touch, pinprick, temperature, vibration  COORDINATION:   finger-nose-finger, fine finger movements normal  REFLEXES:   deep tendon reflexes TRACE AT BUE AND  KNEES; ABSENT AT ANKLES  GAIT/STATION:   narrow based gait; able to walk on toes, heels; SLIGHT DIFF WITH TANDEM; romberg is negative    DIAGNOSTIC DATA (LABS, IMAGING, TESTING) - I reviewed patient records, labs, notes, testing and imaging myself where available.  Lab Results  Component Value Date   WBC 5.4 11/13/2014   HGB 13.3 11/18/2014   HCT 39.0 11/18/2014   MCV 89.9 11/13/2014   PLT 227 11/13/2014      Component Value Date/Time   NA 139 11/18/2014 0125   K 3.2* 11/18/2014 0125   CL 103 11/18/2014 0125   CO2 27 11/13/2014 2259   GLUCOSE 112* 11/18/2014 0125   BUN 18 11/18/2014 0125   CREATININE 0.70 11/18/2014 0125   CREATININE 0.79 09/29/2014 0959   CALCIUM 9.5 11/13/2014 2259   PROT 7.3 11/13/2014 2259   ALBUMIN 4.1 11/13/2014 2259  AST 23 11/13/2014 2259   ALT 27 11/13/2014 2259   ALKPHOS 69 11/13/2014 2259   BILITOT 0.4 11/13/2014 2259   GFRNONAA >60 11/13/2014 2259   GFRAA >60 11/13/2014 2259   Lab Results  Component Value Date   CHOL 248* 09/29/2014   HDL 44* 09/29/2014   LDLCALC 154* 09/29/2014   TRIG 251* 09/29/2014   CHOLHDL 5.6 09/29/2014   Lab Results  Component Value Date   HGBA1C 6.1* 04/06/2011   Lab Results  Component Value Date   VITAMINB12 522 04/07/2011   Lab Results  Component Value Date   TSH 2.858 09/29/2014   03/04/13 MRI cervical spine  - prominent scoliosis and spondylitic changes from C4-C6 with mild canal stenosis most severe at C5-6.  11/18/14 CT head 1. No acute findings. 2. Chronic small vessel disease, stable from brain MRI March 2015.  09/12/14 MRI brain 1. Mild scattered periventricular and subcortical foci of T2 hyperintensities. These findings are non-specific and considerations include autoimmune, inflammatory, post-infectious, microvascular ischemic or migraine associated etiologies..  2. No significant change from MRI on 04/06/11.  12/25/14 MRI brain  1. Scattered periventricular and subcortical foci of  non-specific gliosis. These findings are non-specific and considerations include autoimmune, inflammatory, post-infectious, microvascular ischemic or migraine associated etiologies.  2. No acute findings. 3. No change from MRI on 09/11/13.  12/25/14 MRA head  1. The right middle cerebral artery M2 and M3 branches have severe atherosclerosis and branch irregularities.  2. Remaining medium-large sized vessels are unremarkable. 3. No change from MRA on 07/27/07.  12/25/14 MRA neck  1. The bilateral external carotid arteries have focal stenosis at the carotid bulbs.  2. Bilateral ICA and vertebral arteries are unremarkable.    ASSESSMENT AND PLAN  69 y.o. year old female here with intermittent episodes of vertigo, nausea, vomiting, hearing loss, tinnitus, most likely represents Mnire's disease.   Dx: meniere's + cervical spine stenosis  PLAN: - MRI cervical spine - vascular risk factor mgmt (HTN, hyperlipidemia, aspirin) - vestibular PT  Orders Placed This Encounter  Procedures  . PT vestibular rehab   Return in about 3 months (around 04/15/2015).    Suanne Marker, MD 01/13/2015, 1:13 PM Certified in Neurology, Neurophysiology and Neuroimaging  [facility] [address] [place], [place] [address] [phone]

## 2015-01-20 ENCOUNTER — Telehealth: Payer: Self-pay

## 2015-01-20 DIAGNOSIS — R42 Dizziness and giddiness: Secondary | ICD-10-CM

## 2015-01-20 NOTE — Telephone Encounter (Signed)
Pt called in requesting to know the status of Vestibular Rehab order. Advised would fwd to Dr. Demetrius Charity to confirm. Patient verbalized understanding.

## 2015-01-22 ENCOUNTER — Ambulatory Visit
Admission: RE | Admit: 2015-01-22 | Discharge: 2015-01-22 | Disposition: A | Discharge status: Home / Self Care | Payer: Medicare Other | Source: Ambulatory Visit | Attending: Diagnostic Neuroimaging | Admitting: Diagnostic Neuroimaging

## 2015-01-22 DIAGNOSIS — M4802 Spinal stenosis, cervical region: Secondary | ICD-10-CM

## 2015-02-16 ENCOUNTER — Telehealth: Payer: Self-pay | Admitting: *Deleted

## 2015-02-16 NOTE — Telephone Encounter (Signed)
Patient would like to know about the vestibular rehab orders. She has not heard a word from anyone and would like an appointment for this. She called back in July regarding this but no return call. Best number [phone].

## 2015-02-17 ENCOUNTER — Other Ambulatory Visit: Payer: Self-pay

## 2015-02-17 DIAGNOSIS — R42 Dizziness and giddiness: Secondary | ICD-10-CM

## 2015-02-17 NOTE — Telephone Encounter (Signed)
Patient returned [name]'s call. I relayed message to patient.

## 2015-02-17 NOTE — Telephone Encounter (Signed)
LVM for pt to call office; ok to inform her that her Vestibular Rehab referral has been sent to [facility] ([facility])  She will hear from their office with an appointment.

## 2015-02-23 ENCOUNTER — Ambulatory Visit: Payer: Medicare Other | Attending: Diagnostic Neuroimaging | Admitting: Physical Therapy

## 2015-02-23 DIAGNOSIS — R2681 Unsteadiness on feet: Secondary | ICD-10-CM | POA: Insufficient documentation

## 2015-02-23 DIAGNOSIS — R42 Dizziness and giddiness: Secondary | ICD-10-CM

## 2015-02-23 DIAGNOSIS — R269 Unspecified abnormalities of gait and mobility: Secondary | ICD-10-CM | POA: Diagnosis present

## 2015-02-24 NOTE — Therapy (Signed)
[facility] [address] [place], [place], [address] Phone: [phone]   Fax:  [phone]  Physical Therapy Evaluation  Patient Details  Name: [name] MRN: [id number] Date of Birth: [birth year] Referring Provider:  Suanne Marker, MD  Encounter Date: 02/23/2015      PT End of Session - 02/24/15 0923    Visit Number 1   Number of Visits 17   Date for PT Re-Evaluation 03/25/15   Authorization Type Medicare   Authorization Time Period 02-23-15 - 04-24-15   PT Start Time 0845   PT Stop Time 0933   PT Time Calculation (min) 48 min      Past Medical History  Diagnosis Date  . PVC (premature ventricular contraction)     intermittent  . Syncope and collapse     last friday had alot of dizziness  . Paroxysmal SVT (supraventricular tachycardia)   . High blood pressure   . Dyslipidemia   . Multiple thyroid nodules   . Double vision 10/2014    bilateral  . Depression   . Anxiety     Past Surgical History  Procedure Laterality Date  . Abdominal hysterectomy    . Tonsillectomy    . Catheter ablation      There were no vitals filed for this visit.  Visit Diagnosis:  Dizziness and giddiness - Plan: PT plan of care cert/re-cert  Abnormality of gait - Plan: PT plan of care cert/re-cert  Unsteadiness - Plan: PT plan of care cert/re-cert      Subjective Assessment - 02/24/15 0855    Subjective Diagnosed with Meniere's disease in May 2016 by ENT at [facility] - went to see Dr. Delfino Lovett at [facility] for 2nd opinion -pt states first attack occurred with a loud noise in ear accompanied by nausea and vomiting - lasted about 3-4 hours; has had 3 attacks since initial attack in May 2016   Patient is accompained by: Family member   Pertinent History h/o TIA in 2005; spinal stenosis of cervical region   Patient Stated Goals Improve balance   Currently in Pain? No/denies            [facility] PT Assessment -  02/24/15 0001    Assessment   Medical Diagnosis Meniere's disease   Onset Date/Surgical Date --  May 2016   Balance Screen   Has the patient fallen in the past 6 months No   Has the patient had a decrease in activity level because of a fear of falling?  No   Is the patient reluctant to leave their home because of a fear of falling?  No   Home Environment   Living Environment Private residence   Type of Home House   Home Access Stairs to enter   Entrance Stairs-Number of Steps 3   Entrance Stairs-Rails Can reach both   Prior Function   Level of Independence Independent            Vestibular Assessment - 02/24/15 0001    Vestibular Assessment   General Observation Pt. is a 69 year old female who presents to PT accompnaied by her husband - is tearful at times when discussing recent diagnosis of Meniere's disease and hearing loss and changes   Symptom Behavior   Type of Dizziness Unsteady with head/body turns  pt reports feeling off balance   Frequency of Dizziness most recent attack of vertigo was approx. 4 weeks ago   Merchandiser, retail  10   Dynamic line 6                       PT Education - 02/24/15 0919    Education provided Yes   Education Details Meniere's disease handout and hydrops and diet management for Meniere's   Person(s) Educated Patient;Spouse   Methods Explanation;Demonstration;Handout   Comprehension Verbalized understanding;Returned demonstration          PT Short Term Goals - 02/24/15 1054    PT SHORT TERM GOAL #1   Title Pt. will demonstrate improved VOR function so that DVA is </= 3 line difference.  (04-24-15)   Time 4   Period Weeks   Status New   PT SHORT TERM GOAL #2   Title Pt. will subjectively report at least 25% improvement in balance.  (04-24-15)   Time 4   Period Weeks   Status New   PT SHORT TERM GOAL #3   Title Perform SOT and establish goal as appropriate. (04-24-15)   Time 4   Period Weeks   Status  New   PT SHORT TERM GOAL #4   Title Independent in HEP for balance and vestibular exercises.  (04-24-15)   Time 4   Period Weeks   Status New           PT Long Term Goals - 02/24/15 1057    PT LONG TERM GOAL #1   Title Improve FOTO score to >/= 85/100 for improved pt satisfaction.  (04-24-15)   Time 8   Period Weeks   Status New   PT LONG TERM GOAL #2   Title Improve DHI to </= 40100 to demo improvement in vertigo symptoms.  (04-24-15)   Time 8   Period Weeks   Status New   PT LONG TERM GOAL #3   Title Improve DVA to </= 2 line difference to demo improved VOR function.  (04-24-15)   Time 8   Period Weeks   Status New   PT LONG TERM GOAL #4   Title Improve composite score of Sensory Organization Test by at least 15 points for improved balance.  (04-24-15)   Time 8   Period Weeks   Status New               Plan - 02/24/15 0926    Clinical Impression Statement Pt. is a 50 yr lady who was recently diagnosed with Meniere's disease (May 2016) and presents with c/o hearing loss and changes and with balance deficits; pt is very anxious and upset regarding changes in functional status and hearing changes   Pt will benefit from skilled therapeutic intervention in order to improve on the following deficits Decreased balance;Decreased mobility;Dizziness;Difficulty walking   Rehab Potential Good   PT Frequency 2x / week   PT Duration 8 weeks   PT Treatment/Interventions ADLs/Self Care Home Management;Gait training;Stair training;Functional mobility training;Patient/family education;Neuromuscular re-education;Balance training;Therapeutic exercise;Therapeutic activities;Vestibular   PT Next Visit Plan do SOT & give gaze stabilization exercise for HEP   PT Home Exercise Plan balance on foam and VOR x1   Consulted and Agree with Plan of Care Patient          G-Codes - 03-03-2015 1231    Functional Assessment Tool Used clinical judgment - c/o vertigo - DHI 70%   Functional  Limitation Other PT primary   Other PT Primary Current Status (W9604) At least 60 percent but less than 80 percent impaired, limited or restricted   Other  PT Primary Goal Status [phone]) At least 40 percent but less than 60 percent impaired, limited or restricted       Problem List Patient Active Problem List   Diagnosis Date Noted  . Dizziness and giddiness 04/04/2013  . Abnormality of gait 02/15/2013  . Spinal stenosis of cervical region 02/15/2013  . Palpitations 11/28/2012  . History of supraventricular tachycardia 11/28/2012  . Essential hypertension 11/28/2012  . History of TIA (transient ischemic attack) 2005 11/28/2012    Mikya Don, Donavan Burnet, PT 02/24/2015, 12:38 PM  [facility] [address] [place], [place], [address] Phone: [phone]   Fax:  [phone]

## 2015-02-26 ENCOUNTER — Ambulatory Visit: Payer: Medicare Other | Admitting: Physical Therapy

## 2015-03-03 ENCOUNTER — Encounter: Payer: Self-pay | Admitting: Physical Therapy

## 2015-03-03 ENCOUNTER — Ambulatory Visit: Payer: Medicare Other | Attending: Diagnostic Neuroimaging | Admitting: Physical Therapy

## 2015-03-03 DIAGNOSIS — R2681 Unsteadiness on feet: Secondary | ICD-10-CM | POA: Diagnosis present

## 2015-03-03 DIAGNOSIS — R42 Dizziness and giddiness: Secondary | ICD-10-CM | POA: Insufficient documentation

## 2015-03-03 DIAGNOSIS — R269 Unspecified abnormalities of gait and mobility: Secondary | ICD-10-CM | POA: Diagnosis present

## 2015-03-03 NOTE — Therapy (Signed)
[facility] [address] [place], [place], [address] Phone: [phone]   Fax:  [phone]  Physical Therapy Treatment  Patient Details  Name: [name] MRN: [id number] Date of Birth: 1946-05-09 Referring Provider:  Renford Dills, MD  Encounter Date: 03/03/2015      PT End of Session - 03/03/15 2140    Visit Number 2   Number of Visits 17   Date for PT Re-Evaluation 03/25/15   Authorization Type Medicare   Authorization Time Period 02-23-15 - 04-24-15   PT Start Time 0846   PT Stop Time 0932   PT Time Calculation (min) 46 min      Past Medical History  Diagnosis Date  . PVC (premature ventricular contraction)     intermittent  . Syncope and collapse     last friday had alot of dizziness  . Paroxysmal SVT (supraventricular tachycardia)   . High blood pressure   . Dyslipidemia   . Multiple thyroid nodules   . Double vision 10/2014    bilateral  . Depression   . Anxiety     Past Surgical History  Procedure Laterality Date  . Abdominal hysterectomy    . Tonsillectomy    . Catheter ablation      There were no vitals filed for this visit.  Visit Diagnosis:  Dizziness and giddiness  Abnormality of gait      Subjective Assessment - 03/03/15 2132    Subjective Pt. states she had an appt last Thursday at [facility] with Dr. Joelene Millin - he is going to do another test to confirm diagnosis of Meniere's   Patient is accompained by: Family member   Pertinent History h/o TIA in 2005; spinal stenosis of cervical region   Patient Stated Goals Improve balance   Currently in Pain? No/denies                Vestibular Assessment - 03/03/15 0001    Balancemaster   Community education officer Comment Composite score 63/100 with N= 65/100; Normal somatosensory , visual and vestibular inputs     Sensory Organization Test results =  Condition 1 = all 3 normal Condition 2 =  all 3 normal " 3 = 1st slightly decr., other 2 WNL's " 4 = 1 and 3 decr. Slightly with 2 N " 5 = 1 &2 N with 3 slightly decr. " 6 = fall on 1, 2 &3 N   Dynamic visual acuity test - 4 line difference (line 5 read) with line 9 static visual acuity test  Pt instructed in gaze stabilization exercise - horizontal and vertical head turns - 60 secs - standing on floor  Pt instructed in balance on foam with feet apart EO and EC and feet together EO and EC with targets with EO and With head turns with EC       [facility] Adult PT Treatment/Exercise - 03/03/15 0001    Standardized Balance Assessment   Standardized Balance Assessment Balance Master Testing                PT Education - 03/03/15 2139    Education provided Yes   Education Details gaze stabilization and balance on foam with EO and EC   Person(s) Educated Patient;Spouse   Methods Explanation;Demonstration;Handout   Comprehension Verbalized understanding;Returned demonstration          PT Short Term Goals - 03/03/15 2153    PT SHORT TERM GOAL #1   Title Pt.  will demonstrate improved VOR function so that DVA is </= 3 line difference.  (03-25-15)   PT SHORT TERM GOAL #2   Title Pt. will subjectively report at least 25% improvement in balance.  (03-25-15)   PT SHORT TERM GOAL #3   Title Perform SOT and establish goal as appropriate. (03-25-15)   PT SHORT TERM GOAL #4   Title Independent in HEP for balance and vestibular exercises.  (03-25-15)           PT Long Term Goals - 02/24/15 1057    PT LONG TERM GOAL #1   Title Improve FOTO score to >/= 85/100 for improved pt satisfaction.  (04-24-15)   Time 8   Period Weeks   Status New   PT LONG TERM GOAL #2   Title Improve DHI to </= 40100 to demo improvement in vertigo symptoms.  (04-24-15)   Time 8   Period Weeks   Status New   PT LONG TERM GOAL #3   Title Improve DVA to </= 2 line difference to demo improved VOR function.  (04-24-15)   Time 8   Period Weeks    Status New   PT LONG TERM GOAL #4   Title Improve composite score of Sensory Organization Test by at least 15 points for improved balance.  (04-24-15)   Time 8   Period Weeks   Status New               Plan - 03/03/15 2141    Clinical Impression Statement Pt.'s score on SOT is WNL's today - pt reports status fluctuates and that today is a good day with no vertigo reported during PT session today; pt's main deficit is with conflicting visual information   Pt will benefit from skilled therapeutic intervention in order to improve on the following deficits Decreased balance;Decreased mobility;Dizziness;Difficulty walking   Rehab Potential Good   PT Frequency 2x / week   PT Duration 8 weeks   PT Treatment/Interventions ADLs/Self Care Home Management;Gait training;Stair training;Functional mobility training;Patient/family education;Neuromuscular re-education;Balance training;Therapeutic exercise;Therapeutic activities;Vestibular   PT Next Visit Plan check HEP; cont vestibular exercises   PT Home Exercise Plan balance on foam and VOR x1   Consulted and Agree with Plan of Care Patient        Problem List Patient Active Problem List   Diagnosis Date Noted  . Dizziness and giddiness 04/04/2013  . Abnormality of gait 02/15/2013  . Spinal stenosis of cervical region 02/15/2013  . Palpitations 11/28/2012  . History of supraventricular tachycardia 11/28/2012  . Essential hypertension 11/28/2012  . History of TIA (transient ischemic attack) 2005 11/28/2012    Iolani Twilley, Donavan Burnet, PT 03/03/2015, 9:53 PM  [facility] [address] [place], [place], [address] Phone: [phone]   Fax:  [phone]

## 2015-03-03 NOTE — Patient Instructions (Signed)
Gaze Stabilization: Standing Feet Apart   Feet shoulder width apart, keeping eyes on target on wall ___6_ feet away, tilt head down 15-30 and move head side to side for _60___ seconds. Repeat while moving head up and down for _60___ seconds. Do _3-5Feet Together (Compliant Surface) Varied Arm Positions - Eyes Open   With eyes open, standing on compliant surface: ___pillow_____, feet together and arms out, look at a stationary object. Hold _30___ seconds. Repeat _2___ times per session. Do ___2_ sessions per day.  Copyright  VHI. All rights reserved.  Balance: Eyes Closed - Bilateral (Varied Surfaces)   Stand, feet shoulder width, close eyes. Maintain balance _30___ seconds. Repeat _2___ times per set. Do ___2_ sets per session. Do __2__ sessions per week. Repeat on compliant surface: foam.  Copyright  VHI. All rights reserved.  ___ sessions per day. Repeat using target on pattern background.  Copyright  VHI. All rights reserved.  Gaze Stabilization: Tip Card 1.Target must remain in focus, not blurry, and appear stationary while head is in motion. 2.Perform exercises with small head movements (45 to either side of midline). 3.Increase speed of head motion so long as target is in focus. 4.If you wear eyeglasses, be sure you can see target through lens (therapist will give specific instructions for bifocal / progressive lenses). 5.These exercises may provoke dizziness or nausea. Work through these symptoms. If too dizzy, slow head movement slightly. Rest between each exercise. 6.Exercises demand concentration; avoid distractions. 7.For safety, perform standing exercises close to a counter, wall, corner, or next to someone.  Copyright  VHI. All rights reserved.

## 2015-03-05 ENCOUNTER — Ambulatory Visit: Payer: Medicare Other | Admitting: Physical Therapy

## 2015-03-05 DIAGNOSIS — R42 Dizziness and giddiness: Secondary | ICD-10-CM | POA: Diagnosis not present

## 2015-03-05 DIAGNOSIS — R269 Unspecified abnormalities of gait and mobility: Secondary | ICD-10-CM

## 2015-03-08 ENCOUNTER — Encounter: Payer: Self-pay | Admitting: Physical Therapy

## 2015-03-08 NOTE — Therapy (Signed)
[facility] [address] [place], [place], [address] Phone: [phone]   Fax:  [phone]  Physical Therapy Treatment  Patient Details  Name: [name] MRN: [id number] Date of Birth: 1946/04/21 Referring Provider:  Renford Dills, MD  Encounter Date: 03/05/2015      PT End of Session - 03/08/15 1512    Visit Number 3  G3   Number of Visits 17   Date for PT Re-Evaluation 03/25/15   Authorization Type Medicare   Authorization Time Period 02-23-15 - 04-24-15   PT Start Time 0850   PT Stop Time 0930   PT Time Calculation (min) 40 min      Past Medical History  Diagnosis Date  . PVC (premature ventricular contraction)     intermittent  . Syncope and collapse     last friday had alot of dizziness  . Paroxysmal SVT (supraventricular tachycardia)   . High blood pressure   . Dyslipidemia   . Multiple thyroid nodules   . Double vision 10/2014    bilateral  . Depression   . Anxiety     Past Surgical History  Procedure Laterality Date  . Abdominal hysterectomy    . Tonsillectomy    . Catheter ablation      There were no vitals filed for this visit.  Visit Diagnosis:  Dizziness and giddiness  Abnormality of gait      Subjective Assessment - 03/08/15 1510    Currently in Pain? No/denies                              Balance Exercises - 03/08/15 1511    Balance Exercises: Standing   Standing Eyes Opened Narrow base of support (BOS);Wide (BOA);Foam/compliant surface;Solid surface;2 reps;10 secs   Standing Eyes Closed Wide (BOA);Head turns;Foam/compliant surface  on incline and on decline on ramp   SLS Eyes open;2 reps;Intermittent upper extremity support   Rockerboard Anterior/posterior;30 seconds;Intermittent UE support   Tandem Gait Forward;1 rep  25'   Other Standing Exercises sit to stand from mat with pivot turn to each side     TherEx; instructed in cervical AROM -  extension, R and L lateral flexion, and R and L rotation; (no flexion due to disc Herniation); instructed in Mulligan's stretch with use of towel for overpressure for both sides - 20 sec hold Cervical strengthening - prone x 10 reps with 3 sec hold        PT Short Term Goals - 03/03/15 2153    PT SHORT TERM GOAL #1   Title Pt. will demonstrate improved VOR function so that DVA is </= 3 line difference.  (03-25-15)   PT SHORT TERM GOAL #2   Title Pt. will subjectively report at least 25% improvement in balance.  (03-25-15)   PT SHORT TERM GOAL #3   Title Perform SOT and establish goal as appropriate. (03-25-15)   PT SHORT TERM GOAL #4   Title Independent in HEP for balance and vestibular exercises.  (03-25-15)           PT Long Term Goals - 02/24/15 1057    PT LONG TERM GOAL #1   Title Improve FOTO score to >/= 85/100 for improved pt satisfaction.  (04-24-15)   Time 8   Period Weeks   Status New   PT LONG TERM GOAL #2   Title Improve DHI to </= 40100 to demo improvement in vertigo symptoms.  (  04-24-15)   Time 8   Period Weeks   Status New   PT LONG TERM GOAL #3   Title Improve DVA to </= 2 line difference to demo improved VOR function.  (04-24-15)   Time 8   Period Weeks   Status New   PT LONG TERM GOAL #4   Title Improve composite score of Sensory Organization Test by at least 15 points for improved balance.  (04-24-15)   Time 8   Period Weeks   Status New               Plan - 03/08/15 1513    Clinical Impression Statement Minimal to no dizziness provoked today with vestibular/balance activities; pt feels vertigo is coming from her neck (problems with cervical discs)   Pt will benefit from skilled therapeutic intervention in order to improve on the following deficits Decreased balance;Decreased mobility;Dizziness;Difficulty walking   Rehab Potential Good   PT Frequency 2x / week   PT Duration 8 weeks   PT Treatment/Interventions ADLs/Self Care Home  Management;Gait training;Stair training;Functional mobility training;Patient/family education;Neuromuscular re-education;Balance training;Therapeutic exercise;Therapeutic activities;Vestibular   PT Next Visit Plan check HEP; cont vestibular exercises   PT Home Exercise Plan balance on foam and VOR x1   Consulted and Agree with Plan of Care Patient        Problem List Patient Active Problem List   Diagnosis Date Noted  . Dizziness and giddiness 04/04/2013  . Abnormality of gait 02/15/2013  . Spinal stenosis of cervical region 02/15/2013  . Palpitations 11/28/2012  . History of supraventricular tachycardia 11/28/2012  . Essential hypertension 11/28/2012  . History of TIA (transient ischemic attack) 2005 11/28/2012    Dwanda Tufano, Donavan Burnet, PT 03/08/2015, 3:15 PM  [facility] [address] [place], [place], [address] Phone: [phone]   Fax:  [phone]

## 2015-03-17 ENCOUNTER — Ambulatory Visit: Payer: Medicare Other | Admitting: Physical Therapy

## 2015-03-17 DIAGNOSIS — R2681 Unsteadiness on feet: Secondary | ICD-10-CM

## 2015-03-17 DIAGNOSIS — R42 Dizziness and giddiness: Secondary | ICD-10-CM

## 2015-03-17 DIAGNOSIS — R269 Unspecified abnormalities of gait and mobility: Secondary | ICD-10-CM

## 2015-03-18 ENCOUNTER — Encounter: Payer: Self-pay | Admitting: Physical Therapy

## 2015-03-18 NOTE — Therapy (Signed)
[facility] [address] [place], [place], [address] Phone: [phone]   Fax:  [phone]  Physical Therapy Treatment  Patient Details  Name: [name] MRN: [id number] Date of Birth: October 05, 1945 Referring Provider:  Seward Carol, MD  Encounter Date: 03/17/2015      PT End of Session - 03/18/15 1539    Visit Number 4  G4   Number of Visits 17   Date for PT Re-Evaluation 03/25/15   Authorization Type Medicare   Authorization Time Period 02-23-15 - 04-24-15   PT Start Time 1102   PT Stop Time 1150   PT Time Calculation (min) 48 min      Past Medical History  Diagnosis Date  . PVC (premature ventricular contraction)     intermittent  . Syncope and collapse     last friday had alot of dizziness  . Paroxysmal SVT (supraventricular tachycardia)   . High blood pressure   . Dyslipidemia   . Multiple thyroid nodules   . Double vision 10/2014    bilateral  . Depression   . Anxiety     Past Surgical History  Procedure Laterality Date  . Abdominal hysterectomy    . Tonsillectomy    . Catheter ablation      There were no vitals filed for this visit.  Visit Diagnosis:  Dizziness and giddiness  Abnormality of gait  Unsteadiness      Subjective Assessment - 03/18/15 1535    Subjective Pt. reports going on vacation to beach last week - did fine with the car ride with no episodes of vertigo and states she really had no LOB   Patient is accompained by: Family member   Pertinent History h/o TIA in 2005; spinal stenosis of cervical region   Patient Stated Goals Improve balance   Currently in Pain? No/denies                              Balance Exercises - 03/18/15 1536    Balance Exercises: Standing   Standing Eyes Opened Wide (BOA);Foam/compliant surface;Solid surface   Standing Eyes Closed Solid surface;Foam/compliant surface;Narrow base of support (BOS);Wide (BOA);Head  turns;10 secs   Rockerboard Anterior/posterior;EO;20 seconds   Other Standing Exercises partial squats on BOSU for core stabilization and balance     Dynamic visual acuity test  - 3 line difference Reviewed HEP - including Mulligan's stretch for cervical rotation with use of towel Pt performed balance on foam exercises - with head turns side to side and up/down x 5 reps each - standing with feet  Apart and together Pt performed weight shifts on Bosu and then static standing on bosu with head turns Progressed gaze stabilization exercise to standing on foam and with patterned background       PT Education - 03/18/15 1538    Education provided Yes   Education Details recommend buying BOSU for HEP and progressed gaze stabilization exercise to patterned background and standing on foam   Person(s) Educated Patient;Spouse   Methods Explanation;Demonstration   Comprehension Verbalized understanding;Returned demonstration          PT Short Term Goals - 03/18/15 1543    PT SHORT TERM GOAL #1   Title Pt. will demonstrate improved VOR function so that DVA is </= 3 line difference.  (03-25-15)   Baseline met 03-17-15   Status Achieved   PT SHORT TERM GOAL #3   Title Perform SOT and  establish goal as appropriate. (03-25-15)   Baseline composite score WNL's so no LTG needed   PT SHORT TERM GOAL #4   Title Independent in HEP for balance and vestibular exercises.  (03-25-15)           PT Long Term Goals - 02/24/15 1057    PT LONG TERM GOAL #1   Title Improve FOTO score to >/= 85/100 for improved pt satisfaction.  (04-24-15)   Time 8   Period Weeks   Status New   PT LONG TERM GOAL #2   Title Improve DHI to </= 40100 to demo improvement in vertigo symptoms.  (04-24-15)   Time 8   Period Weeks   Status New   PT LONG TERM GOAL #3   Title Improve DVA to </= 2 line difference to demo improved VOR function.  (04-24-15)   Time 8   Period Weeks   Status New   PT LONG TERM GOAL #4   Title  Improve composite score of Sensory Organization Test by at least 15 points for improved balance.  (04-24-15)   Time 8   Period Weeks   Status New               Plan - 03/18/15 1540    Clinical Impression Statement Pt's balance is good - has some difficulty with high level activities, i.e. compliant surface but did well with balance on foam exercises; progressed HEP for VOR and recommended buying bosu for HEP   Pt will benefit from skilled therapeutic intervention in order to improve on the following deficits Decreased balance;Decreased mobility;Dizziness;Difficulty walking   Rehab Potential Good   PT Frequency 2x / week   PT Duration 8 weeks   PT Treatment/Interventions ADLs/Self Care Home Management;Gait training;Stair training;Functional mobility training;Patient/family education;Neuromuscular re-education;Balance training;Therapeutic exercise;Therapeutic activities;Vestibular   PT Next Visit Plan check LTG's - may D/C if pt continues to not experience vertigo - pt to return in 2 weeks due to progressing well   PT Home Exercise Plan balance on foam and VOR x1 on foam and with patterned background   Consulted and Agree with Plan of Care Patient;Family member/caregiver   Family Member Consulted husband        Problem List Patient Active Problem List   Diagnosis Date Noted  . Dizziness and giddiness 04/04/2013  . Abnormality of gait 02/15/2013  . Spinal stenosis of cervical region 02/15/2013  . Palpitations 11/28/2012  . History of supraventricular tachycardia 11/28/2012  . Essential hypertension 11/28/2012  . History of TIA (transient ischemic attack) 2005 11/28/2012    Dilday, Linda Suzanne, PT 03/18/2015, 3:57 PM  [facility] [address] [place], [place], [address] Phone: [phone]   Fax:  [phone]      

## 2015-03-23 ENCOUNTER — Encounter: Payer: Medicare Other | Admitting: Physical Therapy

## 2015-03-24 ENCOUNTER — Encounter: Payer: Medicare Other | Admitting: Physical Therapy

## 2015-03-26 ENCOUNTER — Encounter: Payer: Medicare Other | Admitting: Physical Therapy

## 2015-03-30 ENCOUNTER — Ambulatory Visit: Payer: Medicare Other | Admitting: Physical Therapy

## 2015-03-31 ENCOUNTER — Encounter: Payer: Medicare Other | Admitting: Physical Therapy

## 2015-04-02 ENCOUNTER — Encounter: Payer: Medicare Other | Admitting: Physical Therapy

## 2015-04-21 ENCOUNTER — Ambulatory Visit: Payer: Medicare Other | Admitting: Diagnostic Neuroimaging

## 2015-06-11 ENCOUNTER — Encounter (HOSPITAL_COMMUNITY): Payer: Self-pay | Admitting: Emergency Medicine

## 2015-06-11 ENCOUNTER — Emergency Department (HOSPITAL_COMMUNITY): Payer: Medicare Other

## 2015-06-11 ENCOUNTER — Observation Stay (HOSPITAL_COMMUNITY)
Admission: EM | Admit: 2015-06-11 | Discharge: 2015-06-13 | Disposition: A | Discharge status: Home / Self Care | Payer: Medicare Other | Attending: Internal Medicine | Admitting: Internal Medicine

## 2015-06-11 DIAGNOSIS — I10 Essential (primary) hypertension: Secondary | ICD-10-CM | POA: Diagnosis present

## 2015-06-11 DIAGNOSIS — G43009 Migraine without aura, not intractable, without status migrainosus: Secondary | ICD-10-CM | POA: Insufficient documentation

## 2015-06-11 DIAGNOSIS — Z87891 Personal history of nicotine dependence: Secondary | ICD-10-CM | POA: Insufficient documentation

## 2015-06-11 DIAGNOSIS — G43809 Other migraine, not intractable, without status migrainosus: Secondary | ICD-10-CM | POA: Insufficient documentation

## 2015-06-11 DIAGNOSIS — I471 Supraventricular tachycardia: Secondary | ICD-10-CM | POA: Insufficient documentation

## 2015-06-11 DIAGNOSIS — G453 Amaurosis fugax: Secondary | ICD-10-CM | POA: Diagnosis present

## 2015-06-11 DIAGNOSIS — G459 Transient cerebral ischemic attack, unspecified: Principal | ICD-10-CM | POA: Diagnosis present

## 2015-06-11 DIAGNOSIS — F329 Major depressive disorder, single episode, unspecified: Secondary | ICD-10-CM | POA: Insufficient documentation

## 2015-06-11 DIAGNOSIS — E785 Hyperlipidemia, unspecified: Secondary | ICD-10-CM | POA: Diagnosis not present

## 2015-06-11 DIAGNOSIS — H532 Diplopia: Secondary | ICD-10-CM | POA: Insufficient documentation

## 2015-06-11 DIAGNOSIS — Z79899 Other long term (current) drug therapy: Secondary | ICD-10-CM | POA: Insufficient documentation

## 2015-06-11 DIAGNOSIS — F419 Anxiety disorder, unspecified: Secondary | ICD-10-CM | POA: Diagnosis not present

## 2015-06-11 DIAGNOSIS — H53122 Transient visual loss, left eye: Secondary | ICD-10-CM

## 2015-06-11 DIAGNOSIS — Z8249 Family history of ischemic heart disease and other diseases of the circulatory system: Secondary | ICD-10-CM | POA: Insufficient documentation

## 2015-06-11 DIAGNOSIS — H81399 Other peripheral vertigo, unspecified ear: Secondary | ICD-10-CM | POA: Diagnosis not present

## 2015-06-11 LAB — PROTIME-INR
INR: 0.9 (ref 0.00–1.49)
Prothrombin Time: 12.3 seconds (ref 11.6–15.2)

## 2015-06-11 LAB — DIFFERENTIAL
Basophils Absolute: 0 10*3/uL (ref 0.0–0.1)
Basophils Relative: 1 %
Eosinophils Absolute: 0.1 10*3/uL (ref 0.0–0.7)
Eosinophils Relative: 2 %
Lymphocytes Relative: 23 %
Lymphs Abs: 1.2 10*3/uL (ref 0.7–4.0)
Monocytes Absolute: 0.6 10*3/uL (ref 0.1–1.0)
Monocytes Relative: 11 %
Neutro Abs: 3.4 10*3/uL (ref 1.7–7.7)
Neutrophils Relative %: 65 %

## 2015-06-11 LAB — COMPREHENSIVE METABOLIC PANEL
ALT: 19 U/L (ref 14–54)
AST: 23 U/L (ref 15–41)
Albumin: 3.8 g/dL (ref 3.5–5.0)
Alkaline Phosphatase: 59 U/L (ref 38–126)
Anion gap: 8 (ref 5–15)
BUN: 21 mg/dL — ABNORMAL HIGH (ref 6–20)
CO2: 26 mmol/L (ref 22–32)
Calcium: 9.4 mg/dL (ref 8.9–10.3)
Chloride: 107 mmol/L (ref 101–111)
Creatinine, Ser: 0.92 mg/dL (ref 0.44–1.00)
GFR calc Af Amer: >60 mL/min (ref >60)
GFR calc non Af Amer: >60 mL/min (ref >60)
Glucose, Bld: 123 mg/dL — ABNORMAL HIGH (ref 65–99)
Potassium: 3.8 mmol/L (ref 3.5–5.1)
Sodium: 141 mmol/L (ref 135–145)
Total Bilirubin: 0.4 mg/dL (ref 0.3–1.2)
Total Protein: 6.1 g/dL — ABNORMAL LOW (ref 6.5–8.1)

## 2015-06-11 LAB — APTT: aPTT: 30 seconds (ref 24–37)

## 2015-06-11 LAB — CBC
HCT: 38.7 % (ref 36.0–46.0)
Hemoglobin: 13 g/dL (ref 12.0–15.0)
MCH: 30.7 pg (ref 26.0–34.0)
MCHC: 33.6 g/dL (ref 30.0–36.0)
MCV: 91.3 fL (ref 78.0–100.0)
Platelets: 249 10*3/uL (ref 150–400)
RBC: 4.24 MIL/uL (ref 3.87–5.11)
RDW: 12.5 % (ref 11.5–15.5)
WBC: 5.3 10*3/uL (ref 4.0–10.5)

## 2015-06-11 LAB — I-STAT TROPONIN, ED: Troponin i, poc: 0 ng/mL (ref 0.00–0.08)

## 2015-06-11 NOTE — Consult Note (Signed)
Admission H&P    Chief Complaint: Transient left visual field defect.  HPI: [name] is an 69 y.o. female with a history of hypertension, hyperlipidemia, vertigo and diplopia, as well as syncope, presenting alone an episode of transient left visual changes which she describes as blurred vision with bright zigzag lines, and inability to see the left side of the images and report writing. Patient has not had symptoms of this sort in the past and has no history of migraine headaches. She has not been on antiplatelet medication. CT scan of her head showed no acute intracranial abnormality. Visual changes resolved without intervention. NIH stroke score was 0.  LSN: 4:30 PM on 06/11/2015 tPA Given: No: Deficits resolved mRankin:  Past Medical History  Diagnosis Date  . PVC (premature ventricular contraction)     intermittent  . Syncope and collapse     last friday had alot of dizziness  . Paroxysmal SVT (supraventricular tachycardia) ([facility])   . High blood pressure   . Dyslipidemia   . Multiple thyroid nodules   . Double vision 10/2014    bilateral  . Depression   . Anxiety     Past Surgical History  Procedure Laterality Date  . Abdominal hysterectomy    . Tonsillectomy    . Catheter ablation      Family History  Problem Relation Age of Onset  . High blood pressure Father   . Stroke Father 69  . CAD Father 74    CABG.  Lived to be 85  . Arthritis Mother   . Neuropathy Mother    Social History:  reports that she quit smoking about 30 years ago. Her smoking use included Cigarettes. She smoked 0.00 packs per day for 20 years. She has never used smokeless tobacco. She reports that she does not drink alcohol or use illicit drugs.  Allergies:  Allergies  Allergen Reactions  . Epinephrine   . Contrast Media [Iodinated Diagnostic Agents] Itching    Pt had itchy nose and dry throat and was told she was allergic to IV contrast and does not want to have it.  Marland Kitchen Keflex [Cephalexin]  Swelling    Medications: Patient's preadmission medications were reviewed by me.  ROS: History obtained from spouse and the patient  General ROS: negative for - chills, fatigue, fever, night sweats, weight gain or weight loss Psychological ROS: negative for - behavioral disorder, hallucinations, memory difficulties, mood swings or suicidal ideation Ophthalmic ROS: negative for - blurry vision, double vision, eye pain or loss of vision ENT ROS: negative for - epistaxis, nasal discharge, oral lesions, sore throat, tinnitus or vertigo Allergy and Immunology ROS: negative for - hives or itchy/watery eyes Hematological and Lymphatic ROS: negative for - bleeding problems, bruising or swollen lymph nodes Endocrine ROS: negative for - galactorrhea, hair pattern changes, polydipsia/polyuria or temperature intolerance Respiratory ROS: negative for - cough, hemoptysis, shortness of breath or wheezing Cardiovascular ROS: negative for - chest pain, dyspnea on exertion, edema or irregular heartbeat Gastrointestinal ROS: negative for - abdominal pain, diarrhea, hematemesis, nausea/vomiting or stool incontinence Genito-Urinary ROS: negative for - dysuria, hematuria, incontinence or urinary frequency/urgency Musculoskeletal ROS: negative for - joint swelling or muscular weakness Neurological ROS: as noted in HPI Dermatological ROS: negative for rash and skin lesion changes  Physical Examination: Blood pressure 172/78, pulse 74, temperature 97.5 F (36.4 C), temperature source Oral, resp. rate 18, SpO2 100 %.  HEENT-  Normocephalic, no lesions, without obvious abnormality.  Normal external eye and conjunctiva.  Normal TM's bilaterally.  Normal auditory canals and external ears. Normal external nose, mucus membranes and septum.  Normal pharynx. Neck supple with no masses, nodes, nodules or enlargement. Cardiovascular - regular rate and rhythm, S1, S2 normal, no murmur, click, rub or gallop Lungs - chest  clear, no wheezing, rales, normal symmetric air entry Abdomen - soft, non-tender; bowel sounds normal; no masses,  no organomegaly Extremities - no joint deformities, effusion, or inflammation and no edema  Neurologic Examination: Mental Status: Alert, oriented, thought content appropriate.  Speech fluent without evidence of aphasia. Able to follow commands without difficulty. Cranial Nerves: II-Visual fields were normal. III/IV/VI-Pupils were equal and reacted normally to light. Extraocular movements were full and conjugate.    V/VII-no facial numbness and no facial weakness. VIII-normal. X-normal speech and symmetrical palatal movement. XI: trapezius strength/neck flexion strength normal bilaterally XII-midline tongue extension with normal strength. Motor: 5/5 bilaterally with normal tone and bulk Sensory: Normal throughout. Deep Tendon Reflexes: Trace to 1+ and symmetric in upper extremities and absent in lower extremities. Plantars: Mute bilaterally Cerebellar: Normal finger-to-nose testing. Carotid auscultation: Normal  Results for orders placed or performed during the hospital encounter of 06/11/15 (from the past 48 hour(s))  I-stat troponin, ED (not at [facility], [facility])     Status: None   Collection Time: 06/11/15  6:10 PM  Result Value Ref Range   Troponin i, poc 0.00 0.00 - 0.08 ng/mL   Comment 3            Comment: Due to the release kinetics of cTnI, a negative result within the first hours of the onset of symptoms does not rule out myocardial infarction with certainty. If myocardial infarction is still suspected, repeat the test at appropriate intervals.   Protime-INR     Status: None   Collection Time: 06/11/15  6:14 PM  Result Value Ref Range   Prothrombin Time 12.3 11.6 - 15.2 seconds   INR 0.90 0.00 - 1.49  APTT     Status: None   Collection Time: 06/11/15  6:14 PM  Result Value Ref Range   aPTT 30 24 - 37 seconds  CBC     Status: None   Collection Time: 06/11/15   6:14 PM  Result Value Ref Range   WBC 5.3 4.0 - 10.5 K/uL   RBC 4.24 3.87 - 5.11 MIL/uL   Hemoglobin 13.0 12.0 - 15.0 g/dL   HCT 38.7 36.0 - 46.0 %   MCV 91.3 78.0 - 100.0 fL   MCH 30.7 26.0 - 34.0 pg   MCHC 33.6 30.0 - 36.0 g/dL   RDW 12.5 11.5 - 15.5 %   Platelets 249 150 - 400 K/uL  Differential     Status: None   Collection Time: 06/11/15  6:14 PM  Result Value Ref Range   Neutrophils Relative % 65 %   Neutro Abs 3.4 1.7 - 7.7 K/uL   Lymphocytes Relative 23 %   Lymphs Abs 1.2 0.7 - 4.0 K/uL   Monocytes Relative 11 %   Monocytes Absolute 0.6 0.1 - 1.0 K/uL   Eosinophils Relative 2 %   Eosinophils Absolute 0.1 0.0 - 0.7 K/uL   Basophils Relative 1 %   Basophils Absolute 0.0 0.0 - 0.1 K/uL  Comprehensive metabolic panel     Status: Abnormal   Collection Time: 06/11/15  6:14 PM  Result Value Ref Range   Sodium 141 135 - 145 mmol/L   Potassium 3.8 3.5 - 5.1 mmol/L   Chloride 107  101 - 111 mmol/L   CO2 26 22 - 32 mmol/L   Glucose, Bld 123 (H) 65 - 99 mg/dL   BUN 21 (H) 6 - 20 mg/dL   Creatinine, Ser 0.92 0.44 - 1.00 mg/dL   Calcium 9.4 8.9 - 10.3 mg/dL   Total Protein 6.1 (L) 6.5 - 8.1 g/dL   Albumin 3.8 3.5 - 5.0 g/dL   AST 23 15 - 41 U/L   ALT 19 14 - 54 U/L   Alkaline Phosphatase 59 38 - 126 U/L   Total Bilirubin 0.4 0.3 - 1.2 mg/dL   GFR calc non Af Amer >60 >60 mL/min   GFR calc Af Amer >60 >60 mL/min    Comment: (NOTE) The eGFR has been calculated using the CKD EPI equation. This calculation has not been validated in all clinical situations. eGFR's persistently <60 mL/min signify possible Chronic Kidney Disease.    Anion gap 8 5 - 15   Ct Head Wo Contrast  06/11/2015  CLINICAL DATA:  Headache, vision changes EXAM: CT HEAD WITHOUT CONTRAST TECHNIQUE: Contiguous axial images were obtained from the base of the skull through the vertex without intravenous contrast. COMPARISON:  11/18/2014 FINDINGS: No skull fracture is noted. Paranasal sinuses and mastoid air  cells are unremarkable. No intracranial hemorrhage, mass effect or midline shift. No acute cortical infarction. No mass lesion is noted on this unenhanced scan. Again noted mild periventricular and patchy subcortical chronic white matter disease. IMPRESSION: No acute intracranial abnormality.  No significant change. Electronically Signed   By: Lahoma Crocker M.D.   On: 06/11/2015 18:30    Assessment: 69 y.o. female with multiple risk factors for stroke presenting with probable new onset migraine equivalent with visual manifestations. However, TIA or possible right PCA territory stroke cannot be ruled out at this point.  Stroke Risk Factors - family history, hyperlipidemia and, hypertension  Plan: 1. HgbA1c, fasting lipid panel 2. MRI, MRA  of the brain without contrast 3. PT consult, OT consult, Speech consult 4. Echocardiogram 5. Carotid dopplers 6. Prophylactic therapy-Antiplatelet med: Aspirin  7. Risk factor modification 8. Telemetry monitoring  C.R. Nicole Kindred, MD [facility] [phone]  06/11/2015, 7:57 PM

## 2015-06-11 NOTE — Progress Notes (Signed)
Pt is a new admission on the floor. BP is running high, 194/75, 187/81. HR is 73, 70. MD notified. He advised to call him when the SBP is 210.

## 2015-06-11 NOTE — ED Notes (Signed)
Called receiving unit.  

## 2015-06-11 NOTE — ED Provider Notes (Signed)
CSN: [id number]     Arrival date & time 06/11/15  1710 History   First MD Initiated Contact with Patient 06/11/15 1740     Chief Complaint  Patient presents with  . Visual Field Change    Patient is a 69 y.o. female presenting with eye problem. The history is provided by the patient and the spouse.  Eye Problem Location:  L eye Quality: no pain. Severity:  Moderate Onset quality:  Sudden Duration:  15 minutes Timing:  Constant Progression:  Resolved Chronicity:  New Relieved by:  None tried Worsened by:  Nothing tried Associated symptoms: no double vision, no headaches, no nausea, no numbness, no vomiting and no weakness   pt reports she was reading earlier today and had partial visual loss in left eye No eye pain No HA Lasted about 15 minutes She is now back to baseline No focal weakness reported She reports mild dizziness earlier in the day but none at this time Reports h/o TIA in past No h/o eye surgery She has never had this issue before  Past Medical History  Diagnosis Date  . PVC (premature ventricular contraction)     intermittent  . Syncope and collapse     last friday had alot of dizziness  . Paroxysmal SVT (supraventricular tachycardia) ([facility])   . High blood pressure   . Dyslipidemia   . Multiple thyroid nodules   . Double vision 10/2014    bilateral  . Depression   . Anxiety    Past Surgical History  Procedure Laterality Date  . Abdominal hysterectomy    . Tonsillectomy    . Catheter ablation     Family History  Problem Relation Age of Onset  . High blood pressure Father   . Stroke Father 94  . CAD Father 96    CABG.  Lived to be 85  . Arthritis Mother   . Neuropathy Mother    Social History  Substance Use Topics  . Smoking status: Former Smoker -- 0.00 packs/day for 20 years    Types: Cigarettes    Quit date: 12/11/1984  . Smokeless tobacco: Never Used     Comment: Quit 40 years ago  . Alcohol Use: No   OB History    No data available      Review of Systems  Constitutional: Negative for fever.  Eyes: Positive for visual disturbance. Negative for double vision and pain.  Cardiovascular: Negative for chest pain.  Gastrointestinal: Negative for nausea and vomiting.  Neurological: Positive for dizziness. Negative for weakness, numbness and headaches.  All other systems reviewed and are negative.     Allergies  Epinephrine; Contrast media; and Keflex  Home Medications   Prior to Admission medications   Medication Sig Start Date End Date Taking? Authorizing Provider  BIOTIN PO Take 1 tablet by mouth daily.   Yes Historical Provider, MD  Cholecalciferol (VITAMIN D3) 2000 UNITS TABS Take 2,000 Units by mouth daily.   Yes Historical Provider, MD  diazepam (VALIUM) 5 MG tablet Take 5 mg by mouth every 8 (eight) hours as needed (dizziness).   Yes Historical Provider, MD  meclizine (ANTIVERT) 12.5 MG tablet Take 1 tablet (12.5 mg total) by mouth 3 (three) times daily as needed for dizziness. 11/18/14  Yes April Palumbo, MD  ondansetron (ZOFRAN) 4 MG tablet TAKE 1 TABLET (4 MG TOTAL) BY MOUTH EVERY 8 (EIGHT) HOURS AS NEEDED FOR NAUSEA. 12/31/14  Yes Historical Provider, MD  diltiazem (CARDIZEM) 30 MG tablet Take 1  tablet (30 mg total) by mouth 4 (four) times daily. Patient not taking: Reported on 06/11/2015 09/26/14   Rollene RotundaJames Hochrein, MD   BP 193/83 mmHg  Pulse 85  Temp(Src) 98.1 F (36.7 C) (Oral)  Resp 18  SpO2 99% Physical Exam CONSTITUTIONAL: Well developed/well nourished HEAD: Normocephalic/atraumatic EYES: EOMI/PERRL, no nystagmus, no visual field deficit  no ptosis, no obvious abnormalities on fundoscopic exam ENMT: Mucous membranes moist NECK: supple no meningeal signs, no bruits CV: S1/S2 noted, no murmurs/rubs/gallops noted LUNGS: Lungs are clear to auscultation bilaterally, no apparent distress ABDOMEN: soft, nontender, no rebound or guarding GU:no cva tenderness NEURO:Awake/alert, face symmetric, no arm or  leg drift is noted Equal 5/5 strength with shoulder abduction, elbow flex/extension, wrist flex/extension in upper extremities and equal hand grips bilaterally Equal 5/5 strength with hip flexion,knee flex/extension, foot dorsi/plantar flexion Cranial nerves 3/4/5/6/01/02/09/11/12 tested and intact Sensation to light touch intact in all extremities EXTREMITIES: pulses normal, full ROM SKIN: warm, color normal PSYCH: no abnormalities of mood noted  ED Course  Procedures  7:12 PM Pt back to baseline Concern for possible TIA D/w dr Leroy Kennedycamilo with neuro to evaluate patient  tPA in stroke considered but not given due to: Symptoms resolved  8:20 PM Seen by neuro dr Roseanne Renostewart Concern for TIA Recommends admit D/w dr Toniann Failkakrakandy Will admit for TIA  Labs Review Labs Reviewed  COMPREHENSIVE METABOLIC PANEL - Abnormal; Notable for the following:    Glucose, Bld 123 (*)    BUN 21 (*)    Total Protein 6.1 (*)    All other components within normal limits  PROTIME-INR  APTT  CBC  DIFFERENTIAL  I-STAT TROPOININ, ED  CBG MONITORING, ED    Imaging Review Ct Head Wo Contrast  06/11/2015  CLINICAL DATA:  Headache, vision changes EXAM: CT HEAD WITHOUT CONTRAST TECHNIQUE: Contiguous axial images were obtained from the base of the skull through the vertex without intravenous contrast. COMPARISON:  11/18/2014 FINDINGS: No skull fracture is noted. Paranasal sinuses and mastoid air cells are unremarkable. No intracranial hemorrhage, mass effect or midline shift. No acute cortical infarction. No mass lesion is noted on this unenhanced scan. Again noted mild periventricular and patchy subcortical chronic white matter disease. IMPRESSION: No acute intracranial abnormality.  No significant change. Electronically Signed   By: Natasha MeadLiviu  Pop M.D.   On: 06/11/2015 18:30   I have personally reviewed and evaluated these lab results as part of my medical decision-making.   EKG Interpretation   Date/Time:  Thursday  June 11 2015 17:17:48 EST Ventricular Rate:  83 PR Interval:  142 QRS Duration: 70 QT Interval:  398 QTC Calculation: 467 R Axis:   64 Text Interpretation:  Normal sinus rhythm Nonspecific ST abnormality  Abnormal ECG artifact noted Confirmed by Bebe ShaggyWICKLINE  MD, Dorinda HillNALD ([id number]) on  06/11/2015 7:09:49 PM      MDM   Final diagnoses:  Amaurosis fugax    Nursing notes including past medical history and social history reviewed and considered in documentation Labs/vital reviewed myself and considered during evaluation     Zadie Rhineonald Shaquna Geigle, MD 06/11/15 2020

## 2015-06-11 NOTE — ED Notes (Signed)
Pt sts possible vision loss in left eye x 1 hour ago with some floaters that is now resolved; no sx noted at present; pt noted to be hypertensive

## 2015-06-12 ENCOUNTER — Observation Stay (HOSPITAL_COMMUNITY): Payer: Medicare Other

## 2015-06-12 ENCOUNTER — Observation Stay (HOSPITAL_BASED_OUTPATIENT_CLINIC_OR_DEPARTMENT_OTHER): Payer: Medicare Other

## 2015-06-12 ENCOUNTER — Encounter (HOSPITAL_COMMUNITY): Payer: Self-pay | Admitting: Internal Medicine

## 2015-06-12 DIAGNOSIS — G459 Transient cerebral ischemic attack, unspecified: Secondary | ICD-10-CM | POA: Diagnosis not present

## 2015-06-12 DIAGNOSIS — G43009 Migraine without aura, not intractable, without status migrainosus: Secondary | ICD-10-CM | POA: Insufficient documentation

## 2015-06-12 DIAGNOSIS — G43809 Other migraine, not intractable, without status migrainosus: Secondary | ICD-10-CM

## 2015-06-12 DIAGNOSIS — I10 Essential (primary) hypertension: Secondary | ICD-10-CM | POA: Diagnosis not present

## 2015-06-12 DIAGNOSIS — G453 Amaurosis fugax: Secondary | ICD-10-CM | POA: Insufficient documentation

## 2015-06-12 LAB — LIPID PANEL
Cholesterol: 257 mg/dL — ABNORMAL HIGH (ref 0–200)
HDL: 50 mg/dL (ref >40)
LDL Cholesterol: 179 mg/dL — ABNORMAL HIGH (ref 0–99)
Total CHOL/HDL Ratio: 5.1 RATIO
Triglycerides: 139 mg/dL (ref <150)
VLDL: 28 mg/dL (ref 0–40)

## 2015-06-12 LAB — COMPREHENSIVE METABOLIC PANEL
ALT: 19 U/L (ref 14–54)
AST: 20 U/L (ref 15–41)
Albumin: 3.7 g/dL (ref 3.5–5.0)
Alkaline Phosphatase: 61 U/L (ref 38–126)
Anion gap: 8 (ref 5–15)
BUN: 15 mg/dL (ref 6–20)
CO2: 28 mmol/L (ref 22–32)
Calcium: 9.6 mg/dL (ref 8.9–10.3)
Chloride: 105 mmol/L (ref 101–111)
Creatinine, Ser: 0.98 mg/dL (ref 0.44–1.00)
GFR calc Af Amer: >60 mL/min (ref >60)
GFR calc non Af Amer: 57 mL/min — ABNORMAL LOW (ref >60)
Glucose, Bld: 99 mg/dL (ref 65–99)
Potassium: 3.8 mmol/L (ref 3.5–5.1)
Sodium: 141 mmol/L (ref 135–145)
Total Bilirubin: 0.7 mg/dL (ref 0.3–1.2)
Total Protein: 6.4 g/dL — ABNORMAL LOW (ref 6.5–8.1)

## 2015-06-12 LAB — CBC
HCT: 39.3 % (ref 36.0–46.0)
Hemoglobin: 13.3 g/dL (ref 12.0–15.0)
MCH: 30.7 pg (ref 26.0–34.0)
MCHC: 33.8 g/dL (ref 30.0–36.0)
MCV: 90.8 fL (ref 78.0–100.0)
Platelets: 233 10*3/uL (ref 150–400)
RBC: 4.33 MIL/uL (ref 3.87–5.11)
RDW: 12.5 % (ref 11.5–15.5)
WBC: 6.5 10*3/uL (ref 4.0–10.5)

## 2015-06-12 LAB — SEDIMENTATION RATE: Sed Rate: 10 mm/hr (ref 0–22)

## 2015-06-12 MED ORDER — DIAZEPAM 5 MG PO TABS: 5.0000 mg | ORAL_TABLET | Freq: Three times a day (TID) | ORAL | Status: DC | PRN

## 2015-06-12 MED ORDER — SODIUM CHLORIDE 0.9 % IV SOLN
INTRAVENOUS | Status: DC
  Administered 2015-06-12: 02:00:00 via INTRAVENOUS

## 2015-06-12 MED ORDER — MECLIZINE HCL 12.5 MG PO TABS
12.5000 mg | ORAL_TABLET | Freq: Three times a day (TID) | ORAL | Status: DC | PRN
  Filled 2015-06-12: qty 1

## 2015-06-12 MED ORDER — ASPIRIN 300 MG RE SUPP
300.0000 mg | Freq: Every day | RECTAL | Status: DC
  Filled 2015-06-12: qty 1

## 2015-06-12 MED ORDER — ENOXAPARIN SODIUM 40 MG/0.4ML ~~LOC~~ SOLN
40.0000 mg | SUBCUTANEOUS | Status: DC
  Filled 2015-06-12: qty 0.4

## 2015-06-12 MED ORDER — HYDRALAZINE HCL 20 MG/ML IJ SOLN: 10.0000 mg | INTRAMUSCULAR | Status: DC | PRN

## 2015-06-12 MED ORDER — ASPIRIN 325 MG PO TABS
325.0000 mg | ORAL_TABLET | Freq: Every day | ORAL | Status: DC
  Administered 2015-06-12 – 2015-06-13 (×2): 325 mg via ORAL
  Filled 2015-06-12 (×2): qty 1

## 2015-06-12 MED ORDER — ATORVASTATIN CALCIUM 40 MG PO TABS
40.0000 mg | ORAL_TABLET | Freq: Every day | ORAL | Status: DC
  Filled 2015-06-12: qty 1

## 2015-06-12 MED ORDER — VITAMIN D 1000 UNITS PO TABS
2000.0000 [IU] | ORAL_TABLET | Freq: Every day | ORAL | Status: DC
  Filled 2015-06-12: qty 2

## 2015-06-12 MED ORDER — STROKE: EARLY STAGES OF RECOVERY BOOK
Freq: Once | Status: AC
  Administered 2015-06-12: 01:00:00
  Filled 2015-06-12 (×2): qty 1

## 2015-06-12 NOTE — Care Management Note (Signed)
Case Management Note  Patient Details  Name: [name] MRN: [id number] Date of Birth: September 02, 1945  Subjective/Objective:      Patient lives with spouse, PCP is Dr. Renford Dillsonald Polite.  Await pt/ot eval.  NCM will cont to follow for dc needs.              Action/Plan:   Expected Discharge Date:                  Expected Discharge Plan:  Home w Home Health Services  In-House Referral:     Discharge planning Services  CM Consult  Post Acute Care Choice:    Choice offered to:     DME Arranged:    DME Agency:     HH Arranged:    HH Agency:     Status of Service:  In process, will continue to follow  Medicare Important Message Given:    Date Medicare IM Given:    Medicare IM give by:    Date Additional Medicare IM Given:    Additional Medicare Important Message give by:     If discussed at Long Length of Stay Meetings, dates discussed:    Additional Comments:  Leone Havenaylor, Netty Sullivant Clinton, RN 06/12/2015, 6:24 PM

## 2015-06-12 NOTE — Progress Notes (Signed)
Patient seen and examined  Admitting for transient left visual field defect, resolved, sinus rhythm on telemetry  Plan: 1. HgbA1c pending, fasting lipid panel-triglycerides 251, LDL 154, start statin 2. MRI, MRA of the brain without contrast-incomplete MRI, no acute infarct, moderate to severe irregular MCA, 3. PT consult, OT consult, Speech consult pending 4. Echocardiogram pending 5. Carotid dopplers pending 6. Prophylactic therapy-Antiplatelet med: Aspirin

## 2015-06-12 NOTE — Progress Notes (Signed)
Echocardiogram 2D Echocardiogram has been performed.  [name], [name] 06/12/2015, 11:07 AM

## 2015-06-12 NOTE — H&P (Signed)
[facility] History and Physical  [name] UJW:[id number] DOB: May 12, 1946 DOA: 06/11/2015  Referring physician: Dr. Bebe Shaggy. PCP: Katy Apo, MD  Specialists: Dr. Marjory Lies. Neurologist.  Chief Complaint: Left visual changes.  HPI: [name] is a 69 y.o. female with history of hypertension and vertigo persisted the ER because of brief episode of visual changes in the left eye. Patient states around 4 PM patient had brief loss of vision on the left part of the visual field of the left eye with flashes of light. This lasted for 15 minutes and resolved. Denies any headache or any weakness of the upper or lower extremity. Denies any difficulty swallowing or speaking. CT of the head did not show any acute. On-call neurologist Dr. Roseanne Reno was consulted and patient has been admitted for further management of possible TIA versus stroke. Patient also has history of hypertension and has not been taking antihypertensives for last few years because her pressure was much better controlled. In the ER patient's blood pressure is significantly elevated. Denies any palpitations chest pain or shortness of breath. At the time of my exam patient did complain of some left-sided headache.  Review of Systems: As presented in the history of presenting illness, rest negative.  Past Medical History  Diagnosis Date  . PVC (premature ventricular contraction)     intermittent  . Syncope and collapse     last friday had alot of dizziness  . Paroxysmal SVT (supraventricular tachycardia) (HCC)   . High blood pressure   . Dyslipidemia   . Multiple thyroid nodules   . Double vision 10/2014    bilateral  . Depression   . Anxiety    Past Surgical History  Procedure Laterality Date  . Abdominal hysterectomy    . Tonsillectomy    . Catheter ablation     Social History:  reports that she quit smoking about 30 years ago. Her smoking use included Cigarettes. She smoked 0.00 packs per day for 20  years. She has never used smokeless tobacco. She reports that she does not drink alcohol or use illicit drugs. Where does patient live home. Can patient participate in ADLs? Yes.  Allergies  Allergen Reactions  . Epinephrine   . Contrast Media [Iodinated Diagnostic Agents] Itching    Pt had itchy nose and dry throat and was told she was allergic to IV contrast and does not want to have it.  Marland Kitchen Keflex [Cephalexin] Swelling    Family History:  Family History  Problem Relation Age of Onset  . High blood pressure Father   . Stroke Father 19  . CAD Father 20    CABG.  Lived to be 85  . Arthritis Mother   . Neuropathy Mother       Prior to Admission medications   Medication Sig Start Date End Date Taking? Authorizing Provider  BIOTIN PO Take 1 tablet by mouth daily.   Yes Historical Provider, MD  Cholecalciferol (VITAMIN D3) 2000 UNITS TABS Take 2,000 Units by mouth daily.   Yes Historical Provider, MD  diazepam (VALIUM) 5 MG tablet Take 5 mg by mouth every 8 (eight) hours as needed (dizziness).   Yes Historical Provider, MD  meclizine (ANTIVERT) 12.5 MG tablet Take 1 tablet (12.5 mg total) by mouth 3 (three) times daily as needed for dizziness. 11/18/14  Yes April Palumbo, MD  ondansetron (ZOFRAN) 4 MG tablet TAKE 1 TABLET (4 MG TOTAL) BY MOUTH EVERY 8 (EIGHT) HOURS AS NEEDED FOR NAUSEA. 12/31/14  Yes Historical Provider, MD  diltiazem (CARDIZEM) 30 MG tablet Take 1 tablet (30 mg total) by mouth 4 (four) times daily. Patient not taking: Reported on 06/11/2015 09/26/14   Rollene RotundaJames Hochrein, MD    Physical Exam: Filed Vitals:   06/11/15 2215 06/11/15 2252 06/11/15 2253 06/11/15 2329  BP: 167/89 194/75 187/81 181/82  Pulse: 67 73 70 67  Temp:  97.6 F (36.4 C)    TempSrc:  Oral    Resp: 15 18    SpO2: 97% 93%       General:  Moderately built and nourished.  Eyes: Anicteric. No pallor.  ENT: No discharge from the ears eyes nose and mouth.  Neck: No neck rigidity. No mass  felt.  Cardiovascular: S1 and S2 heard.  Respiratory: No rhonchi or crepitations.  Abdomen: Soft nontender bowel sounds present.  Skin: No rash.  Musculoskeletal: No edema.  Psychiatric: Appears normal.  Neurologic: Alert awake oriented to time place and person. Moves all extremity is 5 x 5. No facial asymmetry. Tongue is midline. PERRLA positive.  Labs on Admission:  Basic Metabolic Panel:  Recent Labs Lab 06/11/15 1814  NA 141  K 3.8  CL 107  CO2 26  GLUCOSE 123*  BUN 21*  CREATININE 0.92  CALCIUM 9.4   Liver Function Tests:  Recent Labs Lab 06/11/15 1814  AST 23  ALT 19  ALKPHOS 59  BILITOT 0.4  PROT 6.1*  ALBUMIN 3.8   No results for input(s): LIPASE, AMYLASE in the last 168 hours. No results for input(s): AMMONIA in the last 168 hours. CBC:  Recent Labs Lab 06/11/15 1814  WBC 5.3  NEUTROABS 3.4  HGB 13.0  HCT 38.7  MCV 91.3  PLT 249   Cardiac Enzymes: No results for input(s): CKTOTAL, CKMB, CKMBINDEX, TROPONINI in the last 168 hours.  BNP (last 3 results) No results for input(s): BNP in the last 8760 hours.  ProBNP (last 3 results) No results for input(s): PROBNP in the last 8760 hours.  CBG: No results for input(s): GLUCAP in the last 168 hours.  Radiological Exams on Admission: Ct Head Wo Contrast  06/11/2015  CLINICAL DATA:  Headache, vision changes EXAM: CT HEAD WITHOUT CONTRAST TECHNIQUE: Contiguous axial images were obtained from the base of the skull through the vertex without intravenous contrast. COMPARISON:  11/18/2014 FINDINGS: No skull fracture is noted. Paranasal sinuses and mastoid air cells are unremarkable. No intracranial hemorrhage, mass effect or midline shift. No acute cortical infarction. No mass lesion is noted on this unenhanced scan. Again noted mild periventricular and patchy subcortical chronic white matter disease. IMPRESSION: No acute intracranial abnormality.  No significant change. Electronically Signed   By:  Natasha MeadLiviu  Pop M.D.   On: 06/11/2015 18:30    EKG: Independently reviewed. Normal sinus rhythm.  Assessment/Plan Principal Problem:   TIA (transient ischemic attack) Active Problems:   Essential hypertension   1. TIA - patient's symptoms are concerning for TIA versus migraine. Appreciate neurology consult. Check sedimentation rate. Check MRI/MRA brain 2-D echo carotid Doppler. Patient is placed on aspirin. Check hemoglobin A1c and lipid panel. 2. Uncontrolled hypertension - since patient has possibility of stroke versus TIA for now we will allow for permissive hypertension. I have placed patient on when necessary IV hydralazine for systolic blood pressure more than 210. Closely follow blood pressure trends. 3. History of episodic vertigo being followed by outpatient neurology.  I have reviewed patient's old charts were labs.   DVT Prophylaxis Lovenox.  Code Status: Full code. Patient states her CODE  STATUS has to be further discussed with her husband.  Family Communication: Discussed with patient.  Disposition Plan: Admit for observation.    Rino Hosea N. [facility] Pager [phone].  If 7PM-7AM, please contact night-coverage [email or web address] Password [facility] 06/12/2015, 12:27 AM

## 2015-06-12 NOTE — Progress Notes (Signed)
STROKE TEAM PROGRESS NOTE   HISTORY [name] is a 69 y.o. female with a history of hypertension, hyperlipidemia, vertigo and diplopia, as well as syncope, presenting alone an episode of transient left visual changes which she describes as blurred vision with bright zigzag lines, and inability to see the left side of the images and report writing. Patient has not had symptoms of this sort in the past and has no history of migraine headaches. She has not been on antiplatelet medication. CT scan of her head showed no acute intracranial abnormality. Visual changes resolved without intervention. NIH stroke score was 0.  LSN: 4:30 PM on 06/11/2015 tPA Given: No: Deficits resolved     SUBJECTIVE (INTERVAL HISTORY) No family members present. The patient reports a prior history of diplopia and a history of Mnire's disease. She also reports pain in her left temple  Dr. Leonie Man recommended a sedimentation rate to rule out temporal arteritis.  He feels or visual difficulties might be secondary to atypical migraine headaches. Patient denies symptoms of jaw claudication or scalp tenderness but does admit to muscle aches and pains. She has remote history of TIA in 2005 and was seen by me at that time. She was more recently seen by Dr. Leta Baptist for dizziness   OBJECTIVE Temp:  [97.5 F (36.4 C)-98.1 F (36.7 C)] 97.6 F (36.4 C) (12/16 1239) Pulse Rate:  [65-85] 67 (12/16 0827) Cardiac Rhythm:  [-] Normal sinus rhythm (12/16 0822) Resp:  [12-21] 18 (12/16 0530) BP: (148-194)/(75-96) 154/78 mmHg (12/16 0827) SpO2:  [93 %-100 %] 95 % (12/16 0530) Weight:  [59.966 kg (132 lb 3.2 oz)] 59.966 kg (132 lb 3.2 oz) (12/16 0100)  CBC:   Recent Labs Lab 06/11/15 1814 06/12/15 0525  WBC 5.3 6.5  NEUTROABS 3.4  --   HGB 13.0 13.3  HCT 38.7 39.3  MCV 91.3 90.8  PLT 249 262    Basic Metabolic Panel:   Recent Labs Lab 06/11/15 1814 06/12/15 0525  NA 141 141  K 3.8 3.8  CL 107 105  CO2 26 28   GLUCOSE 123* 99  BUN 21* 15  CREATININE 0.92 0.98  CALCIUM 9.4 9.6    Lipid Panel:     Component Value Date/Time   CHOL 257* 06/12/2015 0525   TRIG 139 06/12/2015 0525   HDL 50 06/12/2015 0525   CHOLHDL 5.1 06/12/2015 0525   VLDL 28 06/12/2015 0525   LDLCALC 179* 06/12/2015 0525   HgbA1c:  Lab Results  Component Value Date   HGBA1C 6.1* 04/06/2011   Urine Drug Screen: No results found for: LABOPIA, COCAINSCRNUR, LABBENZ, AMPHETMU, THCU, LABBARB    IMAGING  Ct Head Wo Contrast 06/11/2015   No acute intracranial abnormality.  No significant change.     MRI/MRA of the head 06/12/2015 1. Incomplete brain MRI as above. No acute infarct. 2. Mild chronic small vessel ischemic disease. 3. New/increased, moderate right P2 PCA stenosis. 4. Moderate to severe irregular narrowing throughout the right MCA branch vessels, similar to prior. 5. Mild stenoses of the proximal cavernous right ICA and proximal right ACA.   PHYSICAL EXAM Pleasant middle-age Caucasian lady not in distress. . Afebrile. Head is nontraumatic. Neck is supple without bruit.    Cardiac exam no murmur or gallop. Lungs are clear to auscultation. Distal pulses are well felt. No temporal artery tenderness  Neurological Exam :    Neurological Exam ;  Awake  Alert oriented x 3. Normal speech and language.eye movements full without nystagmus.fundi were  not visualized. Vision acuity and fields appear normal. Hearing is normal. Palatal movements are normal. Face symmetric. Tongue midline. Normal strength, tone, reflexes and coordination. Normal sensation. Gait deferred.   ASSESSMENT/PLAN [name] is a 69 y.o. female with history of hypertension, hyperlipidemia, vertigo and diplopia, as well as syncope,  presenting with visual disturbances. She did not receive IV t-PA due to resolution of deficits.  TIA versus atypical migraine  Resultant  solution of deficits  MRI  no acute infarct  MRA  Moderate  to severe irregular narrowing throughout the right MCA branch vessels,  Carotid Doppler  Bilateral: 1-39% ICA stenosis. Vertebral artery flow is antegrade.   2D Echo pending  Sedimentation rate - 10  LDL 179  HgbA1c pending  VTE prophylaxis - Lovenox   Diet Heart Room service appropriate?: Yes; Fluid consistency:: Thin  No antithrombotic prior to admission, now on aspirin 325 mg daily  Patient counseled to be compliant with her antithrombotic medications  Ongoing aggressive stroke risk factor management  Therapy recommendations: Pending  Disposition:  Pending  Hypertension  Mildly high blood pressures at times  Permissive hypertension (OK if < 220/120) but gradually normalize in 5-7 days  Hyperlipidemia  Home meds: No lipid lowering medications prior to admission  LDL 179, goal < 70  Now on Lipitor 40 mg daily  Continue statin at discharge     Other Stroke Risk Factors  Advanced age  Cigarette smoker, quit smoking 30 years ago   Family hx stroke (father)    PLAN  Sign off if echocardiogram is unremarkable.  Hospital day #   Mikey Bussing PA-C [facility] Pager [phone] 06/12/2015, 3:30 PM I have personally examined this patient, reviewed notes, independently viewed imaging studies, participated in medical decision making and plan of care. I have made any additions or clarifications directly to the above note. Agree with note above. She presented with transient left-sided vision disturbance with this exam lines and trouble writing possibly complicated or visual migraine episode though she has had previous history of TIAs and has cerebrovascular disease she is at risk for neurological worsening, recurrent stroke, TIA needs ongoing evaluation. She was advised to take aspirin 81 mg daily and finish ongoing workup. Temporal arteritis would be unlikely with ESR of 10 mm Follow-up as an outpatient with Dr. Leverne Humbles,  MD Medical Director [place] Pager: [phone] 06/12/2015 3:59 PM     To contact Stroke Continuity provider, please refer to [email or web address] After hours, contact General Neurology

## 2015-06-12 NOTE — Progress Notes (Signed)
VASCULAR LAB PRELIMINARY  PRELIMINARY  PRELIMINARY  PRELIMINARY  Carotid duplex completed.    Preliminary report:  Bilateral:  1-39% ICA stenosis.  Vertebral artery flow is antegrade.     Bueford Arp, RVS 06/12/2015, 3:02 PM

## 2015-06-13 DIAGNOSIS — I10 Essential (primary) hypertension: Secondary | ICD-10-CM | POA: Diagnosis not present

## 2015-06-13 DIAGNOSIS — G459 Transient cerebral ischemic attack, unspecified: Secondary | ICD-10-CM | POA: Diagnosis not present

## 2015-06-13 LAB — HEMOGLOBIN A1C
Hgb A1c MFr Bld: 6.4 % — ABNORMAL HIGH (ref 4.8–5.6)
Mean Plasma Glucose: 137 mg/dL

## 2015-06-13 MED ORDER — AMLODIPINE BESYLATE 2.5 MG PO TABS: 2.5000 mg | ORAL_TABLET | Freq: Every day | ORAL | Status: DC

## 2015-06-13 MED ORDER — ASPIRIN EC 81 MG PO TBEC: 81.0000 mg | DELAYED_RELEASE_TABLET | Freq: Every day | ORAL | Status: DC

## 2015-06-13 MED ORDER — ASPIRIN 81 MG PO TBEC: 81.0000 mg | DELAYED_RELEASE_TABLET | Freq: Every day | ORAL | Status: DC

## 2015-06-13 MED ORDER — ATORVASTATIN CALCIUM 40 MG PO TABS: 40.0000 mg | ORAL_TABLET | Freq: Every day | ORAL | Status: DC

## 2015-06-13 NOTE — Discharge Summary (Signed)
Physician Discharge Summary  [name] ZOX:[id number] DOB: [birth year] DOA: 06/11/2015  PCP: Katy Apo, MD  Admit date: 06/11/2015 Discharge date: 06/13/2015  Time spent: >35 minutes  Recommendations for Outpatient Follow-up:  F/u with PCP in 3-7 days as needed F/u with Neurologist in 3-4 weeks  Discharge Diagnoses:  Principal Problem:   TIA (transient ischemic attack) Active Problems:   Essential hypertension   Amaurosis fugax   Atypical migraine   Discharge Condition: stable   Diet recommendation: heart healthy   Filed Weights   06/12/15 0100  Weight: 59.966 kg (132 lb 3.2 oz)    History of present illness:  69 y.o. female with a history of HTN, HPL, Vertigo and diplopia, presenting with an episode of transient left visual changes which she describes as blurred vision with bright zigzag lines, and inability to see the left side of the images.she has no history of migraine headaches. She has not been on antiplatelet medication. CT scan of her head showed no acute intracranial abnormality. Visual changes resolved without intervention. NIH stroke score was 0  Hospital Course:  1. Probable TIA. MRI brain: no acute infarcts. MRA: New/increased, moderate right P2 PCA stenosis. Moderate to severe irregular narrowing throughout the right MCA branch vessels, similar to prior. Mild stenoses of the proximal cavernous right ICA and proximalight ACA. Carotid  US: <50% BL. Echo: LVEF 65-70%. HDL-50. LDL-179.  -Symptoms have resolved prior to admission. No further neurological symptoms observed while inpatient,. Neuro exam is non focal. Patient was not taking aspirin prior to admission. She is started on ASA, statins. Neurology reccommended outpatient follow up    2. HTN uncontrolled on admission.  Work up for acute CVA is negative. -Patient was not on meds at home. Started low dose amlodipine. Titrate as needed as outpatient  3. HPL. Started on statin 4. Peripheral vertigo. Cont  outpatient follow up with ENT  Procedures:  Echo Study Conclusions  - Left ventricle: The cavity size was normal. Wall thickness was normal. Systolic function was vigorous. The estimated ejection fraction was in the range of 65% to 70%. Doppler parameters are consistent with abnormal left ventricular relaxation (grade 1 diastolic dysfunction). - Aortic valve: There was trivial regurgitation. (i.e. Studies not automatically included, echos, thoracentesis, etc; not x-rays)  Consultations:  neurolgoy   Discharge Exam: Filed Vitals:   06/12/15 2120 06/13/15 0535  BP: 141/64 146/69  Pulse: 64 64  Temp: 97.6 F (36.4 C) 97.6 F (36.4 C)  Resp: 20 20    General: alert. No distress  Cardiovascular: s1,s2 rrr Respiratory: CTA BL  Discharge Instructions  Discharge Instructions    Diet - low sodium heart healthy    Complete by:  As directed      Discharge instructions    Complete by:  As directed   Please follow up with neurologist in 3-4 weeks     Increase activity slowly    Complete by:  As directed             Medication List    STOP taking these medications        diltiazem 30 MG tablet  Commonly known as:  CARDIZEM      TAKE these medications        amLODipine 2.5 MG tablet  Commonly known as:  NORVASC  Take 1 tablet (2.5 mg total) by mouth daily.     aspirin 81 MG EC tablet  Take 1 tablet (81 mg total) by mouth daily.     atorvastatin  40 MG tablet  Commonly known as:  LIPITOR  Take 1 tablet (40 mg total) by mouth daily at 6 PM.     BIOTIN PO  Take 1 tablet by mouth daily.     diazepam 5 MG tablet  Commonly known as:  VALIUM  Take 5 mg by mouth every 8 (eight) hours as needed (dizziness).     meclizine 12.5 MG tablet  Commonly known as:  ANTIVERT  Take 1 tablet (12.5 mg total) by mouth 3 (three) times daily as needed for dizziness.     ondansetron 4 MG tablet  Commonly known as:  ZOFRAN  TAKE 1 TABLET (4 MG TOTAL) BY MOUTH EVERY 8  (EIGHT) HOURS AS NEEDED FOR NAUSEA.     Vitamin D3 2000 UNITS Tabs  Take 2,000 Units by mouth daily.       Allergies  Allergen Reactions  . Epinephrine   . Contrast Media [Iodinated Diagnostic Agents] Itching    Pt had itchy nose and dry throat and was told she was allergic to IV contrast and does not want to have it.  Marland Kitchen. Keflex [Cephalexin] Swelling      The results of significant diagnostics from this hospitalization (including imaging, microbiology, ancillary and laboratory) are listed below for reference.    Significant Diagnostic Studies: Ct Head Wo Contrast  06/11/2015  CLINICAL DATA:  Headache, vision changes EXAM: CT HEAD WITHOUT CONTRAST TECHNIQUE: Contiguous axial images were obtained from the base of the skull through the vertex without intravenous contrast. COMPARISON:  11/18/2014 FINDINGS: No skull fracture is noted. Paranasal sinuses and mastoid air cells are unremarkable. No intracranial hemorrhage, mass effect or midline shift. No acute cortical infarction. No mass lesion is noted on this unenhanced scan. Again noted mild periventricular and patchy subcortical chronic white matter disease. IMPRESSION: No acute intracranial abnormality.  No significant change. Electronically Signed   By: Natasha MeadLiviu  Pop M.D.   On: 06/11/2015 18:30   Mr Brain Wo Contrast  06/12/2015  CLINICAL DATA:  15 minutes long episode of left eye visual changes. TIA. EXAM: MRI HEAD WITHOUT CONTRAST MRA HEAD WITHOUT CONTRAST TECHNIQUE: Multiplanar, multiecho pulse sequences of the brain and surrounding structures were obtained without intravenous contrast. Angiographic images of the head were obtained using MRA technique without contrast. COMPARISON:  Head CT 06/11/2015.  Head MRI/ MRA 12/25/2014. FINDINGS: MRI HEAD FINDINGS The examination had to be discontinued prior to completion due to patient experiencing a warm sensation in the upper back. Sagittal T1 and axial diffusion, T2, and FLAIR images were  obtained. There is no evidence of acute infarct, intracranial hemorrhage, mass, midline shift, or extra-axial fluid collection. Ventricles and sulci are normal for age. Small foci of T2 hyperintensity scattered throughout the white matter of the left greater than right cerebral hemispheres are unchanged from the prior MRI and nonspecific but compatible with mild chronic small vessel ischemic disease. Orbits are unremarkable. Paranasal sinuses and mastoid air cells are clear. Major intracranial vascular flow voids are preserved. MRA HEAD FINDINGS The visualized distal vertebral arteries are patent with the right being dominant. Right PICA origin is patent. Visualized left PICA appears patent although its origin was incompletely imaged. A small fenestration is again seen in the proximal basilar artery. AICA and SCA origins are patent. Basilar artery is patent without stenosis. There is a fetal origin of the left PCA. There is a moderate right P2 stenosis, increased in severity from the prior study. Internal carotid arteries are patent from skullbase to carotid termini.  Mild proximal right cavernous ICA stenosis is unchanged. 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 Right M1 segment is patent without significant significant stenosis, however moderate to severe diffuse irregularity and attenuation are again seen throughout the right MCA branch vessels. Mild right ACA origin stenosis has not significantly changed. No significant left ACA stenosis. No intracranial aneurysm identified. IMPRESSION: 1. Incomplete brain MRI as above.  No acute infarct. 2. Mild chronic small vessel ischemic disease. 3. New/increased, moderate right P2 PCA stenosis. 4. Moderate to severe irregular narrowing throughout the right MCA branch vessels, similar to prior. 5. Mild stenoses of the proximal cavernous right ICA and proximal right ACA. Electronically Signed   By: Sebastian Ache M.D.   On:  06/12/2015 13:07   Mr Maxine Glenn Head/brain Wo Cm  06/12/2015  CLINICAL DATA:  15 minutes long episode of left eye visual changes. TIA. EXAM: MRI HEAD WITHOUT CONTRAST MRA HEAD WITHOUT CONTRAST TECHNIQUE: Multiplanar, multiecho pulse sequences of the brain and surrounding structures were obtained without intravenous contrast. Angiographic images of the head were obtained using MRA technique without contrast. COMPARISON:  Head CT 06/11/2015.  Head MRI/ MRA 12/25/2014. FINDINGS: MRI HEAD FINDINGS The examination had to be discontinued prior to completion due to patient experiencing a warm sensation in the upper back. Sagittal T1 and axial diffusion, T2, and FLAIR images were obtained. There is no evidence of acute infarct, intracranial hemorrhage, mass, midline shift, or extra-axial fluid collection. Ventricles and sulci are normal for age. Small foci of T2 hyperintensity scattered throughout the white matter of the left greater than right cerebral hemispheres are unchanged from the prior MRI and nonspecific but compatible with mild chronic small vessel ischemic disease. Orbits are unremarkable. Paranasal sinuses and mastoid air cells are clear. Major intracranial vascular flow voids are preserved. MRA HEAD FINDINGS The visualized distal vertebral arteries are patent with the right being dominant. Right PICA origin is patent. Visualized left PICA appears patent although its origin was incompletely imaged. A small fenestration is again seen in the proximal basilar artery. AICA and SCA origins are patent. Basilar artery is patent without stenosis. There is a fetal origin of the left PCA. There is a moderate right P2 stenosis, increased in severity from the prior study. Internal carotid arteries are patent from skullbase to carotid termini. Mild proximal right cavernous ICA stenosis is unchanged. 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 Right M1 segment is patent without  significant significant stenosis, however moderate to severe diffuse irregularity and attenuation are again seen throughout the right MCA branch vessels. Mild right ACA origin stenosis has not significantly changed. No significant left ACA stenosis. No intracranial aneurysm identified. IMPRESSION: 1. Incomplete brain MRI as above.  No acute infarct. 2. Mild chronic small vessel ischemic disease. 3. New/increased, moderate right P2 PCA stenosis. 4. Moderate to severe irregular narrowing throughout the right MCA branch vessels, similar to prior. 5. Mild stenoses of the proximal cavernous right ICA and proximal right ACA. Electronically Signed   By: Sebastian Ache M.D.   On: 06/12/2015 13:07    Microbiology: No results found for this or any previous visit (from the past 240 hour(s)).   Labs: Basic Metabolic Panel:  Recent Labs Lab 06/11/15 1814 06/12/15 0525  NA 141 141  K 3.8 3.8  CL 107 105  CO2 26 28  GLUCOSE 123* 99  BUN 21* 15  CREATININE 0.92 0.98  CALCIUM 9.4 9.6  Liver Function Tests:  Recent Labs Lab 06/11/15 1814 06/12/15 0525  AST 23 20  ALT 19 19  ALKPHOS 59 61  BILITOT 0.4 0.7  PROT 6.1* 6.4*  ALBUMIN 3.8 3.7   No results for input(s): LIPASE, AMYLASE in the last 168 hours. No results for input(s): AMMONIA in the last 168 hours. CBC:  Recent Labs Lab 06/11/15 1814 06/12/15 0525  WBC 5.3 6.5  NEUTROABS 3.4  --   HGB 13.0 13.3  HCT 38.7 39.3  MCV 91.3 90.8  PLT 249 233   Cardiac Enzymes: No results for input(s): CKTOTAL, CKMB, CKMBINDEX, TROPONINI in the last 168 hours. BNP: BNP (last 3 results) No results for input(s): BNP in the last 8760 hours.  ProBNP (last 3 results) No results for input(s): PROBNP in the last 8760 hours.  CBG: No results for input(s): GLUCAP in the last 168 hours.     SignedEsperanza Sheets  [facility] 06/13/2015, 10:12 AM

## 2015-06-13 NOTE — Progress Notes (Signed)
Stroke Team - Sign Off Note  2 D Echo unremarkable. Hemoglobin A1c 6.4. Per Dr. Marlis EdelsonSethi's note yesterday the stroke team will now sign off. The patient should follow-up in the neurology office with Dr. Marjory LiesPenumalli in 2-4 weeks. Per Dr. Pearlean BrownieSethi recommend aspirin 81 mg daily.  Please call if we can be of further service.  Delton Seeavid Rinehuls PA-C [facility] Pager [phone] 06/13/2015, 9:23 AM

## 2015-06-13 NOTE — Progress Notes (Signed)
PT Cancellation Note  Patient Details Name: Kristie OmanClaudia Taylor MRN: [id number] DOB: 08/24/1945   Cancelled Treatment:    Reason Eval/Treat Not Completed: PT screened, no needs identified, will sign off. Pt mobilizing independently without problems. Visual changes resolved.   Moris Ratchford 06/13/2015, 11:14 AM Skip Mayerary Vallorie Niccoli PT [phone]

## 2015-06-13 NOTE — Progress Notes (Signed)
Nsg Discharge Note  Admit Date:  06/11/2015 Discharge date: 06/13/2015   [name] to be D/C'd Home per MD order.  AVS completed.  Copy for chart, and copy for patient signed, and dated. Patient/caregiver able to verbalize understanding.  Discharge Medication:   Medication List    STOP taking these medications        diltiazem 30 MG tablet  Commonly known as:  CARDIZEM      TAKE these medications        amLODipine 2.5 MG tablet  Commonly known as:  NORVASC  Take 1 tablet (2.5 mg total) by mouth daily.     aspirin 81 MG EC tablet  Take 1 tablet (81 mg total) by mouth daily.     atorvastatin 40 MG tablet  Commonly known as:  LIPITOR  Take 1 tablet (40 mg total) by mouth daily at 6 PM.     BIOTIN PO  Take 1 tablet by mouth daily.     diazepam 5 MG tablet  Commonly known as:  VALIUM  Take 5 mg by mouth every 8 (eight) hours as needed (dizziness).     meclizine 12.5 MG tablet  Commonly known as:  ANTIVERT  Take 1 tablet (12.5 mg total) by mouth 3 (three) times daily as needed for dizziness.     ondansetron 4 MG tablet  Commonly known as:  ZOFRAN  TAKE 1 TABLET (4 MG TOTAL) BY MOUTH EVERY 8 (EIGHT) HOURS AS NEEDED FOR NAUSEA.     Vitamin D3 2000 UNITS Tabs  Take 2,000 Units by mouth daily.        Discharge Assessment: Filed Vitals:   06/12/15 2120 06/13/15 0535  BP: 141/64 146/69  Pulse: 64 64  Temp: 97.6 F (36.4 C) 97.6 F (36.4 C)  Resp: 20 20   Skin clean, dry and intact without evidence of skin break down, no evidence of skin tears noted. IV catheter discontinued intact. Site without signs and symptoms of complications - no redness or edema noted at insertion site, patient denies c/o pain - only slight tenderness at site.  Dressing with slight pressure applied.  D/c Instructions-Education: Discharge instructions given to patient/family with verbalized understanding. D/c education completed with patient/family including follow up instructions,  medication list, d/c activities limitations if indicated, with other d/c instructions as indicated by MD - patient able to verbalize understanding, all questions fully answered. Patient instructed to return to ED, call 911, or call MD for any changes in condition.  Patient escorted walking to elevator with husband, and D/C home via private auto.  Camillo FlamingVicki L Johnnathan Hagemeister, RN 06/13/2015 11:27 AM

## 2015-07-20 ENCOUNTER — Ambulatory Visit (INDEPENDENT_AMBULATORY_CARE_PROVIDER_SITE_OTHER): Payer: Medicare Other | Admitting: Diagnostic Neuroimaging

## 2015-07-20 ENCOUNTER — Encounter: Payer: Self-pay | Admitting: Diagnostic Neuroimaging

## 2015-07-20 VITALS — BP 151/73 | HR 70 | Ht 61.0 in | Wt 135.8 lb

## 2015-07-20 DIAGNOSIS — I10 Essential (primary) hypertension: Secondary | ICD-10-CM | POA: Diagnosis not present

## 2015-07-20 DIAGNOSIS — E785 Hyperlipidemia, unspecified: Secondary | ICD-10-CM | POA: Diagnosis not present

## 2015-07-20 DIAGNOSIS — G458 Other transient cerebral ischemic attacks and related syndromes: Secondary | ICD-10-CM

## 2015-07-20 DIAGNOSIS — E1169 Type 2 diabetes mellitus with other specified complication: Secondary | ICD-10-CM | POA: Diagnosis not present

## 2015-07-20 NOTE — Progress Notes (Signed)
[facility]  PATIENT: [name] DOB: April 09, 1946  REFERRING CLINICIAN:  HISTORY FROM: patient and husband  REASON FOR VISIT: follow up   HISTORICAL  CHIEF COMPLAINT:  Chief Complaint  Patient presents with  . Hospital FU, TIA vs ocular migraine    rm 7, husband-Alan    HISTORY OF PRESENT ILLNESS:   UPDATE 07/20/15: Since last visit, was in the hospital for visual disturbance (colors, zig-zags, blurred vision) x 20 minutes. No headache. Admitted in 06/11/15 to hospital. 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 No recurrent symptoms. Reluctant to start BP med and statin.   PRIOR HPI (01/13/15): 70 year old right-handed female here for evaluation of dizziness, double vision, hearing loss, ringing in ears. 11/12/2014 patient had pressure and fullness sensation in her left ear. Within a couple of days she developed significant vertigo attack with nausea, vomiting, balance difficulty. She had decreased hearing in the left ear. She is at intermittent double vision and tilted vision. Patient went to ENT locally as well as at [facility] for evaluation. She was diagnosed with Mnire's disease and prescribed Valium and diuretic which she took for a few days and then stopped. Patient was also recommended to have MRI of the brain, however she canceled this due to concern about this possibly damaging her hearing further. Patient also went to ophthalmologist for evaluation of double vision. No specific eye related pathology was found. Patient was then referred to me for further evaluation. No prodromal trauma, infections, triggering factors. Patient has a history of TIA in the past. She also has history of cervical spinal stenosis. She's had some milder balance problems as far back as 2014 and previously saw my colleague Dr. Terrace Arabia.   REVIEW OF SYSTEMS: Full 14 system review of systems performed and notable only for hearing loss ringing in ears blurred vision joint pain aching muscles muscle  cramps temp intolerance.    ALLERGIES: Allergies  Allergen Reactions  . Epinephrine   . Contrast Media [Iodinated Diagnostic Agents] Itching    Pt had itchy nose and dry throat and was told she was allergic to IV contrast and does not want to have it.  Marland Kitchen Keflex [Cephalexin] Swelling    HOME MEDICATIONS: Outpatient Prescriptions Prior to Visit  Medication Sig Dispense Refill  . aspirin EC 81 MG EC tablet Take 1 tablet (81 mg total) by mouth daily. 30 tablet 1  . BIOTIN PO Take 1 tablet by mouth daily.    . Cholecalciferol (VITAMIN D3) 2000 UNITS TABS Take 2,000 Units by mouth daily.    . diazepam (VALIUM) 5 MG tablet Take 5 mg by mouth every 8 (eight) hours as needed (dizziness).    . meclizine (ANTIVERT) 12.5 MG tablet Take 1 tablet (12.5 mg total) by mouth 3 (three) times daily as needed for dizziness. 15 tablet 0  . ondansetron (ZOFRAN) 4 MG tablet TAKE 1 TABLET (4 MG TOTAL) BY MOUTH EVERY 8 (EIGHT) HOURS AS NEEDED FOR NAUSEA.  0  . amLODipine (NORVASC) 2.5 MG tablet Take 1 tablet (2.5 mg total) by mouth daily. (Patient not taking: Reported on 07/20/2015) 30 tablet 1  . atorvastatin (LIPITOR) 40 MG tablet Take 1 tablet (40 mg total) by mouth daily at 6 PM. (Patient not taking: Reported on 07/20/2015) 30 tablet 1   No facility-administered medications prior to visit.    PAST MEDICAL HISTORY: Past Medical History  Diagnosis Date  . PVC (premature ventricular contraction)     intermittent  . Syncope and collapse  last friday had alot of dizziness  . Paroxysmal SVT (supraventricular tachycardia) (HCC)   . High blood pressure   . Dyslipidemia   . Multiple thyroid nodules   . Double vision 10/2014    bilateral  . Depression   . Anxiety     PAST SURGICAL HISTORY: Past Surgical History  Procedure Laterality Date  . Abdominal hysterectomy    . Tonsillectomy    . Catheter ablation      FAMILY HISTORY: Family History  Problem Relation Age of Onset  . High blood pressure  Father   . Stroke Father 87  . CAD Father 22    CABG.  Lived to be 85  . Arthritis Mother   . Neuropathy Mother     SOCIAL HISTORY:  Social History   Social History  . Marital Status: Married    Spouse Name: [name]  . Number of Children: 0  . Years of Education: 12   Occupational History  .      retired   Social History Main Topics  . Smoking status: Former Smoker -- 0.00 packs/day for 20 years    Types: Cigarettes    Quit date: 12/11/1984  . Smokeless tobacco: Never Used     Comment: Quit 40 years ago  . Alcohol Use: No  . Drug Use: No  . Sexual Activity: Not on file   Other Topics Concern  . Not on file   Social History Narrative   Patient lives at home with [name]). Patient is retired.      PHYSICAL EXAM  GENERAL EXAM/CONSTITUTIONAL: Vitals:  Filed Vitals:   07/20/15 1449 07/20/15 1455  BP: 137/74 151/73  Pulse: 68 70  Height:  (1.549 m)   Weight: 135 lb 12.8 oz (61.598 kg)    Body mass index is 25.67 kg/(m^2). No exam data present  Patient is in no distress; well developed, nourished and groomed; neck is supple  CARDIOVASCULAR:  Examination of carotid arteries is normal; no carotid bruits  Regular rate and rhythm, no murmurs  Examination of peripheral vascular system by observation and palpation is normal  EYES:  Ophthalmoscopic exam of optic discs and posterior segments is normal; no papilledema or hemorrhages  MUSCULOSKELETAL:  Gait, strength, tone, movements noted in Neurologic exam below  NEUROLOGIC: MENTAL STATUS:  No flowsheet data found.  awake, alert, oriented to person, place and time  recent and remote memory intact  normal attention and concentration  language fluent, comprehension intact, naming intact,   fund of knowledge appropriate  CRANIAL NERVE:   2nd - no papilledema on fundoscopic exam  2nd, 3rd, 4th, 6th - pupils equal and reactive to light, visual fields full to confrontation, extraocular  muscles intact, no nystagmus  5th - facial sensation symmetric  7th - facial strength symmetric  8th - hearing intact to whispher  9th - palate elevates symmetrically, uvula midline  11th - shoulder shrug symmetric  12th - tongue protrusion midline  MOTOR:   normal bulk and tone, full strength in the BUE, BLE  SENSORY:   normal and symmetric to light touch, temperature, vibration  COORDINATION:   finger-nose-finger, fine finger movements normal  REFLEXES:   deep tendon reflexes TRACE AT BUE AND KNEES; ABSENT AT ANKLES  GAIT/STATION:   narrow based gait; able to walk on toes, heels; SLIGHT DIFF WITH TANDEM; romberg is negative    DIAGNOSTIC DATA (LABS, IMAGING, TESTING) - I reviewed patient records, labs, notes, testing and imaging myself where available.  Lab Results  Component Value Date   WBC 6.5 06/12/2015   HGB 13.3 06/12/2015   HCT 39.3 06/12/2015   MCV 90.8 06/12/2015   PLT 233 06/12/2015      Component Value Date/Time   NA 141 06/12/2015 0525   K 3.8 06/12/2015 0525   CL 105 06/12/2015 0525   CO2 28 06/12/2015 0525   GLUCOSE 99 06/12/2015 0525   BUN 15 06/12/2015 0525   CREATININE 0.98 06/12/2015 0525   CREATININE 0.79 09/29/2014 0959   CALCIUM 9.6 06/12/2015 0525   PROT 6.4* 06/12/2015 0525   ALBUMIN 3.7 06/12/2015 0525   AST 20 06/12/2015 0525   ALT 19 06/12/2015 0525   ALKPHOS 61 06/12/2015 0525   BILITOT 0.7 06/12/2015 0525   GFRNONAA 57* 06/12/2015 0525   GFRAA >60 06/12/2015 0525   Lab Results  Component Value Date   CHOL 257* 06/12/2015   HDL 50 06/12/2015   LDLCALC 179* 06/12/2015   TRIG 139 06/12/2015   CHOLHDL 5.1 06/12/2015   Lab Results  Component Value Date   HGBA1C 6.4* 06/12/2015   Lab Results  Component Value Date   VITAMINB12 522 04/07/2011   Lab Results  Component Value Date   TSH 2.858 09/29/2014    11/18/14 CT head [I reviewed images myself and agree with interpretation. -VRP]  1. No acute findings. 2.  Chronic small vessel disease, stable from brain MRI March 2015.  09/12/14 MRI brain [I reviewed images myself and agree with interpretation. -VRP]  1. Mild scattered periventricular and subcortical foci of T2 hyperintensities. These findings are non-specific and considerations include autoimmune, inflammatory, post-infectious, microvascular ischemic or migraine associated etiologies..  2. No significant change from MRI on 04/06/11.  06/12/15 MRI brain / MRA head [I reviewed images myself and agree with interpretation. -VRP]   1. Incomplete brain MRI as above. No acute infarct. 2. Mild chronic small vessel ischemic disease. 3. New/increased, moderate right P2 PCA stenosis. 4. Moderate to severe irregular narrowing throughout the right MCA branch vessels, similar to prior. 5. Mild stenoses of the proximal cavernous right ICA and proximal right ACA.  06/12/15 carotid u/s  - Bilateral - 1% to 39% ICA stenosis. Vertebral arery flow is antegrade.  06/12/15 TTE  - Left ventricle: The cavity size was normal. Wall thickness was normal. Systolic function was vigorous. The estimated ejection fraction was in the range of 65% to 70%. Doppler parameters are consistent with abnormal left ventricular relaxation (grade 1 diastolic dysfunction). - Aortic valve: There was trivial regurgitation.    ASSESSMENT AND PLAN  70 y.o. year old female here with intermittent episodes of vertigo, nausea, vomiting, hearing loss, tinnitus, most likely represents Mnire's disease. Now with transient visual disturbance, possibly right occipital TIA vs ocular migraine. Needs aggressive risk factor control.   Ddx: TIA vs ocular migraine  Other specified transient cerebral ischemias  Accelerated hypertension  Hyperlipidemia associated with type 2 diabetes mellitus (HCC)    PLAN: - continue aspirin  - start statin - start BP meds  - consider sleep study (h/o OSA on CPAP > 5 years ago)  Return if  symptoms worsen or fail to improve, for return to PCP.  I reviewed images, labs, notes, records myself. I summarized findings and reviewed with patient, for this high risk condition (TIA vs ocular migraine) requiring high complexity decision making.    Suanne Marker, MD 07/20/2015, 3:23 PM Certified in Neurology, Neurophysiology and Neuroimaging  [facility] [address] [place],  [place] [address] [phone]

## 2015-07-20 NOTE — Patient Instructions (Signed)
Thank you for coming to see [place] at [facility]. I hope we have been able to provide you high quality care today.  You may receive a patient satisfaction survey over the next few weeks. We would appreciate your feedback and comments so that we may continue to improve ourselves and the health of our patients.  - continue aspirin - start statin, BP meds - consider sleep study   ~~~~~~~~~~~~~~~~~~~~~~~~~~~~~~~~~~~~~~~~~~~~~~~~~~~~~~~~~~~~~~~~~  DR. Gwynneth Fabio'S GUIDE TO HAPPY AND HEALTHY LIVING These are some of my general health and wellness recommendations. Some of them may apply to you better than others. Please use common sense as you try these suggestions and feel free to ask me any questions.   ACTIVITY/FITNESS Mental, social, emotional and physical stimulation are very important for brain and body health. Try learning a new activity (arts, music, language, sports, games).  Keep moving your body to the best of your abilities. You can do this at home, inside or outside, the park, community center, gym or anywhere you like. Consider a physical therapist or personal trainer to get started. Consider the app Sworkit. Fitness trackers such as smart-watches, smart-phones or Fitbits can help as well.   NUTRITION Eat more plants: colorful vegetables, nuts, seeds and berries.  Eat less sugar, salt, preservatives and processed foods.  Avoid toxins such as cigarettes and alcohol.  Drink water when you are thirsty. Warm water with a slice of lemon is an excellent morning drink to start the day.  Consider these websites for more information The Nutrition Source (https://www.henry-hernandez.biz/) Precision Nutrition (WindowBlog.ch)   RELAXATION Consider practicing mindfulness meditation or other relaxation techniques such as deep breathing, prayer, yoga, tai chi, massage. See website mindful.org or the apps Headspace or Calm to help  get started.   SLEEP Try to get at least 7-8+ hours sleep per day. Regular exercise and reduced caffeine will help you sleep better. Practice good sleep hygeine techniques. See website sleep.org for more information.   PLANNING Prepare estate planning, living will, healthcare POA documents. Sometimes this is best planned with the help of an attorney. Theconversationproject.org and agingwithdignity.org are excellent resources.

## 2017-03-09 ENCOUNTER — Encounter: Payer: Self-pay | Admitting: Physical Therapy

## 2017-03-09 NOTE — Therapy (Signed)
[facility] [address] [place], [place], [address] Phone: [phone]   Fax:  [phone]  Patient Details  Name: Kristie OmanClaudia Taylor MRN: [id number] Date of Birth: [birth year] Referring Provider:  No ref. provider found  Encounter Date: 03/09/2017    Pt did not return for final PT appt so LTG's unable to be assessed -- pt attended 3 visits in OP PT following initial eval on 02-23-15.  Last PT visit on 03-18-15.    Kary KosDilday, Tayo Maute Suzanne, PT 03/09/2017, 3:53 PM  [facility] [address] [place], [place], [address] Phone: [phone]   Fax:  [phone]

## 2018-01-16 ENCOUNTER — Other Ambulatory Visit: Payer: Self-pay | Admitting: Internal Medicine

## 2018-01-16 DIAGNOSIS — H938X1 Other specified disorders of right ear: Secondary | ICD-10-CM

## 2018-01-16 DIAGNOSIS — E041 Nontoxic single thyroid nodule: Secondary | ICD-10-CM

## 2018-01-25 ENCOUNTER — Ambulatory Visit
Admission: RE | Admit: 2018-01-25 | Discharge: 2018-01-25 | Disposition: A | Discharge status: Home / Self Care | Payer: Medicare Other | Source: Ambulatory Visit | Attending: Internal Medicine | Admitting: Internal Medicine

## 2018-01-25 DIAGNOSIS — E041 Nontoxic single thyroid nodule: Secondary | ICD-10-CM

## 2018-01-25 DIAGNOSIS — H938X1 Other specified disorders of right ear: Secondary | ICD-10-CM

## 2018-01-29 ENCOUNTER — Other Ambulatory Visit: Payer: Self-pay | Admitting: Internal Medicine

## 2018-01-29 DIAGNOSIS — Z1231 Encounter for screening mammogram for malignant neoplasm of breast: Secondary | ICD-10-CM

## 2018-02-07 ENCOUNTER — Ambulatory Visit
Admission: RE | Admit: 2018-02-07 | Discharge: 2018-02-07 | Disposition: A | Discharge status: Home / Self Care | Payer: Medicare Other | Source: Ambulatory Visit | Attending: Internal Medicine | Admitting: Internal Medicine

## 2018-02-07 DIAGNOSIS — Z1231 Encounter for screening mammogram for malignant neoplasm of breast: Secondary | ICD-10-CM

## 2019-01-18 ENCOUNTER — Other Ambulatory Visit: Payer: Self-pay

## 2019-01-21 ENCOUNTER — Other Ambulatory Visit: Payer: Self-pay

## 2019-01-21 ENCOUNTER — Encounter: Payer: Self-pay | Admitting: Obstetrics & Gynecology

## 2019-01-21 ENCOUNTER — Ambulatory Visit (INDEPENDENT_AMBULATORY_CARE_PROVIDER_SITE_OTHER): Payer: Medicare Other | Admitting: Obstetrics & Gynecology

## 2019-01-21 VITALS — BP 150/90 | Ht 60.0 in | Wt 139.0 lb

## 2019-01-21 DIAGNOSIS — N952 Postmenopausal atrophic vaginitis: Secondary | ICD-10-CM

## 2019-01-21 DIAGNOSIS — F331 Major depressive disorder, recurrent, moderate: Secondary | ICD-10-CM

## 2019-01-21 MED ORDER — ESCITALOPRAM OXALATE 10 MG PO TABS: 20.0000 mg | ORAL_TABLET | Freq: Every day | ORAL | 5 refills | Status: DC

## 2019-01-21 NOTE — Progress Notes (Signed)
    [name] [birth year] [id number]   History:    73 y.o. G0 Married  RP:  New patient presenting for Depression and Menopausal Sxs  HPI: S/P Total Hysterectomy at 73 yo and Menopause at 73 yo.  Never been on HRT.  No pelvic pain.  Breasts normal.  Overdue for Gyn exam.  C/O Depression and stress since the Covid-19 pandemic started in 08/2018.  Worried about her hair loss.  Decreased appetite.  Feels low mood, lost hope in the future.  No suicidal ideation.  Not sexually active because of vaginal dryness and pain with IC.  Occasional hot flushes/night sweats.  Walks occasionally in her neighborhood with her husband.  Past medical history,surgical history, family history and social history were all reviewed and documented in the EPIC chart.  Gynecologic History No LMP recorded. Patient has had a hysterectomy.  Obstetric History OB History  Gravida Para Term Preterm AB Living  0 0 0 0 0 0  SAB TAB Ectopic Multiple Live Births  0 0 0 0 0     ROS: A ROS was performed and pertinent positives and negatives are included in the history.  GENERAL: No fevers or chills. HEENT: No change in vision, no earache, sore throat or sinus congestion. NECK: No pain or stiffness. CARDIOVASCULAR: No chest pain or pressure. No palpitations. PULMONARY: No shortness of breath, cough or wheeze. GASTROINTESTINAL: No abdominal pain, nausea, vomiting or diarrhea, melena or bright red blood per rectum. GENITOURINARY: No urinary frequency, urgency, hesitancy or dysuria. MUSCULOSKELETAL: No joint or muscle pain, no back pain, no recent trauma. DERMATOLOGIC: No rash, no itching, no lesions. ENDOCRINE: No polyuria, polydipsia, no heat or cold intolerance. No recent change in weight. HEMATOLOGICAL: No anemia or easy bruising or bleeding. NEUROLOGIC: No headache, seizures, numbness, tingling or weakness. PSYCHIATRIC: No depression, no loss of interest in normal activity or change in sleep pattern.     Exam:   BP (!)  150/90   Ht 5' (1.524 m)   Wt 139 lb (63 kg)   BMI 27.15 kg/m   Body mass index is 27.15 kg/m.  General appearance : Well developed well nourished female. No acute distress  Pelvic:  Deferred   Assessment/Plan:  73 y.o. female for annual exam   1. Moderate episode of recurrent major depressive disorder (HCC) Moderate recurrent major depression without suicidal ideation.  Decision to start on Lexapro 10 mg/tab 2 tablets p.o. daily.  Usage reviewed as well as risks and benefits.  Prescription sent to pharmacy.  Encouraged to continue with regular physical activities to bring endorphins and stay in good fitness.  2. Post-menopausal atrophic vaginitis Recommend coconut oil.  Usage reviewed.  If not sufficient, will prescribe a local estrogen cream or tablet.  Other orders - escitalopram (LEXAPRO) 10 MG tablet; Take 2 tablets (20 mg total) by mouth daily.  Counseling on above issues and coordination of care more than 50% for 25 minutes.  Princess Bruins MD, 10:57 AM 01/21/2019

## 2019-01-22 ENCOUNTER — Telehealth: Payer: Self-pay | Admitting: *Deleted

## 2019-01-22 DIAGNOSIS — H93A1 Pulsatile tinnitus, right ear: Secondary | ICD-10-CM | POA: Insufficient documentation

## 2019-01-22 DIAGNOSIS — R490 Dysphonia: Secondary | ICD-10-CM | POA: Insufficient documentation

## 2019-01-22 NOTE — Telephone Encounter (Signed)
Left message for patient to call to give her name for therapist for Lurena Nida [phone] and Velora Heckler [phone].

## 2019-01-22 NOTE — Telephone Encounter (Signed)
Patient informed with below note, will call to schedule.

## 2019-01-22 NOTE — Telephone Encounter (Signed)
-----   Message from Princess Bruins, MD sent at 01/21/2019 11:25 AM EDT ----- Regarding: Help patient find a Psychotherapist Major depression.  Wants a psychotherapist with experience, older.Marland KitchenMarland Kitchen

## 2019-01-26 ENCOUNTER — Encounter: Payer: Self-pay | Admitting: Obstetrics & Gynecology

## 2019-01-26 NOTE — Patient Instructions (Signed)
1. Moderate episode of recurrent major depressive disorder (HCC) Moderate recurrent major depression without suicidal ideation.  Decision to start on Lexapro 10 mg/tab 2 tablets p.o. daily.  Usage reviewed as well as risks and benefits.  Prescription sent to pharmacy.  Encouraged to continue with regular physical activities to bring endorphins and stay in good fitness.  2. Post-menopausal atrophic vaginitis Recommend coconut oil.  Usage reviewed.  If not sufficient, will prescribe a local estrogen cream or tablet.  Other orders - escitalopram (LEXAPRO) 10 MG tablet; Take 2 tablets (20 mg total) by mouth daily.  Follow-up annual gynecologic exam.  [name], it was a pleasure seeing you today!

## 2019-03-14 ENCOUNTER — Encounter: Payer: Medicare Other | Admitting: Obstetrics & Gynecology

## 2019-03-26 ENCOUNTER — Encounter: Payer: Medicare Other | Admitting: Obstetrics & Gynecology

## 2019-07-17 DIAGNOSIS — L650 Telogen effluvium: Secondary | ICD-10-CM | POA: Insufficient documentation

## 2019-07-17 DIAGNOSIS — Z5181 Encounter for therapeutic drug level monitoring: Secondary | ICD-10-CM | POA: Insufficient documentation

## 2019-09-05 DIAGNOSIS — J392 Other diseases of pharynx: Secondary | ICD-10-CM | POA: Insufficient documentation

## 2019-12-09 ENCOUNTER — Ambulatory Visit (INDEPENDENT_AMBULATORY_CARE_PROVIDER_SITE_OTHER): Payer: Medicare Other | Admitting: Diagnostic Neuroimaging

## 2019-12-09 ENCOUNTER — Encounter: Payer: Self-pay | Admitting: Diagnostic Neuroimaging

## 2019-12-09 VITALS — BP 162/85 | HR 78 | Ht 60.0 in | Wt 140.0 lb

## 2019-12-09 DIAGNOSIS — G459 Transient cerebral ischemic attack, unspecified: Secondary | ICD-10-CM | POA: Diagnosis not present

## 2019-12-09 DIAGNOSIS — R251 Tremor, unspecified: Secondary | ICD-10-CM | POA: Diagnosis not present

## 2019-12-09 NOTE — Progress Notes (Signed)
[facility]  PATIENT: [name] DOB: November 28, 1945  REFERRING CLINICIAN:  HISTORY FROM: patient and husband  REASON FOR VISIT: follow up   HISTORICAL  CHIEF COMPLAINT:  Chief Complaint  Patient presents with  . Tremors    rm 6 New Pt, husband- [name] "generalized tremors wake me up, more tremors in right hand; feel like I'm going thru menopause again with body temperature changes; feel like my body is shaking inside when I walk"    HISTORY OF PRESENT ILLNESS:   UPDATE (12/09/19, VRP): Since last 6 to 12 months patient is under increased stress and tension, anxiety, depression, related to Covid pandemic, political events, collections, and other factors.  She finds her self increasingly concerned and worried about the future.  She has developed intermittent shaking and tremor in her arms and hands.  Sometimes she wakes up in the middle the night with internal sensation of shaking lasting for 30 seconds at a time.  Patient also having issues with her neck, arms, legs.  She continues to be reluctant to take any medications, seeing psychiatry or psychology, take blood pressure medicines or other treatments.  She is hopeful to use "natural" treatments.  She is also worried about her intracranial atherosclerosis on prior imaging.  She is worried about risk for stroke.  Yet she still is reluctant to take vascular risk factor reduction medications.  UPDATE 07/20/15: Since last visit, was in the hospital for visual disturbance (colors, zig-zags, blurred vision) x 20 minutes. No headache. Admitted in 06/11/15 to hospital. 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 No recurrent symptoms. Reluctant to start BP med and statin.   PRIOR HPI (01/13/15): 74 year old right-handed female here for evaluation of dizziness, double vision, hearing loss, ringing in ears. 11/12/2014 patient had pressure and fullness sensation in her left ear. Within a couple of days she developed significant vertigo  attack with nausea, vomiting, balance difficulty. She had decreased hearing in the left ear. She is at intermittent double vision and tilted vision. Patient went to ENT locally as well as at [facility] for evaluation. She was diagnosed with Mnire's disease and prescribed Valium and diuretic which she took for a few days and then stopped. Patient was also recommended to have MRI of the brain, however she canceled this due to concern about this possibly damaging her hearing further. Patient also went to ophthalmologist for evaluation of double vision. No specific eye related pathology was found. Patient was then referred to me for further evaluation. No prodromal trauma, infections, triggering factors. Patient has a history of TIA in the past. She also has history of cervical spinal stenosis. She's had some milder balance problems as far back as 2014 and previously saw my colleague Dr. Terrace Arabia.   REVIEW OF SYSTEMS: Full 14 system review of systems performed and notable only for hearing loss ringing in ears blurred vision joint pain aching muscles muscle cramps temp intolerance.    ALLERGIES: Allergies  Allergen Reactions  . Epinephrine   . Contrast Media [Iodinated Diagnostic Agents] Itching    Pt had itchy nose and dry throat and was told she was allergic to IV contrast and does not want to have it.  Marland Kitchen Keflex [Cephalexin] Swelling    HOME MEDICATIONS: Outpatient Medications Prior to Visit  Medication Sig Dispense Refill  . aspirin EC 81 MG EC tablet Take 1 tablet (81 mg total) by mouth daily. 30 tablet 1  . Cholecalciferol (VITAMIN D3) 2000 UNITS TABS Take 2,000 Units by mouth daily.    Marland Kitchen  diazepam (VALIUM) 5 MG tablet Take 5 mg by mouth every 8 (eight) hours as needed (dizziness).    . meclizine (ANTIVERT) 12.5 MG tablet Take 1 tablet (12.5 mg total) by mouth 3 (three) times daily as needed for dizziness. 15 tablet 0  . ondansetron (ZOFRAN) 4 MG tablet TAKE 1 TABLET (4 MG TOTAL) BY MOUTH EVERY 8 (EIGHT)  HOURS AS NEEDED FOR NAUSEA.  0  . escitalopram (LEXAPRO) 10 MG tablet Take 2 tablets (20 mg total) by mouth daily. 30 tablet 5   No facility-administered medications prior to visit.    PAST MEDICAL HISTORY: Past Medical History:  Diagnosis Date  . Anxiety   . Arthritis   . Balance problem   . Depression   . Double vision 10/2014   bilateral  . Dyslipidemia   . High blood pressure   . Multiple thyroid nodules   . Paroxysmal SVT (supraventricular tachycardia) ([place])   . PVC (premature ventricular contraction)    intermittent  . Syncope and collapse    last friday had alot of dizziness    PAST SURGICAL HISTORY: Past Surgical History:  Procedure Laterality Date  . ABDOMINAL HYSTERECTOMY    . catheter ablation    . TONSILLECTOMY      FAMILY HISTORY: Family History  Problem Relation Age of Onset  . High blood pressure Father   . Stroke Father 63  . CAD Father 23       CABG.  Lived to be 85  . Arthritis Mother   . Neuropathy Mother     SOCIAL HISTORY:  Social History   Socioeconomic History  . Marital status: Married    Spouse name: Antony Haste  . Number of children: 0  . Years of education: 47  . Highest education level: Not on file  Occupational History    Employer: RETIRED    Comment: retired  Tobacco Use  . Smoking status: Former Smoker    Packs/day: 0.00    Years: 20.00    Pack years: 0.00    Types: Cigarettes    Quit date: 12/11/1984    Years since quitting: 35.0  . Smokeless tobacco: Never Used  . Tobacco comment: Quit 40 years ago  Vaping Use  . Vaping Use: Never used  Substance and Sexual Activity  . Alcohol use: No  . Drug use: No  . Sexual activity: Yes    Partners: Male    Comment: married- 25 yrs   Other Topics Concern  . Not on file  Social History Narrative   Patient lives at home with her husband Antony Haste). Patient is retired.    Social Determinants of Health   Financial Resource Strain:   . Difficulty of Paying Living Expenses:   Food  Insecurity:   . Worried About Charity fundraiser in the Last Year:   . Arboriculturist in the Last Year:   Transportation Needs:   . Film/video editor (Medical):   Marland Kitchen Lack of Transportation (Non-Medical):   Physical Activity:   . Days of Exercise per Week:   . Minutes of Exercise per Session:   Stress:   . Feeling of Stress :   Social Connections:   . Frequency of Communication with Friends and Family:   . Frequency of Social Gatherings with Friends and Family:   . Attends Religious Services:   . Active Member of Clubs or Organizations:   . Attends Archivist Meetings:   Marland Kitchen Marital Status:   Intimate Production manager  Violence:   . Fear of Current or Ex-Partner:   . Emotionally Abused:   Marland Kitchen Physically Abused:   . Sexually Abused:      PHYSICAL EXAM  GENERAL EXAM/CONSTITUTIONAL: Vitals:  Vitals:   12/09/19 1338  BP: (!) 162/85  Pulse: 78  Weight: 140 lb (63.5 kg)  Height: 5' (1.524 m)   Body mass index is 27.34 kg/m. No exam data present  Patient is in no distress; well developed, nourished and groomed; neck is supple  CARDIOVASCULAR:  Examination of carotid arteries is normal; no carotid bruits  Regular rate and rhythm, no murmurs  Examination of peripheral vascular system by observation and palpation is normal  EYES:  Ophthalmoscopic exam of optic discs and posterior segments is normal; no papilledema or hemorrhages  MUSCULOSKELETAL:  Gait, strength, tone, movements noted in Neurologic exam below  NEUROLOGIC: MENTAL STATUS:  No flowsheet data found.  awake, alert, oriented to person, place and time  recent and remote memory intact  normal attention and concentration  language fluent, comprehension intact, naming intact,   fund of knowledge appropriate  CRANIAL NERVE:   2nd - no papilledema on fundoscopic exam  2nd, 3rd, 4th, 6th - pupils equal and reactive to light, visual fields full to confrontation, extraocular muscles intact, no  nystagmus  5th - facial sensation symmetric  7th - facial strength symmetric  8th - hearing intact to whispher  9th - palate elevates symmetrically, uvula midline  11th - shoulder shrug symmetric  12th - tongue protrusion midline  MOTOR:   normal bulk and tone, full strength in the BUE, BLE  SENSORY:   normal and symmetric to light touch, temperature, vibration  COORDINATION:   finger-nose-finger, fine finger movements normal  REFLEXES:   deep tendon reflexes TRACE AT BUE AND KNEES; ABSENT AT ANKLES  GAIT/STATION:   narrow based gait; able to walk on toes, heels; SLIGHT DIFF WITH TANDEM; romberg is negative    DIAGNOSTIC DATA (LABS, IMAGING, TESTING) - I reviewed patient records, labs, notes, testing and imaging myself where available.  Lab Results  Component Value Date   WBC 6.5 06/12/2015   HGB 13.3 06/12/2015   HCT 39.3 06/12/2015   MCV 90.8 06/12/2015   PLT 233 06/12/2015      Component Value Date/Time   NA 141 06/12/2015 0525   K 3.8 06/12/2015 0525   CL 105 06/12/2015 0525   CO2 28 06/12/2015 0525   GLUCOSE 99 06/12/2015 0525   BUN 15 06/12/2015 0525   CREATININE 0.98 06/12/2015 0525   CREATININE 0.79 09/29/2014 0959   CALCIUM 9.6 06/12/2015 0525   PROT 6.4 (L) 06/12/2015 0525   ALBUMIN 3.7 06/12/2015 0525   AST 20 06/12/2015 0525   ALT 19 06/12/2015 0525   ALKPHOS 61 06/12/2015 0525   BILITOT 0.7 06/12/2015 0525   GFRNONAA 57 (L) 06/12/2015 0525   GFRAA >60 06/12/2015 0525   Lab Results  Component Value Date   CHOL 257 (H) 06/12/2015   HDL 50 06/12/2015   LDLCALC 179 (H) 06/12/2015   TRIG 139 06/12/2015   CHOLHDL 5.1 06/12/2015   Lab Results  Component Value Date   HGBA1C 6.4 (H) 06/12/2015   Lab Results  Component Value Date   VITAMINB12 522 04/07/2011   Lab Results  Component Value Date   TSH 2.858 09/29/2014    11/18/14 CT head [I reviewed images myself and agree with interpretation. -VRP]  1. No acute findings. 2.  Chronic small vessel disease, stable from brain  MRI March 2015.  09/12/14 MRI brain [I reviewed images myself and agree with interpretation. -VRP]  1. Mild scattered periventricular and subcortical foci of T2 hyperintensities. These findings are non-specific and considerations include autoimmune, inflammatory, post-infectious, microvascular ischemic or migraine associated etiologies..  2. No significant change from MRI on 04/06/11.  06/12/15 MRI brain / MRA head [I reviewed images myself and agree with interpretation. -VRP]   1. Incomplete brain MRI as above. No acute infarct. 2. Mild chronic small vessel ischemic disease. 3. New/increased, moderate right P2 PCA stenosis. 4. Moderate to severe irregular narrowing throughout the right MCA branch vessels, similar to prior. 5. Mild stenoses of the proximal cavernous right ICA and proximal right ACA.  06/12/15 carotid u/s  - Bilateral - 1% to 39% ICA stenosis. Vertebral arery flow is antegrade.  06/12/15 TTE  - Left ventricle: The cavity size was normal. Wall thickness was normal. Systolic function was vigorous. The estimated ejection fraction was in the range of 65% to 70%. Doppler parameters are consistent with abnormal left ventricular relaxation (grade 1 diastolic dysfunction). - Aortic valve: There was trivial regurgitation.    ASSESSMENT AND PLAN  74 y.o. year old female here with intermittent episodes of vertigo, nausea, vomiting, hearing loss, tinnitus, most likely represents Mnire's disease. Now with transient visual disturbance, possibly right occipital TIA vs ocular migraine. Needs aggressive risk factor control.  Now with increasing anxiety depression since 2020 with intermittent tremor, likely related to the same.  Dx:  Tremor  TIA (transient ischemic attack)    PLAN:  TREMORS / ANXIETY / DEPRESSION - check MRI brain (rule out other causes) - likely anxiety associated symptoms; follow up with  PCP  STROKE PREVENTION - start aspirin 81mg  daily - consider statin - consider BP meds  - consider sleep study (h/o OSA on CPAP > 5 years ago)  Orders Placed This Encounter  Procedures  . MR BRAIN WO CONTRAST   Return in about 6 months (around 06/09/2020) for with NP (Amy Lomax).    06/11/2020, MD 12/09/2019, 2:23 PM Certified in Neurology, Neurophysiology and Neuroimaging  [facility] [address], [address] [place], [place] [phone]

## 2019-12-09 NOTE — Patient Instructions (Signed)
TREMORS / ANXIETY / DEPRESSION - check MRI brain  - likely anxiety associated symptoms; follow up with PCP  STROKE PREVENTION - continue aspirin 81mg  daily - consider statin - consider BP meds  - consider sleep study (h/o OSA on CPAP > 5 years ago)

## 2019-12-10 ENCOUNTER — Telehealth: Payer: Self-pay | Admitting: Diagnostic Neuroimaging

## 2019-12-10 NOTE — Telephone Encounter (Signed)
Medicare/tricare order sent to GI. No auth they will reach out to the patient to schedule.  

## 2019-12-16 ENCOUNTER — Institutional Professional Consult (permissible substitution): Payer: Medicare Other | Admitting: Diagnostic Neuroimaging

## 2019-12-19 ENCOUNTER — Other Ambulatory Visit: Payer: Self-pay

## 2019-12-19 ENCOUNTER — Ambulatory Visit
Admission: RE | Admit: 2019-12-19 | Discharge: 2019-12-19 | Disposition: A | Discharge status: Home / Self Care | Payer: Medicare Other | Source: Ambulatory Visit | Attending: Diagnostic Neuroimaging | Admitting: Diagnostic Neuroimaging

## 2019-12-19 DIAGNOSIS — R251 Tremor, unspecified: Secondary | ICD-10-CM | POA: Diagnosis not present

## 2019-12-23 ENCOUNTER — Telehealth: Payer: Self-pay | Admitting: *Deleted

## 2019-12-23 NOTE — Telephone Encounter (Signed)
Small vessel stroke; not sure of exact timing. Small risk of recurrence. recommend aspirin, statin, BP meds and CPAP to reduce future stroke risk. Not related to meniere's. Do not recommend MRA at this time. -VRP

## 2019-12-23 NOTE — Telephone Encounter (Signed)
Received my chart: I just received the results of my MRI and see that I have a new diagnosis. 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  Does this mean I had a stroke? How serious is this and is it impending that I will have another? Could it be related to my meineres that I also had on the left side and do you think I should have an MRA etc.. would you please give me an indication when this might have happened? Routed to MD.

## 2019-12-24 NOTE — Telephone Encounter (Signed)
Spoke with patient at length about results of MRI brain, answered all her questions to her stated satisfaction. She is taking ASA 81 mg daily. Advised she FU closely with PCP re: BP management, lipids management, eat healthy, exercise to reduce her risk of future strokes. Patient verbalized understanding, appreciation.

## 2020-02-11 IMAGING — US US THYROID
1 series · 13 of 25 positions shown · non-contrast
Comparison: Prior thyroid ultrasound 10/18/2010

CLINICAL DATA: Goiter. 72-year-old female with a history of thyroid
nodules.

EXAM:
THYROID ULTRASOUND
TECHNIQUE: Ultrasound examination of the thyroid gland and adjacent soft
tissues was performed.

[Series 1: us thyroid · 0.05mm/px · 13 of 49 slices shown]
[im 1/49]
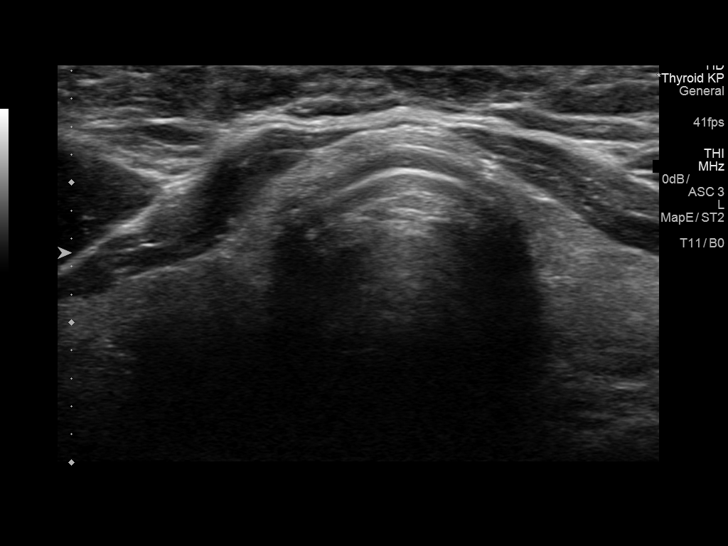
[im 5/49]
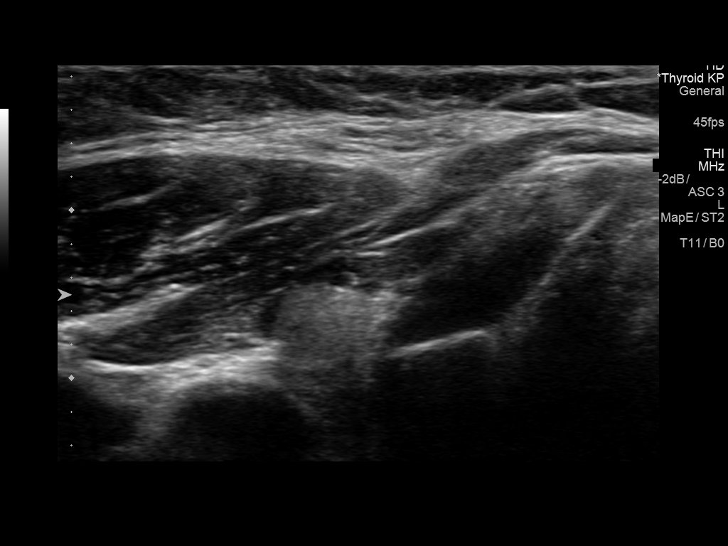
[im 9/49]
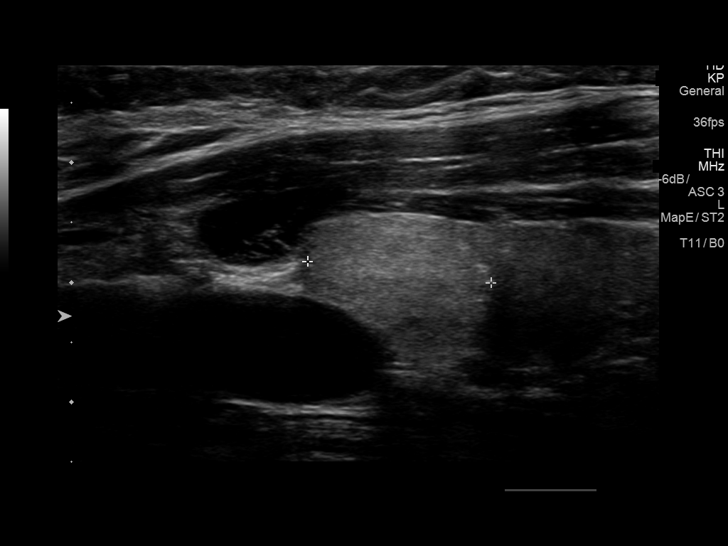
[im 13/49]
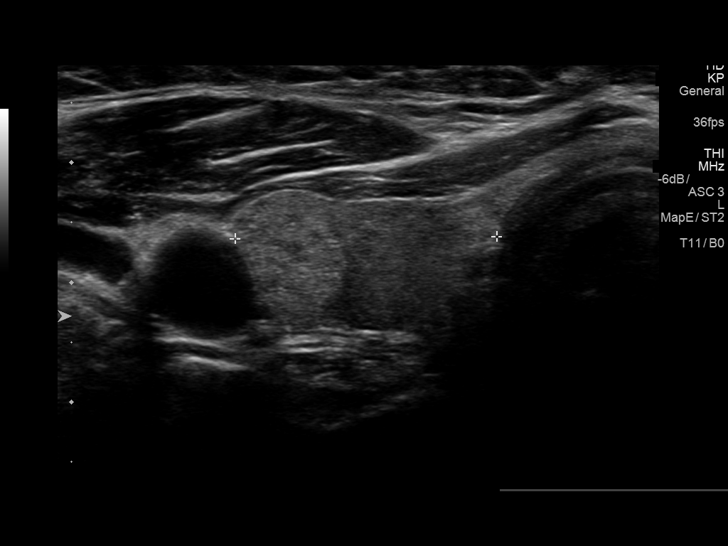
[im 17/49]
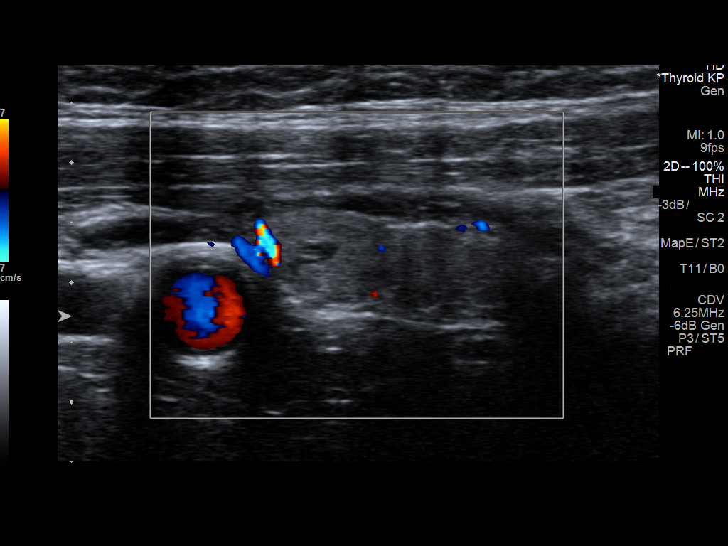
[im 21/49]
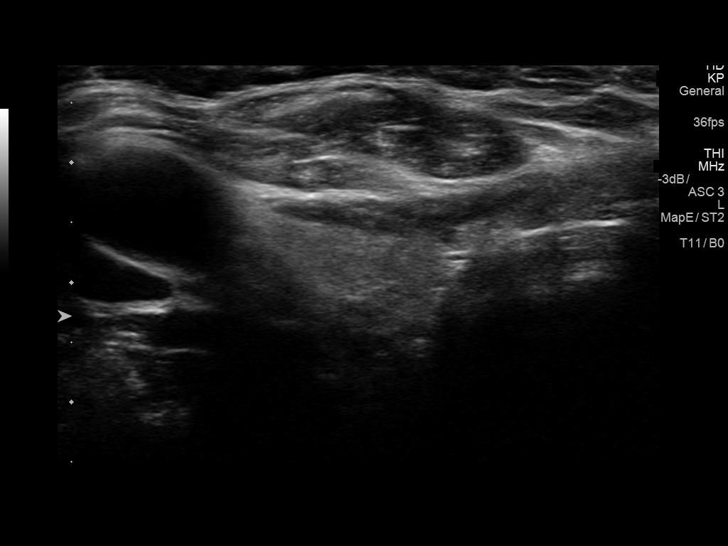
[im 25/49]
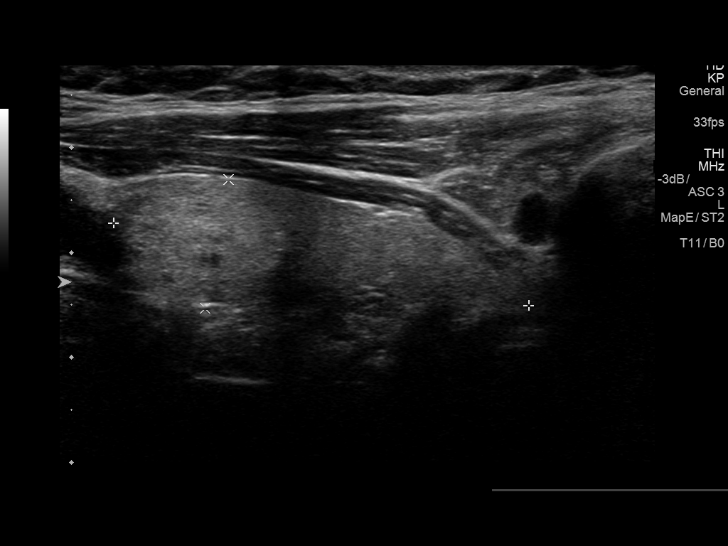
[im 29/49]
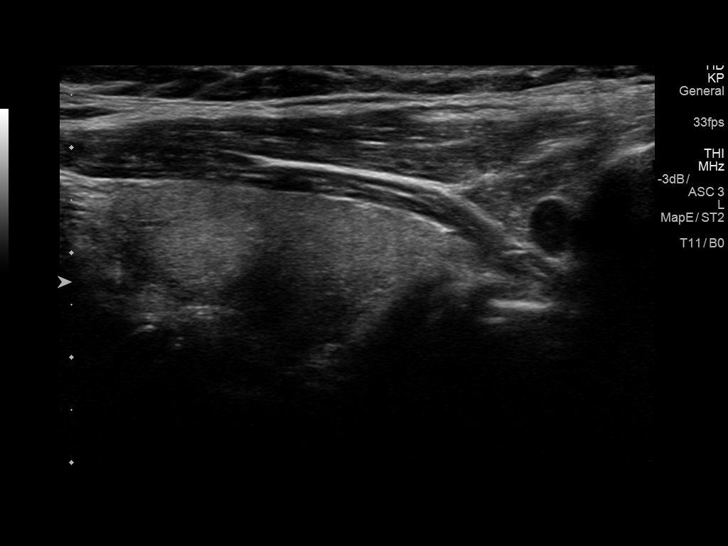
[im 33/49]
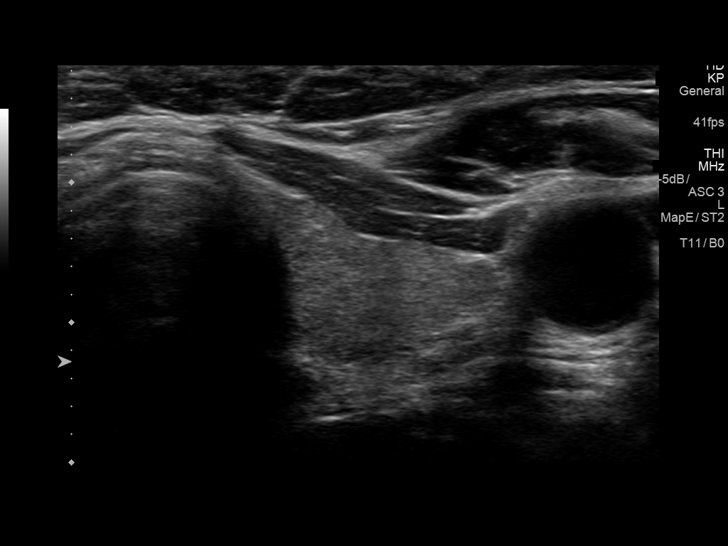
[im 37/49]
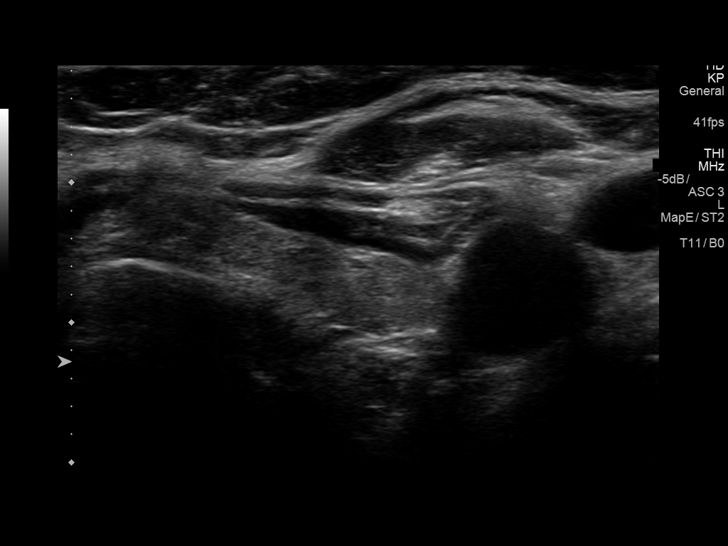
[im 41/49]
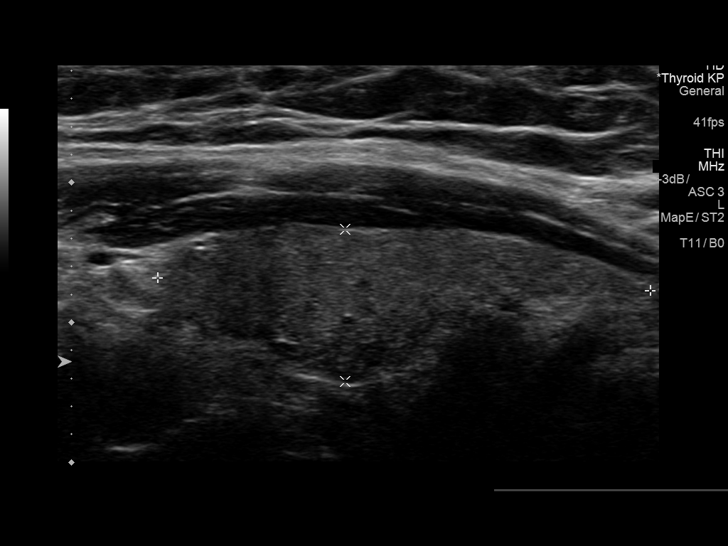
[im 45/49]
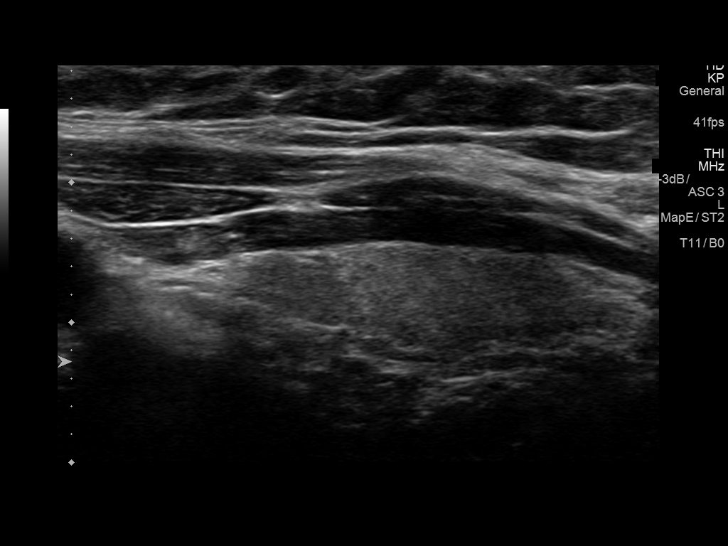
[im 49/49]
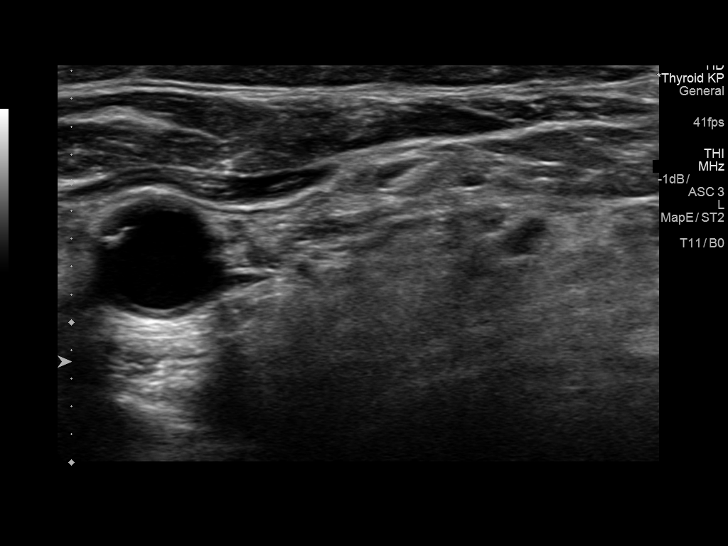

[13 of 25 positions shown; findings below may reference images not displayed]

FINDINGS: Parenchymal Echotexture: Normal

Isthmus: 0.2 cm

Right lobe: 4.0 x 1.3 x 2.2 cm

Left lobe: 3.5 x 1.1 x 1.6 cm

_________________________________________________________

Estimated total number of nodules >/= 1 cm: 1

Number of spongiform nodules >/=  2 cm not described below (TR1): 0

Number of mixed cystic and solid nodules >/= 1.5 cm not described
below (TR2): 0

_________________________________________________________

A subtle echogenic solid nodule is present in the right upper gland.
Overall, this nodule remains essentially unchanged for more than 7
years presently measuring a maximum of 1.5 x 1.1 x 1.2 cm compared
to 1.2 x 0.7 x 0.8 cm in 6146. An additional subcentimeter isoechoic
solid nodule in the right mid gland is noted incidentally. This
lesion does not meet criteria for further evaluation.
IMPRESSION: Minimal change in the size and appearance of the right superior
thyroid nodule dating back to Sunday September, 2010. Greater than 7 year
stability is consistent with benignity. No further follow-up
required.

The above is in keeping with the ACR TI-RADS recommendations - [HOSPITAL] 3365;[DATE].

## 2020-02-11 IMAGING — US US CAROTID DUPLEX BILAT
1 series · 13 of 24 positions shown · non-contrast
Comparison: None.

CLINICAL DATA: Pulsatile tinnitus

EXAM:
BILATERAL CAROTID DUPLEX ULTRASOUND
TECHNIQUE: Gray scale imaging, color Doppler and duplex ultrasound were
performed of bilateral carotid and vertebral arteries in the neck.

[Series 1: us carotid duplex bilat · 0.06mm/px · 13 of 46 slices shown]
[im 1/46]
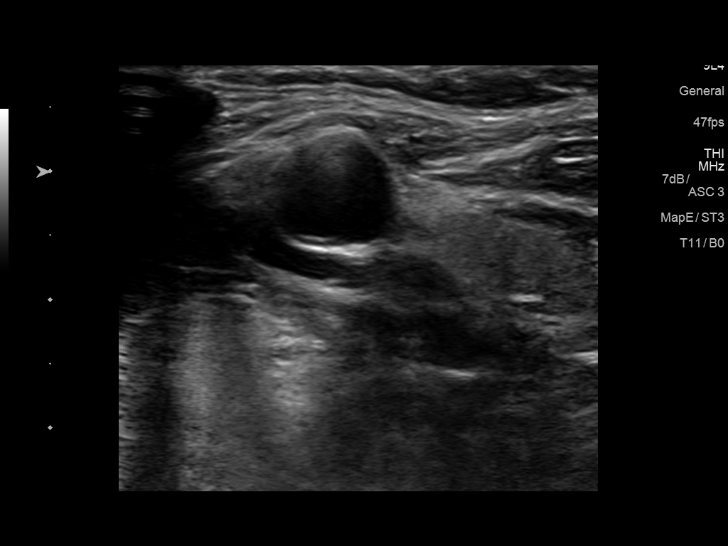
[im 4/46]
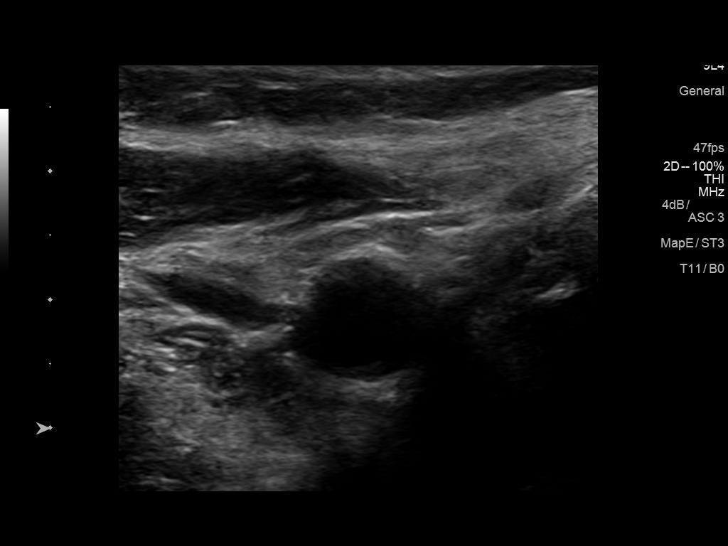
[im 8/46]
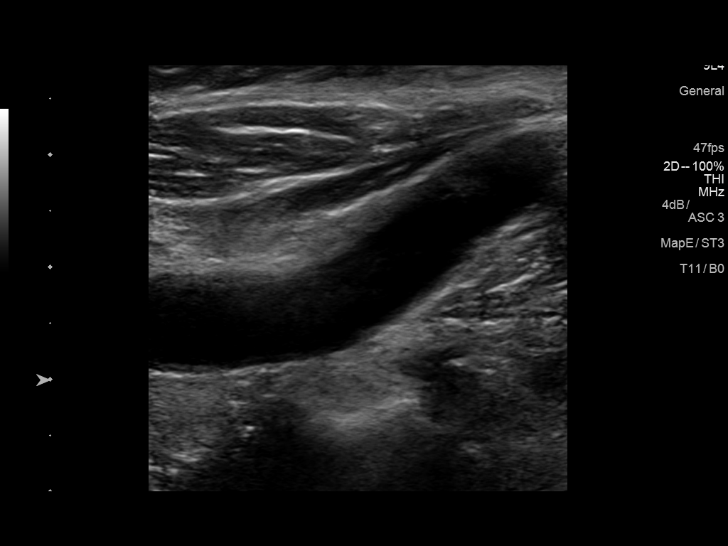
[im 12/46]
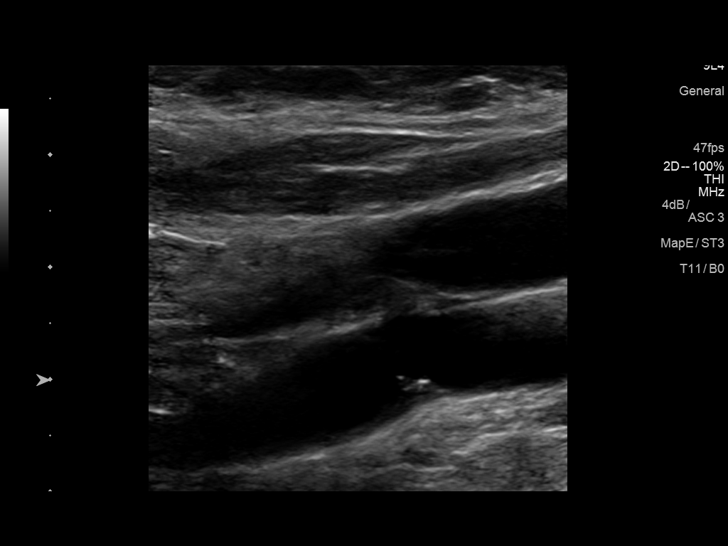
[im 16/46]
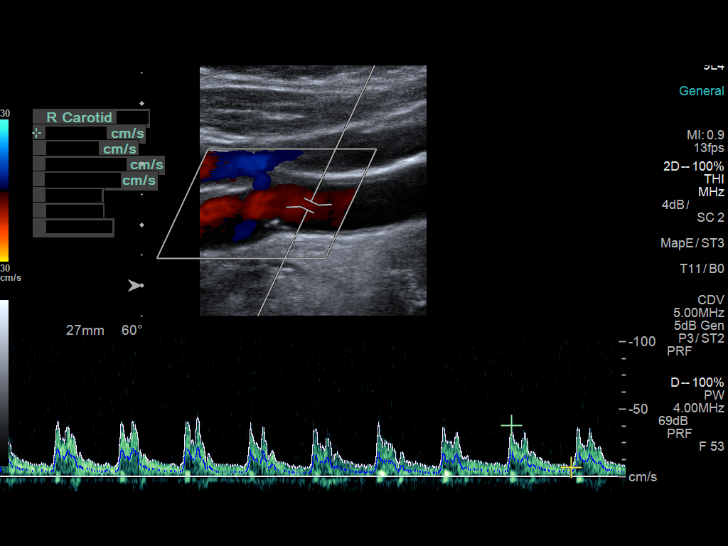
[im 20/46]
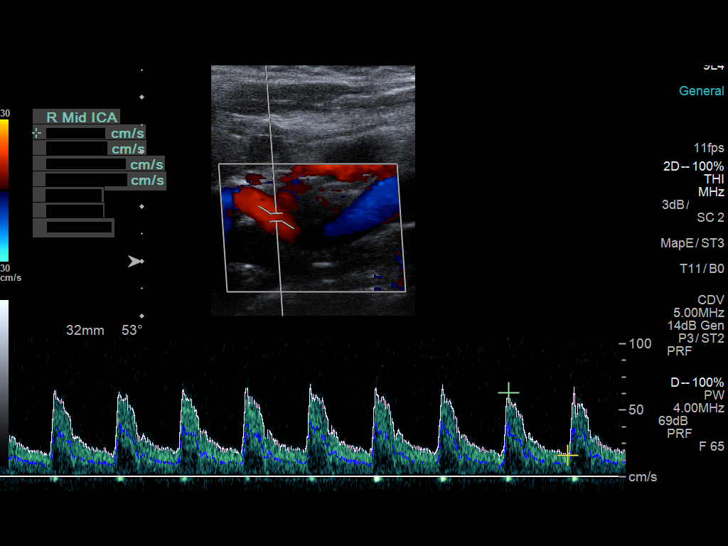
[im 24/46]
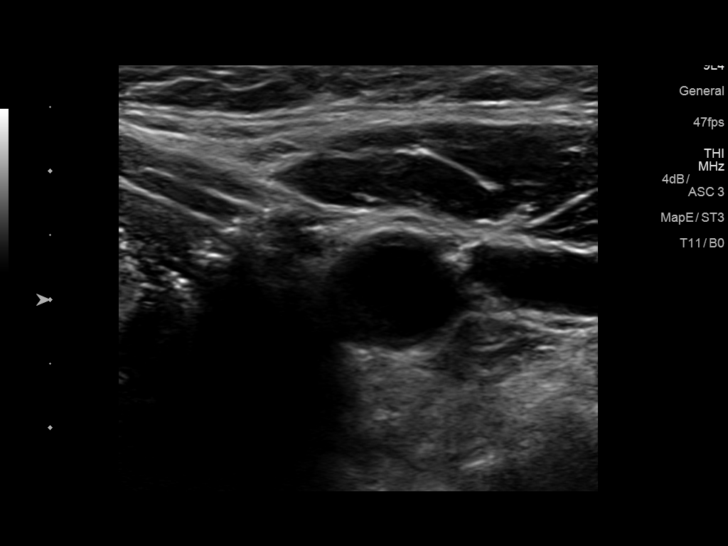
[im 26/46]
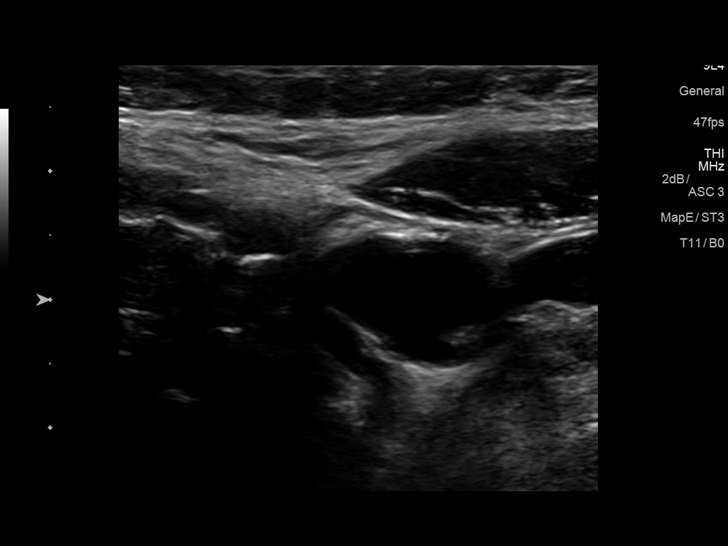
[im 30/46]
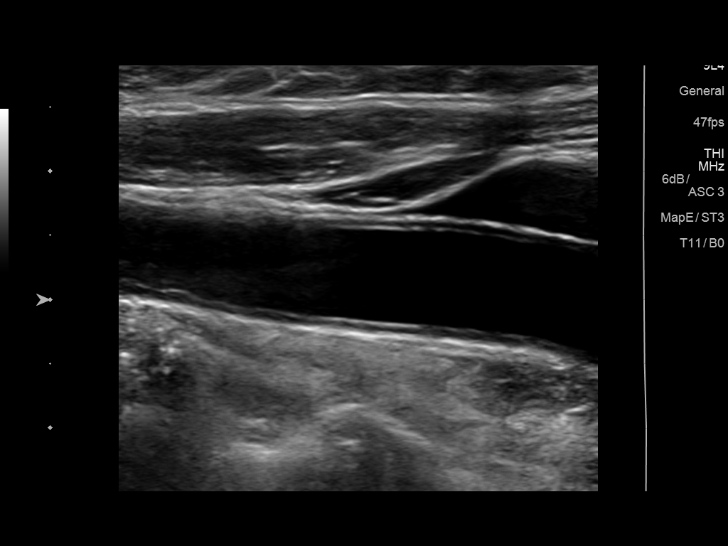
[im 34/46]
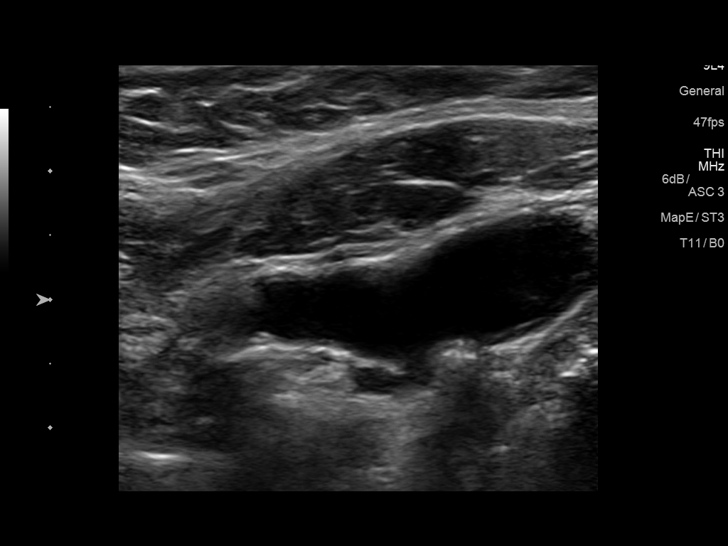
[im 38/46]
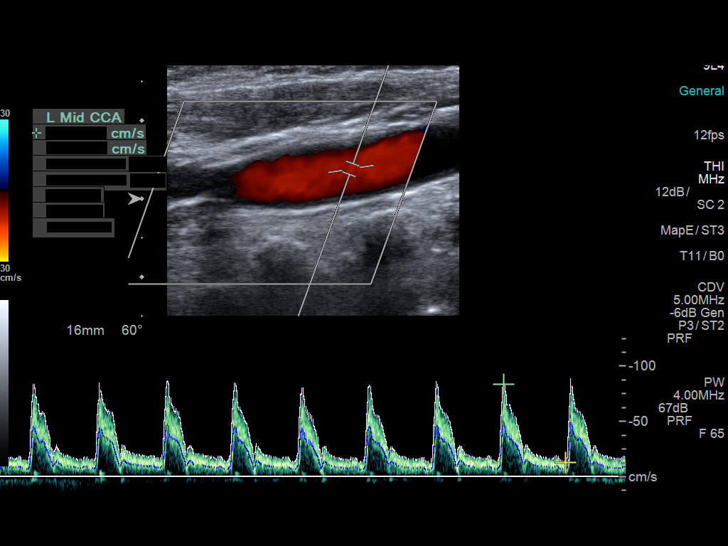
[im 42/46]
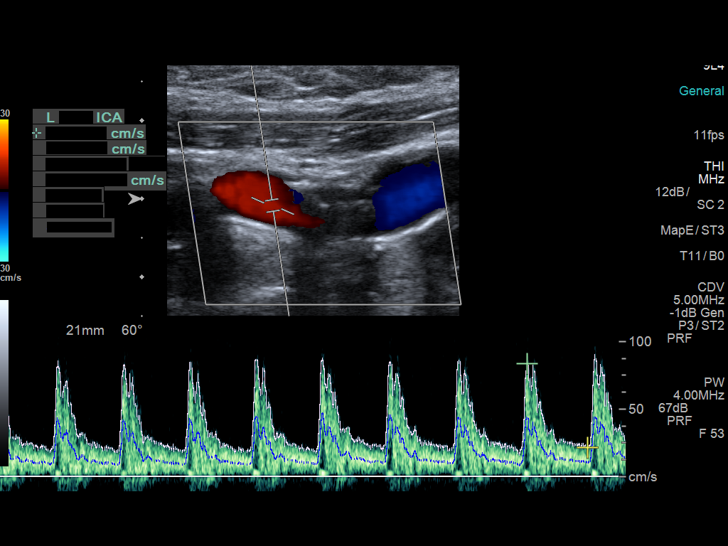
[im 46/46]
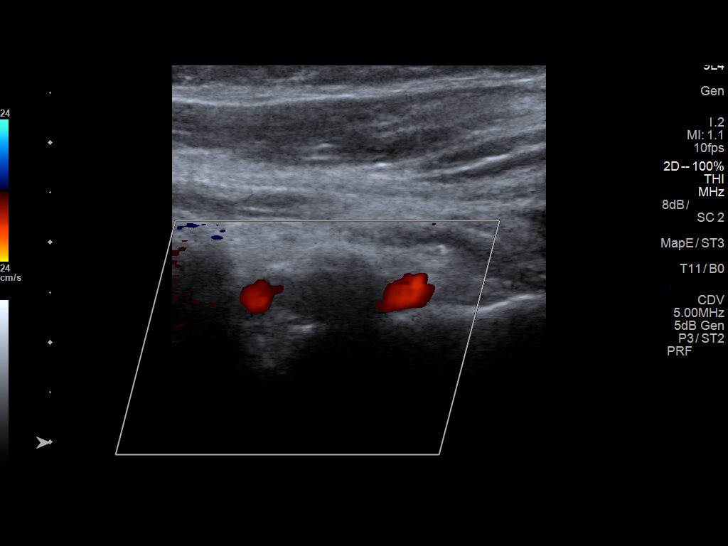

[13 of 24 positions shown; findings below may reference images not displayed]

FINDINGS: Criteria: Quantification of carotid stenosis is based on velocity
parameters that correlate the residual internal carotid diameter
with NASCET-based stenosis levels, using the diameter of the distal
internal carotid lumen as the denominator for stenosis measurement.

The following velocity measurements were obtained:

RIGHT

ICA:  83 cm/sec

CCA:  120 cm/sec

SYSTOLIC ICA/CCA RATIO:

DIASTOLIC ICA/CCA RATIO:

ECA:  150 cm/sec

LEFT

ICA:  107 cm/sec

CCA:  88 cm/sec

SYSTOLIC ICA/CCA RATIO:

DIASTOLIC ICA/CCA RATIO:

ECA:  76 cm/sec

RIGHT CAROTID ARTERY: Mild focal smooth mixed plaque in the bulb.
Low resistance internal carotid Doppler pattern is preserved.

RIGHT VERTEBRAL ARTERY:  Antegrade.

LEFT CAROTID ARTERY: Moderate irregular mixed plaque in the bulb.
Low resistance internal carotid Doppler pattern is preserved.

LEFT VERTEBRAL ARTERY:  Antegrade.
IMPRESSION: Less than 50% stenosis in the right and left internal carotid
arteries.

## 2020-06-08 ENCOUNTER — Ambulatory Visit: Payer: Medicare Other | Admitting: Diagnostic Neuroimaging

## 2020-08-19 DIAGNOSIS — K582 Mixed irritable bowel syndrome: Secondary | ICD-10-CM | POA: Insufficient documentation

## 2020-08-19 DIAGNOSIS — G8929 Other chronic pain: Secondary | ICD-10-CM | POA: Insufficient documentation

## 2020-08-28 DIAGNOSIS — R768 Other specified abnormal immunological findings in serum: Secondary | ICD-10-CM | POA: Insufficient documentation

## 2020-08-28 DIAGNOSIS — R7989 Other specified abnormal findings of blood chemistry: Secondary | ICD-10-CM | POA: Insufficient documentation

## 2020-08-28 DIAGNOSIS — Z86010 Personal history of colonic polyps: Secondary | ICD-10-CM | POA: Insufficient documentation

## 2020-10-03 ENCOUNTER — Encounter (HOSPITAL_BASED_OUTPATIENT_CLINIC_OR_DEPARTMENT_OTHER): Payer: Self-pay | Admitting: Emergency Medicine

## 2020-10-03 ENCOUNTER — Emergency Department (HOSPITAL_BASED_OUTPATIENT_CLINIC_OR_DEPARTMENT_OTHER)
Admission: EM | Admit: 2020-10-03 | Discharge: 2020-10-03 | Disposition: A | Discharge status: Home / Self Care | Payer: Medicare Other | Attending: Emergency Medicine | Admitting: Emergency Medicine

## 2020-10-03 DIAGNOSIS — R030 Elevated blood-pressure reading, without diagnosis of hypertension: Secondary | ICD-10-CM

## 2020-10-03 DIAGNOSIS — Z87891 Personal history of nicotine dependence: Secondary | ICD-10-CM | POA: Insufficient documentation

## 2020-10-03 DIAGNOSIS — F419 Anxiety disorder, unspecified: Secondary | ICD-10-CM | POA: Diagnosis not present

## 2020-10-03 DIAGNOSIS — Z7982 Long term (current) use of aspirin: Secondary | ICD-10-CM | POA: Insufficient documentation

## 2020-10-03 DIAGNOSIS — F32A Depression, unspecified: Secondary | ICD-10-CM | POA: Insufficient documentation

## 2020-10-03 DIAGNOSIS — R6889 Other general symptoms and signs: Secondary | ICD-10-CM

## 2020-10-03 DIAGNOSIS — I10 Essential (primary) hypertension: Secondary | ICD-10-CM | POA: Diagnosis not present

## 2020-10-03 DIAGNOSIS — F321 Major depressive disorder, single episode, moderate: Secondary | ICD-10-CM

## 2020-10-03 MED ORDER — LORAZEPAM 1 MG PO TABS
0.5000 mg | ORAL_TABLET | Freq: Once | ORAL | Status: AC
  Administered 2020-10-03: 0.5 mg via ORAL
  Filled 2020-10-03: qty 1

## 2020-10-03 MED ORDER — AMLODIPINE BESYLATE 2.5 MG PO TABS: 2.5000 mg | ORAL_TABLET | Freq: Every day | ORAL | 0 refills | Status: DC

## 2020-10-03 NOTE — ED Notes (Signed)
She has completed her T.T.S. eval. Her usband remains with her.

## 2020-10-03 NOTE — Discharge Instructions (Addendum)
It was our pleasure to provide your ER care today - we hope that you feel better.  Use resources below for outpatient therapy and counseling.  Discuss possibly starting medication to help with your symptoms then. Also try mindfulness activities to help with stress/relaxation. Your blood pressure is high today - limit salt intake, take medication as prescribed, and follow up with primary care doctor in 1-2 weeks.   Return to ER if worse, new symptoms, fevers, new or severe pain, persistent vomiting, trouble breathing, or other concern.   Outpatient Therapy is recommended:  The following are clinics that offer therapy, psychological testing and medication management(should medication be recommended going forward).  [facility] outpatient [address] [place], [place] [address] Phone: [phone]  [facility] [address], [place], [place] [address] Phone: [phone]  [facility] [address] [place], [place], [place] [address] Phone: [phone]  Additional options for outpatient therapy:    [email or web address]

## 2020-10-03 NOTE — ED Notes (Signed)
As I write this, she is being evaluated via video conference by our T.T.S. team. Her husband remains with her also.

## 2020-10-03 NOTE — ED Provider Notes (Addendum)
[facility] EMERGENCY DEPT Provider Note   CSN: [id number] Arrival date & time: 10/03/20  1454     History Chief Complaint  Patient presents with  . Depression  . Hypertension    [name] is a 75 y.o. female.  Patient indicates has felt poorly for 'long while' now, many months, ?years, noticed more since covid started. Indicates feels anxious, depressed, mind races, short termper, decreased appetite, lack of happiness/joy.  Symptoms gradual onset, moderate, persistent, worsening. States news/world events seem stressors, no acute personal loss or stressful event. States several recent medical evals, to eye doctor as feels after 10 minutes of looking at tablet, or reading, feels like 'eyes cross' and/or that things arent as crisp/clear as they should be - was told eye exam was fine. Recent gi eval for frequent loose stools on chronic basis - denies definitive diagnosis. Recent rheumatology eval, and also indicates has seen pcp with above symptoms. Pt now questioning whether underlying anxiety and/or depression may be the problems. No hx previously needing eval and/or meds for mental health issues.   The history is provided by the patient.       Past Medical History:  Diagnosis Date  . Anxiety   . Arthritis   . Balance problem   . Depression   . Double vision 10/2014   bilateral  . Dyslipidemia   . High blood pressure   . Multiple thyroid nodules   . Paroxysmal SVT (supraventricular tachycardia) (HCC)   . PVC (premature ventricular contraction)    intermittent  . Syncope and collapse    last friday had alot of dizziness    Patient Active Problem List   Diagnosis Date Noted  . TIA (transient ischemic attack) 06/12/2015  . Amaurosis fugax   . Atypical migraine   . 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 Dizziness and giddiness 04/04/2013  . Abnormality of gait 02/15/2013  . Spinal stenosis of cervical region 02/15/2013  . Palpitations 11/28/2012   . History of supraventricular tachycardia 11/28/2012  . Essential hypertension 11/28/2012  . History of TIA (transient ischemic attack) 2005 11/28/2012    Past Surgical History:  Procedure Laterality Date  . ABDOMINAL HYSTERECTOMY    . catheter ablation    . TONSILLECTOMY       OB History    Gravida  0   Para  0   Term  0   Preterm  0   AB  0   Living  0     SAB  0   IAB  0   Ectopic  0   Multiple  0   Live Births  0           Family History  Problem Relation Age of Onset  . High blood pressure Father   . Stroke Father 85  . CAD Father 70       CABG.  Lived to be 85  . Arthritis Mother   . Neuropathy Mother     Social History   Tobacco Use  . Smoking status: Former Smoker    Packs/day: 0.00    Years: 20.00    Pack years: 0.00    Types: Cigarettes    Quit date: 12/11/1984    Years since quitting: 35.8  . Smokeless tobacco: Never Used  . Tobacco comment: Quit 40 years ago  Vaping Use  . Vaping Use: Never used  Substance Use Topics  . Alcohol use: No  . Drug use: No  Home Medications Prior to Admission medications   Medication Sig Start Date End Date Taking? Authorizing Provider  aspirin EC 81 MG EC tablet Take 1 tablet (81 mg total) by mouth daily. 06/13/15   Esperanza Sheets, MD  Cholecalciferol (VITAMIN D3) 2000 UNITS TABS Take 2,000 Units by mouth daily.    [provider]  diazepam (VALIUM) 5 MG tablet Take 5 mg by mouth every 8 (eight) hours as needed (dizziness).    [provider]  meclizine (ANTIVERT) 12.5 MG tablet Take 1 tablet (12.5 mg total) by mouth 3 (three) times daily as needed for dizziness. 11/18/14   Palumbo, April, MD  ondansetron (ZOFRAN) 4 MG tablet TAKE 1 TABLET (4 MG TOTAL) BY MOUTH EVERY 8 (EIGHT) HOURS AS NEEDED FOR NAUSEA. 12/31/14   [provider]    Allergies    Epinephrine, Contrast media [iodinated diagnostic agents], and Keflex [cephalexin]  Review of Systems   Review of  Systems  Constitutional: Negative for chills and fever.  HENT: Negative for sore throat.   Eyes: Negative for redness.  Respiratory: Negative for shortness of breath.   Cardiovascular: Negative for chest pain.  Gastrointestinal: Positive for diarrhea. Negative for abdominal pain and vomiting.  Endocrine: Negative for polyuria.  Genitourinary: Negative for dysuria.  Musculoskeletal: Negative for back pain and neck pain.  Skin: Negative for rash.  Neurological: Negative for speech difficulty, weakness and numbness.       No loss of normal function. Mild intermittent frontal headache, no acute, abrupt, severe, and/or constant head pain.   Hematological: Does not bruise/bleed easily.  Psychiatric/Behavioral: Positive for dysphoric mood. The patient is nervous/anxious.     Physical Exam Updated Vital Signs BP (!) 226/88 (BP Location: Left Arm)   Pulse 86   Temp 98.4 F (36.9 C) (Oral)   Resp 20   Ht 1.524 m (5')   Wt 63.5 kg   SpO2 99%   BMI 27.34 kg/m   Physical Exam  ED Results / Procedures / Treatments   Labs (all labs ordered are listed, but only abnormal results are displayed) Labs Reviewed - No data to display  EKG None  Radiology No results found.  Procedures Procedures   Medications Ordered in ED Medications - No data to display  ED Course  I have reviewed the triage vital signs and the nursing notes.  Pertinent labs & imaging results that were available during my care of the patient were reviewed by me and considered in my medical decision making (see chart for details).    MDM Rules/Calculators/A&P                         Reviewed nursing notes and prior charts for additional history.  Recent medical visits, labs, Care Everywhere documentation reviewed - recent chemistries, cbc, labs done - chem normal, cbc normal.   Given recent labs, do not feel benefit in repeating today.   BH team consulted to evaluate regarding anxiety and depression.   Ativan .5  mg po.   Po fluids/food provided.   1755, BH eval/note remains pending.   BH team has completed assessment, and indicates is psych clear for d/c, recommends outpatient follow up/counseling - resources provided.   In looking at current/past charts, bp has been high - will start low dose amlodipine. pcp f/u.  Pt currently appears stable for d/c.   Return precautions provided.        Final Clinical Impression(s) / ED Diagnoses Final  diagnoses:  None    Rx / DC Orders ED Discharge Orders    None        Cathren Laine, MD 10/03/20 1819

## 2020-10-03 NOTE — ED Notes (Signed)
She continues to speak with our T.T.S. team via video.

## 2020-10-03 NOTE — ED Triage Notes (Addendum)
Per EMS, pt c/o "not feeling well" for "a good long while".  Found to be hypertensive 200/100. Pt is noncompliant with BP meds. Pt appears anxious, states she is depressed because of the world's situation. Denies pain. When asked the suicide screening pt states she doesn't want to harm herself but it would be ok if she died.

## 2020-10-03 NOTE — BH Assessment (Addendum)
Comprehensive Clinical Assessment (CCA) Note  10/03/2020 [name] [id number]  Disposition: Per Hillery Jacks, NP patient does not meet inpatient criteria.  Outpatient therapy is recommended.  Patient has been provided with referral information for therapists/clinics that accept her insurance.  Info is included in the AVS to be provided to pt upon d/c.  The patient demonstrates the following risk factors for suicide: Chronic risk factors for suicide include: N/A. Acute risk factors for suicide include: social withdrawal/isolation. Protective factors for this patient include: positive social support, responsibility to others (children, family) and coping skills. Considering these factors, the overall suicide risk at this point appears to be low. Patient is appropriate for outpatient follow up.   Patient is a 75 year old female with no past psychiatric history who presents voluntarily to [facility] for assessment.  Patient presented reporting she has not been feeling well for approximately 2 years, "since Covid started."  She has found the Covid pandemic very challenging, and recently she has been very upset by the war in [place].  Patient becomes tearful, as she stated, "I'm not worrying about me, I'm concerned for the next generation.  Aren't we supposed to leave things better than we found them?   We have a demented leader and we can't trust any leaders."  Patient then asks if what she is feeling and experiencing is normal?  Patient informed she is not alone with how she is feeling.  She has been encouraged to consider counseling, as current symptoms have begun to interfere in her functioning.  Patient has discussed medication options with her PCP, however she is not open to medications at this point. She is initially hesitant to consider counseling, however with further discussion, encouragement and prompting from [facility] and her husband, patient is now open to this recommendation.  Patient  has requested referral information.  Patient denies SI, stating she only "wish I was dead sometimes."  Patient denies HI, AVH and SA history.  She does express concerns about memory lapses. She has been encouraged to address this with her PCP, should she need further testing.  Patient has no history of SI or attempts.  She identifies protective factors as her husband and other family.  She is able to affirm her safety.    Patient preferred that her husband, Al, stay for the assessment.  He is quite supportive and shares he is concerned that patient's depression is worsening.  He states, "She cries in the morning, during the day, in the evening, all of the time and even now."  He states he tries to support her, however he is now feeling he can't help his wife.  He is hoping she will consider connecting with a therapist, as recommended.     Chief Complaint:  Chief Complaint  Patient presents with  . Depression  . Hypertension   Visit Diagnosis: Untreated depression/anxiety    CCA Screening, Triage and Referral (STR)  Patient Reported Information How did you hear about [place]? Self  Referral name: No data recorded Referral phone number: No data recorded  Whom do you see for routine medical problems? -- (Dr. Nehemiah Settle)  Practice/Facility Name: No data recorded Practice/Facility Phone Number: No data recorded Name of Contact: No data recorded Contact Number: No data recorded Contact Fax Number: No data recorded Prescriber Name: No data recorded Prescriber Address (if known): No data recorded  What Is the Reason for Your Visit/Call Today? Patient presents with worsening depression and anxiety, leading her to wish she  were dead "sometimes."  Patient has a great support system and supportive providers.  She denies SI and has no history of attempts.  She states, "I just want to feel better, like I did two years ago."  Patient observed to be tearful on and off throughout assessment.  How Long Has  This Been Causing You Problems? > than 6 months  What Do You Feel Would Help You the Most Today? Treatment for Depression or other mood problem   Have You Recently Been in Any Inpatient Treatment (Hospital/Detox/Crisis Center/28-Day Program)? No  Name/Location of Program/Hospital:No data recorded How Long Were You There? No data recorded When Were You Discharged? No data recorded  [facility]? [facility]  [facility]   Have You Recently Had Any Thoughts About Hurting Yourself? No  Are You Planning to Commit Suicide/Harm Yourself At This time? No   Have you Recently Had Thoughts About Hurting Someone Karolee Ohslse? No  Explanation: No data recorded  Have You Used Any Alcohol or Drugs in the Past 24 Hours? No  How Long Ago Did You Use Drugs or Alcohol? No data recorded What Did You Use and How Much? No data recorded  Do You Currently Have a Therapist/Psychiatrist? No  Name of Therapist/Psychiatrist: No data recorded  Have You Been Recently Discharged From Any Office Practice or Programs? No  Explanation of Discharge From Practice/Program: No data recorded    CCA Screening Triage Referral Assessment Type of Contact: Tele-Assessment  Is this Initial or Reassessment? Initial Assessment  Date Telepsych consult ordered in [facility]:  10/03/2020  [facility]  [facility]   Patient Reported Information Reviewed? Yes  Patient Left Without Being Seen? No data recorded Reason for Not Completing Assessment: No data recorded  Collateral Involvement: Patient's husband, Al, was present and provided collateral.   Does Patient Have a Court Appointed Legal Guardian? No data recorded Name and Contact of Legal Guardian: No data recorded If Minor and Not Living with Parent(s), Who has Custody? No data recorded Is CPS involved or ever been involved? Never  Is APS involved or ever been involved?  Never   Patient Determined To Be At Risk for Harm To Self or Others Based on Review of Patient Reported Information or Presenting Complaint? No  Method: No data recorded Availability of Means: No data recorded Intent: No data recorded Notification Required: No data recorded Additional Information for Danger to Others Potential: No data recorded Additional Comments for Danger to Others Potential: No data recorded Are There Guns or Other Weapons in Your Home? No data recorded Types of Guns/Weapons: No data recorded Are These Weapons Safely Secured?                            No data recorded Who Could Verify You Are Able To Have These Secured: No data recorded Do You Have any Outstanding Charges, Pending Court Dates, Parole/Probation? No data recorded Contacted To Inform of Risk of Harm To Self or Others: No data recorded  Location of Assessment: -- ([facility])   Does Patient Present under Involuntary Commitment? No  IVC Papers Initial File Date: No data recorded  [place]   Patient Currently Receiving the Following Services: Not Receiving Services   Determination of Need: Routine (7 days)   Options For Referral: Outpatient Therapy     CCA Biopsychosocial Intake/Chief  Complaint:  Patient presents due to worsening anxiety and depression causing her to wish she were dead at times.  Current Symptoms/Problems: Patient also reports some short term memory loss, forgetting where she puts things, forgetting names etc.   Patient Reported Schizophrenia/Schizoaffective Diagnosis in Past: No   Strengths: Has support, open to recommendations, inquisitive  Preferences: No data recorded Abilities: No data recorded  Type of Services Patient Feels are Needed: Patient open to recommendations   Initial Clinical Notes/Concerns: No data recorded  Mental Health Symptoms Depression:  Difficulty Concentrating; Hopelessness; Tearfulness;  Increase/decrease in appetite; Worthlessness   Duration of Depressive symptoms: Greater than two weeks   Mania:  None   Anxiety:   Restlessness; Tension; Difficulty concentrating   Psychosis:  None   Duration of Psychotic symptoms: No data recorded  Trauma:  None   Obsessions:  None   Compulsions:  None   Inattention:  None   Hyperactivity/Impulsivity:  N/A   Oppositional/Defiant Behaviors:  N/A   Emotional Irregularity:  N/A   Other Mood/Personality Symptoms:  No data recorded   Mental Status Exam Appearance and self-care  Stature:  Average   Weight:  Average weight   Clothing:  Neat/clean   Grooming:  Well-groomed   Cosmetic use:  Age appropriate   Posture/gait:  Normal   Motor activity:  Not Remarkable   Sensorium  Attention:  Normal   Concentration:  Normal   Orientation:  X5   Recall/memory:  Defective in Short-term   Affect and Mood  Affect:  Appropriate   Mood:  Depressed   Relating  Eye contact:  Normal   Facial expression:  Depressed; Sad   Attitude toward examiner:  Cooperative   Thought and Language  Speech flow: Clear and Coherent   Thought content:  Appropriate to Mood and Circumstances   Preoccupation:  None   Hallucinations:  None   Organization:  No data recorded  [employer] of Knowledge:  Average   Intelligence:  Average   Abstraction:  Normal   Judgement:  Fair   Reality Testing:  Adequate   Insight:  Fair   Decision Making:  Normal   Social Functioning  Social Maturity:  Responsible   Social Judgement:  Normal   Stress  Stressors:  Other (Comment) (world challenges)   Coping Ability:  Exhausted; Overwhelmed   Skill Deficits:  Communication; Responsibility; Interpersonal   Supports:  Friends/Service system; Family     Religion: Religion/Spirituality Are You A Religious Person?: No  Leisure/Recreation: Leisure / Recreation Do You Have Hobbies?:  No  Exercise/Diet: Exercise/Diet Do You Exercise?: No Have You Gained or Lost A Significant Amount of Weight in the Past Six Months?: No Do You Follow a Special Diet?: No Do You Have Any Trouble Sleeping?: No   CCA Employment/Education Employment/Work Situation: Employment / Work Psychologist, occupational Employment situation: Retired Psychologist, clinical job has been impacted by current illness: No What is the longest time patient has a held a job?: NA Where was the patient employed at that time?: NA Has patient ever been in the [employer]?: No  Education: Education Is Patient Currently Attending School?: No Did Garment/textile technologist From [employer]?: Yes Did Theme park manager?:  (NA) What Type of College Degree Do you Have?: NA What Was Your Major?: NA Did You Have Any Special Interests In School?: NA Did You Have An Individualized Education Program (IIEP): No Did You Have Any Difficulty At School?: No Patient's Education Has Been Impacted by Current Illness: No  CCA Family/Childhood History Family and Relationship History: Family history Marital status: Married What types of issues is patient dealing with in the relationship?: No concerns, husband is presenta and quite supportive. Additional relationship information: NA Are you sexually active?: Yes What is your sexual orientation?: heterosexual Has your sexual activity been affected by drugs, alcohol, medication, or emotional stress?: NA Does patient have children?: No  Childhood History:  Childhood History By whom was/is the patient raised?: Both parents Additional childhood history information: "I had a normal childhood, good parents and no issues." Description of patient's relationship with caregiver when they were a child: See above Patient's description of current relationship with people who raised him/her: See above How were you disciplined when you got in trouble as a child/adolescent?: NA Does patient have siblings?:   (NA)  Child/Adolescent Assessment:     CCA Substance Use Alcohol/Drug Use: Alcohol / Drug Use Pain Medications: See MAR Prescriptions: See MAR Over the Counter: See MAR History of alcohol / drug use?: No history of alcohol / drug abuse     ASAM's:  Six Dimensions of Multidimensional Assessment  Dimension 1:  Acute Intoxication and/or Withdrawal Potential:      Dimension 2:  Biomedical Conditions and Complications:      Dimension 3:  Emotional, Behavioral, or Cognitive Conditions and Complications:     Dimension 4:  Readiness to Change:     Dimension 5:  Relapse, Continued use, or Continued Problem Potential:     Dimension 6:  Recovery/Living Environment:     ASAM Severity Score:    ASAM Recommended Level of Treatment:     Substance use Disorder (SUD)    Recommendations for Services/Supports/Treatments:    DSM5 Diagnoses: Patient Active Problem List   Diagnosis Date Noted  . TIA (transient ischemic attack) 06/12/2015  . Amaurosis fugax   . Atypical migraine   . 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 Dizziness and giddiness 04/04/2013  . Abnormality of gait 02/15/2013  . Spinal stenosis of cervical region 02/15/2013  . Palpitations 11/28/2012  . History of supraventricular tachycardia 11/28/2012  . Essential hypertension 11/28/2012  . History of TIA (transient ischemic attack) 2005 11/28/2012    Patient Centered Plan: Patient is on the following Treatment Plan(s):  Anxiety and Depression   Referrals to Alternative Service(s): Referred to Alternative Service(s):   Place:   Date:   Time:    Referred to Alternative Service(s):   Place:   Date:   Time:    Referred to Alternative Service(s):   Place:   Date:   Time:    Referred to Alternative Service(s):   Place:   Date:   Time:     Yetta Glassman, [facility]

## 2021-05-06 ENCOUNTER — Other Ambulatory Visit: Payer: Self-pay | Admitting: Orthopaedic Surgery

## 2021-05-06 DIAGNOSIS — M25562 Pain in left knee: Secondary | ICD-10-CM

## 2021-05-26 ENCOUNTER — Other Ambulatory Visit: Payer: Medicare Other

## 2021-06-09 DIAGNOSIS — R630 Anorexia: Secondary | ICD-10-CM | POA: Insufficient documentation

## 2021-06-09 DIAGNOSIS — R61 Generalized hyperhidrosis: Secondary | ICD-10-CM | POA: Insufficient documentation

## 2021-06-11 ENCOUNTER — Ambulatory Visit
Admission: RE | Admit: 2021-06-11 | Discharge: 2021-06-11 | Disposition: A | Discharge status: Home / Self Care | Payer: Medicare Other | Source: Ambulatory Visit | Attending: Orthopaedic Surgery | Admitting: Orthopaedic Surgery

## 2021-06-11 ENCOUNTER — Other Ambulatory Visit: Payer: Self-pay

## 2021-06-11 DIAGNOSIS — M25562 Pain in left knee: Secondary | ICD-10-CM

## 2021-06-18 ENCOUNTER — Other Ambulatory Visit: Payer: Self-pay

## 2021-06-18 ENCOUNTER — Encounter: Payer: Self-pay | Admitting: *Deleted

## 2021-06-18 ENCOUNTER — Encounter: Payer: Self-pay | Admitting: Cardiology

## 2021-06-18 ENCOUNTER — Ambulatory Visit (INDEPENDENT_AMBULATORY_CARE_PROVIDER_SITE_OTHER): Payer: Medicare Other | Admitting: Cardiology

## 2021-06-18 VITALS — BP 150/80 | HR 74 | Ht 60.0 in | Wt 146.6 lb

## 2021-06-18 DIAGNOSIS — R079 Chest pain, unspecified: Secondary | ICD-10-CM | POA: Diagnosis not present

## 2021-06-18 DIAGNOSIS — I471 Supraventricular tachycardia: Secondary | ICD-10-CM | POA: Diagnosis not present

## 2021-06-18 DIAGNOSIS — I10 Essential (primary) hypertension: Secondary | ICD-10-CM

## 2021-06-18 DIAGNOSIS — R072 Precordial pain: Secondary | ICD-10-CM

## 2021-06-18 DIAGNOSIS — Z01812 Encounter for preprocedural laboratory examination: Secondary | ICD-10-CM

## 2021-06-18 DIAGNOSIS — E782 Mixed hyperlipidemia: Secondary | ICD-10-CM

## 2021-06-18 LAB — BASIC METABOLIC PANEL
BUN/Creatinine Ratio: 23 (ref 12–28)
BUN: 19 mg/dL (ref 8–27)
CO2: 25 mmol/L (ref 20–29)
Calcium: 9.7 mg/dL (ref 8.7–10.3)
Chloride: 101 mmol/L (ref 96–106)
Creatinine, Ser: 0.82 mg/dL (ref 0.57–1.00)
Glucose: 95 mg/dL (ref 70–99)
Potassium: 4.1 mmol/L (ref 3.5–5.2)
Sodium: 140 mmol/L (ref 134–144)
eGFR: 74 mL/min/{1.73_m2} (ref >59)

## 2021-06-18 MED ORDER — PREDNISONE 50 MG PO TABS: ORAL_TABLET | ORAL | 0 refills | Status: DC

## 2021-06-18 MED ORDER — METOPROLOL TARTRATE 100 MG PO TABS: 100.0000 mg | ORAL_TABLET | Freq: Once | ORAL | 0 refills | Status: DC

## 2021-06-18 NOTE — Patient Instructions (Signed)
Medication Instructions:  The current medical regimen is effective;  continue present plan and medications.  *If you need a refill on your cardiac medications before your next appointment, please call your pharmacy*  Lab Work: Please have blood work today (BMP)  If you have labs (blood work) drawn today and your tests are completely normal, you will receive your results only by: MyChart Message (if you have MyChart) OR A paper copy in the mail If you have any lab test that is abnormal or we need to change your treatment, we will call you to review the results.   Testing/Procedures:   Your cardiac CT will be scheduled at one of the below locations:   [facility] [address] [place], [place] [address] [phone]  Please arrive at the [facility] main entrance (entrance A) of [facility] 30 minutes prior to test start time. You can use the FREE valet parking offered at the main entrance (encouraged to control the heart rate for the test) Proceed to the [facility] Radiology Department (first floor) to check-in and test prep.  Please follow these instructions carefully (unless otherwise directed):  On the Night Before the Test: Be sure to Drink plenty of water. Do not consume any caffeinated/decaffeinated beverages or chocolate 12 hours prior to your test. Do not take any antihistamines 12 hours prior to your test. If the patient has contrast allergy: Patient will need a prescription for Prednisone and very clear instructions (as follows): Prednisone 50 mg - take 13 hours prior to test Take another Prednisone 50 mg 7 hours prior to test Take another Prednisone 50 mg 1 hour prior to test Take Benadryl 50 mg 1 hour prior to test Patient must complete all four doses of above prophylactic medications. Patient will need a ride after test due to Benadryl.  On the Day of the Test: Drink plenty of water until 1 hour prior to the test. Do not eat any food 4  hours prior to the test. You may take your regular medications prior to the test.  Take metoprolol (Lopressor) two hours prior to test. HOLD Furosemide/Hydrochlorothiazide morning of the test. FEMALES- please wear underwire-free bra if available, avoid dresses & tight clothing  After the Test: Drink plenty of water. After receiving IV contrast, you may experience a mild flushed feeling. This is normal. On occasion, you may experience a mild rash up to 24 hours after the test. This is not dangerous. If this occurs, you can take Benadryl 25 mg and increase your fluid intake. If you experience trouble breathing, this can be serious. If it is severe call 911 IMMEDIATELY. If it is mild, please call our office.  Please allow 2-4 weeks for scheduling of routine cardiac CTs. Some insurance companies require a pre-authorization which may delay scheduling of this test.   For non-scheduling related questions, please contact the cardiac imaging nurse navigator should you have any questions/concerns: Rockwell Alexandria, Cardiac Imaging Nurse Navigator Larey Brick, Cardiac Imaging Nurse Navigator [facility] Direct Office Dial: [phone]   For scheduling needs, including cancellations and rescheduling, please call [place], [phone].  Follow-Up: At [facility], you and your health needs are our priority.  As part of our continuing mission to provide you with exceptional heart care, we have created designated Provider Care Teams.  These Care Teams include your primary Cardiologist (physician) and Advanced Practice Providers (APPs -  Physician Assistants and Nurse Practitioners) who all work together to provide you  with the care you need, when you need it.  We recommend signing up for the patient portal called "MyChart".  Sign up information is provided on this After Visit Summary.  MyChart is used to connect with patients for Virtual Visits (Telemedicine).  Patients are  able to view lab/test results, encounter notes, upcoming appointments, etc.  Non-urgent messages can be sent to your provider as well.   To learn more about what you can do with MyChart, go to [email or web address].    Your next appointment:   2 month(s)  The format for your next appointment:   In Person  Provider:   Dr Donato Schultz If MD is not listed, click here to update    :1}   Thank you for choosing [facility]!!

## 2021-06-18 NOTE — Assessment & Plan Note (Addendum)
I am concerned about her symptoms of what she is describing as heartburn radiating up to her throat.  Given her prior TIA, known atherosclerosis of carotids, I will go ahead and proceed with coronary CT scan with possible FFR analysis.  She does state that she had a flushing feeling with contrast in the past.  It is also noted that she had some itching in her throat.  Because of this, we will go ahead and premedicate her appropriately.  Metoprolol as well prior to scan.  Heart rate today 74 bpm.  Based upon the symptoms, I would like for her to hold off on further knee surgery, meniscal tear surgery until we understand her cardiac history more clearly.  Given her longstanding mixed hyperlipidemia, there is the possibility of advanced coronary artery disease.  In addition, after scan and discovery of coronary plaque, I would like for her to sit down with the lipid clinic to discuss options for lipid management.  Given her prior TIA, known carotid plaque, I would like for her LDL to be less than 55.  I think that she may require PCSK9 inhibitor as well as Vascepa.

## 2021-06-18 NOTE — Progress Notes (Signed)
Cardiology Office Note:    Date:  06/18/2021   ID:  SHARONLEE NINE, DOB [birth year], MRN [id number]  PCP:  Renford Dills, MD   [facility] Providers Cardiologist:  None     Referring MD: Renford Dills, MD    History of Present Illness:    [name] is a 75 y.o. female here for the evaluation of SVT, chest burning at the request of Dr. Nehemiah Settle.  I see her husband as well.  Has prior history of SVT/paroxysmal atrial tachycardia.  She is treated for obstructive sleep apnea with CPAP.  Prior TIA in 2005.  2 times a month feels heart racing. Unprovoked. Random. Close to fainting not though. Has murmur. Been feeling heart burn lately. Mostly when laying.  Feels a burning sensation that radiates up chest wall to throat.  Feels light headed at times.  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 Prior 200 bpm. Vagal manuver. Went to [facility].  Ablation was in 1999.   CT IV heat feeling. No anaphylaxis Former smoker quit in the year 1985.  Quite heavy prior to that.  Recent labs from outside shows total cholesterol 262 triglycerides 395 LDL 143 HDL 47.  Hemoglobin A1c 6.2 creatinine 0.87 hemoglobin 14.1 ALT 15  Past Medical History:  Diagnosis Date   Anxiety    Arthritis    Balance problem    Depression    Double vision 10/2014   bilateral   Dyslipidemia    High blood pressure    Multiple thyroid nodules    OSA (obstructive sleep apnea)    Paroxysmal SVT (supraventricular tachycardia) (HCC)    PVC (premature ventricular contraction)    intermittent   Syncope and collapse    last friday had alot of dizziness    Past Surgical History:  Procedure Laterality Date   ABDOMINAL HYSTERECTOMY     catheter ablation     TONSILLECTOMY      Current Medications: Current Meds  Medication Sig   aspirin EC 81 MG tablet Take 81 mg by mouth as needed. Swallow whole.    metoprolol tartrate (LOPRESSOR) 100 MG tablet Take 1 tablet (100 mg total) by mouth once for 1 dose. Take 1 tablet 2 hours before your CT scan   predniSONE (DELTASONE) 50 MG tablet Take 1 tab 13 hrs before, one 7 hrs before and one 1 hr before your CT scan     Allergies:   Epinephrine, Contrast media [iodinated contrast media], and Keflex [cephalexin]   Social History   Socioeconomic History   Marital status: Married    Spouse name: [name]   Number of children: 0   Years of education: 12   Highest education level: Not on file  Occupational History    Employer: RETIRED    Comment: retired  Tobacco Use   Smoking status: Former    Packs/day: 0.00    Years: 20.00    Pack years: 0.00    Types: Cigarettes    Quit date: 12/11/1984    Years since quitting: 36.5   Smokeless tobacco: Never   Tobacco comments:    Quit 40 years ago  Vaping Use   Vaping Use: Never used  Substance and Sexual Activity   Alcohol use: No   Drug use: No   Sexual activity: Yes    Partners: Male    Comment: married- 52 yrs  Other Topics Concern   Not on file  Social History Narrative   Patient lives at home with [name]). Patient is retired.    Social Determinants of Health   Financial Resource Strain: Not on file  Food Insecurity: Not on file  Transportation Needs: Not on file  Physical Activity: Not on file  Stress: Not on file  Social Connections: Not on file     Family History: The patient's family history includes Arthritis in her mother; CAD (age of onset: 60) in her father; High blood pressure in her father; Neuropathy in her mother; Stroke (age of onset: 59) in her father.  ROS:   Please see the history of present illness.    Feels hot at times, no syncope no bleeding all other systems reviewed and are negative.  EKGs/Labs/Other Studies Reviewed:    The following studies were reviewed today: Event monitor 11/28/2012: - Brief episodes of paroxysmal atrial tachycardia.  No  evidence of atrial fibrillation.  Echocardiogram 06/12/2015: EF 70% grade 1 diastolic dysfunction trivial aortic regurgitation  Carotid Doppler 2019: Mixed plaque right carotid, moderate plaque left carotid   EKG:  EKG is  ordered today.  The ekg ordered today demonstrates sinus rhythm 74, EKG from 07/14/2011 shows sinus rhythm 70 with nonspecific ST-T wave changes.  Personally reviewed.  Recent Labs: No results found for requested labs within last 8760 hours.  Recent Lipid Panel    Component Value Date/Time   CHOL 257 (H) 06/12/2015 0525   TRIG 139 06/12/2015 0525   HDL 50 06/12/2015 0525   CHOLHDL 5.1 06/12/2015 0525   VLDL 28 06/12/2015 0525   LDLCALC 179 (H) 06/12/2015 0525     Risk Assessment/Calculations:              Physical Exam:    VS:  BP (!) 150/80    Pulse 74    Ht 5' (1.524 m)    Wt 146 lb 9.6 oz (66.5 kg)    SpO2 96%    BMI 28.63 kg/m     Wt Readings from Last 3 Encounters:  06/18/21 146 lb 9.6 oz (66.5 kg)  10/03/20 140 lb (63.5 kg)  12/09/19 140 lb (63.5 kg)     GEN:  Well nourished, well developed in no acute distress HEENT: Normal NECK: No JVD; No carotid bruits LYMPHATICS: No lymphadenopathy CARDIAC: RRR, 1/6 SM, no rubs, gallops RESPIRATORY:  Clear to auscultation without rales, wheezing or rhonchi  ABDOMEN: Soft, non-tender, non-distended MUSCULOSKELETAL:  No edema; No deformity  SKIN: Warm and dry NEUROLOGIC:  Alert and oriented x 3 PSYCHIATRIC:  Normal affect   ASSESSMENT:    1. Precordial pain   2. Paroxysmal atrial tachycardia (HCC)   3. Essential hypertension   4. Chest pain of uncertain etiology   5. Mixed hyperlipidemia   6. Pre-procedure lab exam    PLAN:    In order of problems listed above:  Paroxysmal atrial tachycardia (HCC) Prior event monitor reviewed in 2014, short episodes of PAT lasting up to 10 beats duration, 136 bpm.  In the future, consider ZIO monitor for further evaluation.  Essential  hypertension Currently on low-dose amlodipine 2.5 mg once a day.  In the past she had been on verapamil.  Chest pain of uncertain etiology I am concerned about her symptoms of what she is describing as heartburn radiating up to her throat.  Given her prior TIA, known atherosclerosis of carotids, I will go ahead and proceed with coronary CT scan with  possible FFR analysis.  She does state that she had a flushing feeling with contrast in the past.  It is also noted that she had some itching in her throat.  Because of this, we will go ahead and premedicate her appropriately.  Metoprolol as well prior to scan.  Heart rate today 74 bpm.  Based upon the symptoms, I would like for her to hold off on further knee surgery, meniscal tear surgery until we understand her cardiac history more clearly.  Given her longstanding mixed hyperlipidemia, there is the possibility of advanced coronary artery disease.  In addition, after scan and discovery of coronary plaque, I would like for her to sit down with the lipid clinic to discuss options for lipid management.  Given her prior TIA, known carotid plaque, I would like for her LDL to be less than 55.  I think that she may require PCSK9 inhibitor as well as Vascepa.  Mixed hyperlipidemia Triglycerides have been as high at 700.  She is not currently on any lipid-lowering medications.  Lab work as described above.  After CT scan, we will likely refer to the lipid clinic for further management, I would like her LDL to be less than 55 with her prior TIA.      Medication Adjustments/Labs and Tests Ordered: Current medicines are reviewed at length with the patient today.  Concerns regarding medicines are outlined above.  Orders Placed This Encounter  Procedures   CT CORONARY MORPH W/CTA COR W/SCORE W/CA W/CM &/OR WO/CM   Basic metabolic panel   EKG 12-Lead   Meds ordered this encounter  Medications   predniSONE (DELTASONE) 50 MG tablet    Sig: Take 1 tab 13 hrs  before, one 7 hrs before and one 1 hr before your CT scan    Dispense:  3 tablet    Refill:  0   metoprolol tartrate (LOPRESSOR) 100 MG tablet    Sig: Take 1 tablet (100 mg total) by mouth once for 1 dose. Take 1 tablet 2 hours before your CT scan    Dispense:  1 tablet    Refill:  0    Patient Instructions  Medication Instructions:  The current medical regimen is effective;  continue present plan and medications.  *If you need a refill on your cardiac medications before your next appointment, please call your pharmacy*  Lab Work: Please have blood work today (BMP)  If you have labs (blood work) drawn today and your tests are completely normal, you will receive your results only by: MyChart Message (if you have MyChart) OR A paper copy in the mail If you have any lab test that is abnormal or we need to change your treatment, we will call you to review the results.   Testing/Procedures:   Your cardiac CT will be scheduled at one of the below locations:   [facility] [address] [place], [place] [address] [phone]  Please arrive at the [facility] main entrance (entrance A) of [facility] 30 minutes prior to test start time. You can use the FREE valet parking offered at the main entrance (encouraged to control the heart rate for the test) Proceed to the [facility] Radiology Department (first floor) to check-in and test prep.  Please follow these instructions carefully (unless otherwise directed):  On the Night Before the Test: Be sure to Drink plenty of water. Do not consume any caffeinated/decaffeinated beverages or chocolate 12 hours prior to your test. Do not take  any antihistamines 12 hours prior to your test. If the patient has contrast allergy: Patient will need a prescription for Prednisone and very clear instructions (as follows): Prednisone 50 mg - take 13 hours prior to test Take another Prednisone 50 mg 7 hours prior to test Take  another Prednisone 50 mg 1 hour prior to test Take Benadryl 50 mg 1 hour prior to test Patient must complete all four doses of above prophylactic medications. Patient will need a ride after test due to Benadryl.  On the Day of the Test: Drink plenty of water until 1 hour prior to the test. Do not eat any food 4 hours prior to the test. You may take your regular medications prior to the test.  Take metoprolol (Lopressor) two hours prior to test. HOLD Furosemide/Hydrochlorothiazide morning of the test. FEMALES- please wear underwire-free bra if available, avoid dresses & tight clothing  After the Test: Drink plenty of water. After receiving IV contrast, you may experience a mild flushed feeling. This is normal. On occasion, you may experience a mild rash up to 24 hours after the test. This is not dangerous. If this occurs, you can take Benadryl 25 mg and increase your fluid intake. If you experience trouble breathing, this can be serious. If it is severe call 911 IMMEDIATELY. If it is mild, please call our office.  Please allow 2-4 weeks for scheduling of routine cardiac CTs. Some insurance companies require a pre-authorization which may delay scheduling of this test.   For non-scheduling related questions, please contact the cardiac imaging nurse navigator should you have any questions/concerns: Rockwell Alexandria, Cardiac Imaging Nurse Navigator Larey Brick, Cardiac Imaging Nurse Navigator [place] [facility] Direct Office Dial: [phone]   For scheduling needs, including cancellations and rescheduling, please call [place], [phone].  Follow-Up: At [facility], you and your health needs are our priority.  As part of our continuing mission to provide you with exceptional heart care, we have created designated Provider Care Teams.  These Care Teams include your primary Cardiologist (physician) and Advanced Practice Providers (APPs -  Physician Assistants and  Nurse Practitioners) who all work together to provide you with the care you need, when you need it.  We recommend signing up for the patient portal called "MyChart".  Sign up information is provided on this After Visit Summary.  MyChart is used to connect with patients for Virtual Visits (Telemedicine).  Patients are able to view lab/test results, encounter notes, upcoming appointments, etc.  Non-urgent messages can be sent to your provider as well.   To learn more about what you can do with MyChart, go to [email or web address].    Your next appointment:   2 month(s)  The format for your next appointment:   In Person  Provider:   Dr Donato Schultz If MD is not listed, click here to update    :1}   Thank you for choosing [facility]!!      Signed, Donato Schultz, MD  06/18/2021 12:38 PM    [facility]

## 2021-06-18 NOTE — Assessment & Plan Note (Signed)
Currently on low-dose amlodipine 2.5 mg once a day.  In the past she had been on verapamil.

## 2021-06-18 NOTE — Assessment & Plan Note (Signed)
Triglycerides have been as high at 700.  She is not currently on any lipid-lowering medications.  Lab work as described above.  After CT scan, we will likely refer to the lipid clinic for further management, I would like her LDL to be less than 55 with her prior TIA.

## 2021-06-18 NOTE — Assessment & Plan Note (Addendum)
Prior event monitor reviewed in 2014, short episodes of PAT lasting up to 10 beats duration, 136 bpm.  In the future, consider ZIO monitor for further evaluation.

## 2021-06-19 ENCOUNTER — Other Ambulatory Visit: Payer: Self-pay | Admitting: Cardiology

## 2021-06-20 ENCOUNTER — Other Ambulatory Visit: Payer: Self-pay | Admitting: Cardiology

## 2021-06-21 ENCOUNTER — Other Ambulatory Visit: Payer: Self-pay | Admitting: Cardiology

## 2021-06-22 ENCOUNTER — Other Ambulatory Visit: Payer: Self-pay | Admitting: Cardiology

## 2021-06-23 ENCOUNTER — Other Ambulatory Visit: Payer: Self-pay

## 2021-06-23 ENCOUNTER — Emergency Department (HOSPITAL_BASED_OUTPATIENT_CLINIC_OR_DEPARTMENT_OTHER)
Admission: EM | Admit: 2021-06-23 | Discharge: 2021-06-23 | Disposition: A | Discharge status: Home / Self Care | Payer: Medicare Other | Attending: Emergency Medicine | Admitting: Emergency Medicine

## 2021-06-23 ENCOUNTER — Encounter (HOSPITAL_BASED_OUTPATIENT_CLINIC_OR_DEPARTMENT_OTHER): Payer: Self-pay | Admitting: *Deleted

## 2021-06-23 DIAGNOSIS — Z5321 Procedure and treatment not carried out due to patient leaving prior to being seen by health care provider: Secondary | ICD-10-CM | POA: Diagnosis not present

## 2021-06-23 DIAGNOSIS — R1013 Epigastric pain: Secondary | ICD-10-CM | POA: Insufficient documentation

## 2021-06-23 DIAGNOSIS — M549 Dorsalgia, unspecified: Secondary | ICD-10-CM | POA: Insufficient documentation

## 2021-06-23 DIAGNOSIS — R4189 Other symptoms and signs involving cognitive functions and awareness: Secondary | ICD-10-CM | POA: Insufficient documentation

## 2021-06-23 LAB — LIPASE, BLOOD: Lipase: 23 U/L (ref 11–51)

## 2021-06-23 LAB — COMPREHENSIVE METABOLIC PANEL
ALT: 16 U/L (ref 0–44)
AST: 20 U/L (ref 15–41)
Albumin: 4.4 g/dL (ref 3.5–5.0)
Alkaline Phosphatase: 66 U/L (ref 38–126)
Anion gap: 10 (ref 5–15)
BUN: 20 mg/dL (ref 8–23)
CO2: 26 mmol/L (ref 22–32)
Calcium: 9.7 mg/dL (ref 8.9–10.3)
Chloride: 102 mmol/L (ref 98–111)
Creatinine, Ser: 0.96 mg/dL (ref 0.44–1.00)
GFR, Estimated: >60 mL/min (ref >60)
Glucose, Bld: 89 mg/dL (ref 70–99)
Potassium: 3.5 mmol/L (ref 3.5–5.1)
Sodium: 138 mmol/L (ref 135–145)
Total Bilirubin: 0.7 mg/dL (ref 0.3–1.2)
Total Protein: 7.2 g/dL (ref 6.5–8.1)

## 2021-06-23 LAB — CBC
HCT: 39.9 % (ref 36.0–46.0)
Hemoglobin: 13.3 g/dL (ref 12.0–15.0)
MCH: 30.6 pg (ref 26.0–34.0)
MCHC: 33.3 g/dL (ref 30.0–36.0)
MCV: 91.7 fL (ref 80.0–100.0)
Platelets: 243 10*3/uL (ref 150–400)
RBC: 4.35 MIL/uL (ref 3.87–5.11)
RDW: 12.6 % (ref 11.5–15.5)
WBC: 4.9 10*3/uL (ref 4.0–10.5)
nRBC: 0 % (ref 0.0–0.2)

## 2021-06-23 NOTE — ED Triage Notes (Addendum)
Pt states she had symptoms of a TIA l;ast night with some memory deficit, still feels "fuzzy" in her head., today presents with epigastric and back pain. Epigastric pain with eating or drinking, occ when not eating or drinking. Pt then proceeds to state she had a headache yesterday but has resolved and some vision problems tha have resolved.

## 2021-07-05 ENCOUNTER — Telehealth (HOSPITAL_COMMUNITY): Payer: Self-pay | Admitting: Emergency Medicine

## 2021-07-05 NOTE — Telephone Encounter (Signed)
Attempted to call patient regarding upcoming cardiac CT appointment. °Left message on voicemail with name and callback number °Areg Bialas RN Navigator Cardiac Imaging °[facility] [place] [facility] °[phone] Office °[phone] Cell ° °

## 2021-07-06 ENCOUNTER — Ambulatory Visit (HOSPITAL_COMMUNITY): Admission: RE | Admit: 2021-07-06 | Payer: Medicare Other | Source: Ambulatory Visit

## 2021-07-09 ENCOUNTER — Telehealth: Payer: Self-pay | Admitting: Cardiology

## 2021-07-09 NOTE — Telephone Encounter (Signed)
Spoke with patient who has concerns related to the contrast.  She has a list contrast allergy.  Was given rx for prednisone/benadryl.  She does not want to take these.  She reports when she had contrast the last time she felt as if she would loose control of her bowel and bladder and does not want to have this testing again. Reviewed order for CA score and Lexiscan.  Explained both test and the reason for them for surgical clearance.  Pt states she had a lexiscan before and hated the way it made her feel and would "rather crawl on the treadmill than to take that stuff again."  Advised pt at this point, these are the only options we have for cardiac clearance.  Pt is going to think about them and call back if she decides which one she wants to have for clearance.

## 2021-07-09 NOTE — Telephone Encounter (Signed)
Patient states she has reservations about the CT with the dye. She says she would like to have the CT without the dye or have a different test.

## 2021-07-09 NOTE — Telephone Encounter (Signed)
Will have Dr Anne Fu to review and will call pt back

## 2021-07-09 NOTE — Telephone Encounter (Signed)
I understand.  Lets go ahead and order a calcium score, $99 test and also order a Lexiscan stress test to evaluate blood flow.  Okay to cancel coronary CT. Donato Schultz, MD

## 2021-07-15 NOTE — Telephone Encounter (Signed)
Pt has appt with Dr Marlou Porch 08/19/21.  Will close this encounter since pt has not called back and has f/u appt in 3 days.

## 2021-08-19 ENCOUNTER — Ambulatory Visit: Payer: Medicare Other | Admitting: Cardiology

## 2021-11-16 ENCOUNTER — Observation Stay (HOSPITAL_COMMUNITY): Payer: Medicare Other

## 2021-11-16 ENCOUNTER — Inpatient Hospital Stay (HOSPITAL_COMMUNITY)
Admission: EM | Admit: 2021-11-16 | Discharge: 2021-11-18 | DRG: 065 | Disposition: A | Discharge status: Home / Self Care | Payer: Medicare Other | Attending: Internal Medicine | Admitting: Internal Medicine

## 2021-11-16 ENCOUNTER — Encounter (HOSPITAL_COMMUNITY): Payer: Self-pay | Admitting: Internal Medicine

## 2021-11-16 ENCOUNTER — Other Ambulatory Visit: Payer: Self-pay

## 2021-11-16 ENCOUNTER — Emergency Department (HOSPITAL_COMMUNITY): Payer: Medicare Other

## 2021-11-16 DIAGNOSIS — R279 Unspecified lack of coordination: Secondary | ICD-10-CM | POA: Diagnosis not present

## 2021-11-16 DIAGNOSIS — I672 Cerebral atherosclerosis: Secondary | ICD-10-CM | POA: Diagnosis present

## 2021-11-16 DIAGNOSIS — Z8673 Personal history of transient ischemic attack (TIA), and cerebral infarction without residual deficits: Secondary | ICD-10-CM

## 2021-11-16 DIAGNOSIS — Z91148 Patient's other noncompliance with medication regimen for other reason: Secondary | ICD-10-CM

## 2021-11-16 DIAGNOSIS — F419 Anxiety disorder, unspecified: Secondary | ICD-10-CM | POA: Diagnosis present

## 2021-11-16 DIAGNOSIS — Z87891 Personal history of nicotine dependence: Secondary | ICD-10-CM

## 2021-11-16 DIAGNOSIS — F32A Depression, unspecified: Secondary | ICD-10-CM | POA: Diagnosis present

## 2021-11-16 DIAGNOSIS — R29818 Other symptoms and signs involving the nervous system: Secondary | ICD-10-CM

## 2021-11-16 DIAGNOSIS — I160 Hypertensive urgency: Secondary | ICD-10-CM | POA: Diagnosis present

## 2021-11-16 DIAGNOSIS — I63542 Cerebral infarction due to unspecified occlusion or stenosis of left cerebellar artery: Principal | ICD-10-CM | POA: Diagnosis present

## 2021-11-16 DIAGNOSIS — Z90710 Acquired absence of both cervix and uterus: Secondary | ICD-10-CM

## 2021-11-16 DIAGNOSIS — R112 Nausea with vomiting, unspecified: Secondary | ICD-10-CM | POA: Diagnosis present

## 2021-11-16 DIAGNOSIS — R29701 NIHSS score 1: Secondary | ICD-10-CM | POA: Diagnosis present

## 2021-11-16 DIAGNOSIS — Z9181 History of falling: Secondary | ICD-10-CM

## 2021-11-16 DIAGNOSIS — Z79899 Other long term (current) drug therapy: Secondary | ICD-10-CM

## 2021-11-16 DIAGNOSIS — I639 Cerebral infarction, unspecified: Secondary | ICD-10-CM | POA: Diagnosis present

## 2021-11-16 DIAGNOSIS — I471 Supraventricular tachycardia: Secondary | ICD-10-CM | POA: Diagnosis present

## 2021-11-16 DIAGNOSIS — Z91041 Radiographic dye allergy status: Secondary | ICD-10-CM

## 2021-11-16 DIAGNOSIS — I10 Essential (primary) hypertension: Secondary | ICD-10-CM | POA: Diagnosis present

## 2021-11-16 DIAGNOSIS — R270 Ataxia, unspecified: Secondary | ICD-10-CM | POA: Diagnosis present

## 2021-11-16 DIAGNOSIS — Z8249 Family history of ischemic heart disease and other diseases of the circulatory system: Secondary | ICD-10-CM

## 2021-11-16 DIAGNOSIS — Z20822 Contact with and (suspected) exposure to covid-19: Secondary | ICD-10-CM | POA: Diagnosis present

## 2021-11-16 DIAGNOSIS — Z7982 Long term (current) use of aspirin: Secondary | ICD-10-CM

## 2021-11-16 DIAGNOSIS — T465X6A Underdosing of other antihypertensive drugs, initial encounter: Secondary | ICD-10-CM | POA: Diagnosis present

## 2021-11-16 DIAGNOSIS — E782 Mixed hyperlipidemia: Secondary | ICD-10-CM | POA: Diagnosis present

## 2021-11-16 DIAGNOSIS — Z8261 Family history of arthritis: Secondary | ICD-10-CM

## 2021-11-16 DIAGNOSIS — Z888 Allergy status to other drugs, medicaments and biological substances status: Secondary | ICD-10-CM

## 2021-11-16 DIAGNOSIS — Z881 Allergy status to other antibiotic agents status: Secondary | ICD-10-CM

## 2021-11-16 DIAGNOSIS — G4733 Obstructive sleep apnea (adult) (pediatric): Secondary | ICD-10-CM | POA: Diagnosis present

## 2021-11-16 DIAGNOSIS — R42 Dizziness and giddiness: Secondary | ICD-10-CM

## 2021-11-16 DIAGNOSIS — Z823 Family history of stroke: Secondary | ICD-10-CM

## 2021-11-16 DIAGNOSIS — M1990 Unspecified osteoarthritis, unspecified site: Secondary | ICD-10-CM | POA: Diagnosis present

## 2021-11-16 LAB — COMPREHENSIVE METABOLIC PANEL
ALT: 17 U/L (ref 0–44)
AST: 21 U/L (ref 15–41)
Albumin: 4 g/dL (ref 3.5–5.0)
Alkaline Phosphatase: 70 U/L (ref 38–126)
Anion gap: 11 (ref 5–15)
BUN: 9 mg/dL (ref 8–23)
CO2: 23 mmol/L (ref 22–32)
Calcium: 9.5 mg/dL (ref 8.9–10.3)
Chloride: 104 mmol/L (ref 98–111)
Creatinine, Ser: 0.91 mg/dL (ref 0.44–1.00)
GFR, Estimated: >60 mL/min (ref >60)
Glucose, Bld: 105 mg/dL — ABNORMAL HIGH (ref 70–99)
Potassium: 3.5 mmol/L (ref 3.5–5.1)
Sodium: 138 mmol/L (ref 135–145)
Total Bilirubin: 0.7 mg/dL (ref 0.3–1.2)
Total Protein: 6.8 g/dL (ref 6.5–8.1)

## 2021-11-16 LAB — DIFFERENTIAL
Abs Immature Granulocytes: 0.06 10*3/uL (ref 0.00–0.07)
Basophils Absolute: 0.1 10*3/uL (ref 0.0–0.1)
Basophils Relative: 1 %
Eosinophils Absolute: 0 10*3/uL (ref 0.0–0.5)
Eosinophils Relative: 1 %
Immature Granulocytes: 1 %
Lymphocytes Relative: 12 %
Lymphs Abs: 0.8 10*3/uL (ref 0.7–4.0)
Monocytes Absolute: 0.5 10*3/uL (ref 0.1–1.0)
Monocytes Relative: 7 %
Neutro Abs: 5.1 10*3/uL (ref 1.7–7.7)
Neutrophils Relative %: 78 %

## 2021-11-16 LAB — URINALYSIS, ROUTINE W REFLEX MICROSCOPIC
Bilirubin Urine: NEGATIVE
Glucose, UA: NEGATIVE mg/dL
Hgb urine dipstick: NEGATIVE
Ketones, ur: 5 mg/dL — AB
Leukocytes,Ua: NEGATIVE
Nitrite: NEGATIVE
Protein, ur: NEGATIVE mg/dL
Specific Gravity, Urine: 1.012 (ref 1.005–1.030)
pH: 6 (ref 5.0–8.0)

## 2021-11-16 LAB — PROTIME-INR
INR: 1 (ref 0.8–1.2)
Prothrombin Time: 12.9 seconds (ref 11.4–15.2)

## 2021-11-16 LAB — RESP PANEL BY RT-PCR (FLU A&B, COVID) ARPGX2
Influenza A by PCR: NEGATIVE
Influenza B by PCR: NEGATIVE
SARS Coronavirus 2 by RT PCR: NEGATIVE

## 2021-11-16 LAB — CBC
HCT: 41 % (ref 36.0–46.0)
Hemoglobin: 13.9 g/dL (ref 12.0–15.0)
MCH: 31.2 pg (ref 26.0–34.0)
MCHC: 33.9 g/dL (ref 30.0–36.0)
MCV: 92.1 fL (ref 80.0–100.0)
Platelets: 248 10*3/uL (ref 150–400)
RBC: 4.45 MIL/uL (ref 3.87–5.11)
RDW: 12.2 % (ref 11.5–15.5)
WBC: 6.5 10*3/uL (ref 4.0–10.5)
nRBC: 0 % (ref 0.0–0.2)

## 2021-11-16 LAB — APTT: aPTT: 26 seconds (ref 24–36)

## 2021-11-16 LAB — I-STAT CHEM 8, ED
BUN: 13 mg/dL (ref 8–23)
Calcium, Ion: 1.12 mmol/L — ABNORMAL LOW (ref 1.15–1.40)
Chloride: 105 mmol/L (ref 98–111)
Creatinine, Ser: 0.8 mg/dL (ref 0.44–1.00)
Glucose, Bld: 105 mg/dL — ABNORMAL HIGH (ref 70–99)
HCT: 41 % (ref 36.0–46.0)
Hemoglobin: 13.9 g/dL (ref 12.0–15.0)
Potassium: 3.5 mmol/L (ref 3.5–5.1)
Sodium: 138 mmol/L (ref 135–145)
TCO2: 23 mmol/L (ref 22–32)

## 2021-11-16 LAB — RAPID URINE DRUG SCREEN, HOSP PERFORMED
Amphetamines: NOT DETECTED
Barbiturates: NOT DETECTED
Benzodiazepines: NOT DETECTED
Cocaine: NOT DETECTED
Opiates: NOT DETECTED
Tetrahydrocannabinol: NOT DETECTED

## 2021-11-16 LAB — ETHANOL: Alcohol, Ethyl (B): <10 mg/dL (ref <10)

## 2021-11-16 MED ORDER — TRAZODONE HCL 50 MG PO TABS: 50.0000 mg | ORAL_TABLET | Freq: Every evening | ORAL | Status: DC | PRN

## 2021-11-16 MED ORDER — ACETAMINOPHEN 160 MG/5ML PO SOLN: 650.0000 mg | ORAL | Status: DC | PRN

## 2021-11-16 MED ORDER — ACETAMINOPHEN 650 MG RE SUPP: 650.0000 mg | RECTAL | Status: DC | PRN

## 2021-11-16 MED ORDER — ASPIRIN 300 MG RE SUPP: 300.0000 mg | Freq: Every day | RECTAL | Status: DC

## 2021-11-16 MED ORDER — ACETAMINOPHEN 325 MG PO TABS: 650.0000 mg | ORAL_TABLET | ORAL | Status: DC | PRN

## 2021-11-16 MED ORDER — ONDANSETRON HCL 4 MG/2ML IJ SOLN
4.0000 mg | Freq: Once | INTRAMUSCULAR | Status: AC
  Administered 2021-11-16: 4 mg via INTRAVENOUS
  Filled 2021-11-16: qty 2

## 2021-11-16 MED ORDER — ASPIRIN 81 MG PO CHEW
324.0000 mg | CHEWABLE_TABLET | Freq: Once | ORAL | Status: AC
  Administered 2021-11-16: 324 mg via ORAL
  Filled 2021-11-16: qty 4

## 2021-11-16 MED ORDER — STROKE: EARLY STAGES OF RECOVERY BOOK
Freq: Once | Status: AC
  Filled 2021-11-16: qty 1

## 2021-11-16 MED ORDER — BUSPIRONE HCL 5 MG PO TABS
5.0000 mg | ORAL_TABLET | Freq: Two times a day (BID) | ORAL | Status: DC
  Administered 2021-11-17: 5 mg via ORAL
  Filled 2021-11-16: qty 1

## 2021-11-16 MED ORDER — CLOPIDOGREL BISULFATE 75 MG PO TABS
300.0000 mg | ORAL_TABLET | Freq: Once | ORAL | Status: AC
  Administered 2021-11-16: 300 mg via ORAL
  Filled 2021-11-16: qty 4

## 2021-11-16 MED ORDER — ASPIRIN 325 MG PO TABS: 325.0000 mg | ORAL_TABLET | Freq: Every day | ORAL | Status: DC

## 2021-11-16 MED ORDER — PROCHLORPERAZINE EDISYLATE 10 MG/2ML IJ SOLN
10.0000 mg | Freq: Four times a day (QID) | INTRAMUSCULAR | Status: DC | PRN
  Administered 2021-11-16: 10 mg via INTRAVENOUS
  Filled 2021-11-16: qty 2

## 2021-11-16 MED ORDER — ASPIRIN 81 MG PO TBEC
81.0000 mg | DELAYED_RELEASE_TABLET | Freq: Every day | ORAL | Status: DC
  Administered 2021-11-17 – 2021-11-18 (×2): 81 mg via ORAL
  Filled 2021-11-16 (×2): qty 1

## 2021-11-16 MED ORDER — ONDANSETRON HCL 4 MG/2ML IJ SOLN
4.0000 mg | Freq: Four times a day (QID) | INTRAMUSCULAR | Status: DC | PRN
  Administered 2021-11-17 – 2021-11-18 (×2): 4 mg via INTRAVENOUS
  Filled 2021-11-16 (×2): qty 2

## 2021-11-16 MED ORDER — ENOXAPARIN SODIUM 40 MG/0.4ML IJ SOSY: 40.0000 mg | PREFILLED_SYRINGE | Freq: Every day | INTRAMUSCULAR | Status: DC

## 2021-11-16 MED ORDER — LORAZEPAM 2 MG/ML IJ SOLN
0.5000 mg | Freq: Once | INTRAMUSCULAR | Status: AC | PRN
  Administered 2021-11-16: 0.5 mg via INTRAVENOUS
  Filled 2021-11-16: qty 1

## 2021-11-16 MED ORDER — CLOPIDOGREL BISULFATE 75 MG PO TABS
75.0000 mg | ORAL_TABLET | Freq: Every day | ORAL | Status: DC
  Administered 2021-11-17 – 2021-11-18 (×2): 75 mg via ORAL
  Filled 2021-11-16 (×2): qty 1

## 2021-11-16 MED ORDER — SODIUM CHLORIDE 0.9 % IV SOLN: INTRAVENOUS | Status: DC

## 2021-11-16 MED ORDER — MELATONIN 5 MG PO TABS
5.0000 mg | ORAL_TABLET | Freq: Every day | ORAL | Status: DC
  Filled 2021-11-16: qty 1

## 2021-11-16 NOTE — ED Notes (Signed)
MRI called. Next for transport. Emesis x2 since 1500.

## 2021-11-16 NOTE — ED Notes (Signed)
ED TO INPATIENT HANDOFF REPORT  ED Nurse Name and Phone #:    S Name/Age/Gender [name] 76 y.o. female Room/Bed: 005C/005C  Code Status   Code Status: Prior  Home/SNF/Other Home Patient oriented to: self, place, time, and situation Is this baseline? Yes   Triage Complete: Triage complete  Chief Complaint Hypertensive urgency [I16.0]  Triage Note Pt arrives via [facility] From [facility] for possible stroke. Pt woke up at 0800 today feeling dizzy, pt also endorses getting up in the middle of the night to use the bathroom and was "slightly dizzy" then. At PCP pt has HTN 220/100, dizzy, had abnormal gait, and was ataxic w/ L arm, and L side HA. Pt has not been taking any prescriptions medications. Hx L meniscus tear 6 months ago, some "inner ear issues", HTN, TIAs.   Allergies Allergies  Allergen Reactions   Epinephrine    Contrast Media [Iodinated Contrast Media] Itching    Pt had itchy nose and dry throat and was told she was allergic to IV contrast and does not want to have it.  PT DENIES HAVING A CONTRAST ALLERGY   Keflex [Cephalexin] Swelling    Level of Care/Admitting Diagnosis ED Disposition     ED Disposition  Admit   Condition  --   Comment  Hospital Area: [facility] [[id number]]  Level of Care: Progressive [102]  Admit to Progressive based on following criteria: NEUROLOGICAL AND NEUROSURGICAL complex patients with significant risk of instability, who do not meet ICU criteria, yet require close observation or frequent assessment (< / = every 2 - 4 hours) with medical / nursing intervention.  May place patient in observation at [facility] or [place] if equivalent level of care is available:: No  Covid Evaluation: Asymptomatic - no recent exposure (last 10 days) testing not required  Diagnosis: Hypertensive urgency [id number]  Admitting Physician: Manfred Shirts  Attending Physician: Waldron Labs, DAWOOD S [[id number]]           B Medical/Surgery History Past Medical History:  Diagnosis Date   Anxiety    Arthritis    Balance problem    Depression    Double vision 10/2014   bilateral   Dyslipidemia    High blood pressure    Multiple thyroid nodules    OSA (obstructive sleep apnea)    Paroxysmal SVT (supraventricular tachycardia) (HCC)    PVC (premature ventricular contraction)    intermittent   Syncope and collapse    last friday had alot of dizziness   Past Surgical History:  Procedure Laterality Date   ABDOMINAL HYSTERECTOMY     catheter ablation     TONSILLECTOMY       A IV Location/Drains/Wounds Patient Lines/Drains/Airways Status     Active Line/Drains/Airways     Name Placement date Placement time Site Days   Peripheral IV 11/16/21 18 G Left Antecubital 11/16/21  --  Antecubital  less than 1            Intake/Output Last 24 hours No intake or output data in the 24 hours ending 11/16/21 1906  Labs/Imaging Results for orders placed or performed during the hospital encounter of 11/16/21 (from the past 48 hour(s))  Resp Panel by RT-PCR (Flu A&B, Covid) Nasopharyngeal Swab     Status: None   Collection Time: 11/16/21  3:00 PM   Specimen: Nasopharyngeal Swab; Nasopharyngeal(NP) swabs in vial transport medium  Result Value Ref Range   SARS Coronavirus 2 by RT PCR NEGATIVE  NEGATIVE    Comment: (NOTE) SARS-CoV-2 target nucleic acids are NOT DETECTED.  The SARS-CoV-2 RNA is generally detectable in upper respiratory specimens during the acute phase of infection. The lowest concentration of SARS-CoV-2 viral copies this assay can detect is 138 copies/mL. A negative result does not preclude SARS-Cov-2 infection and should not be used as the sole basis for treatment or other patient management decisions. A negative result may occur with  improper specimen collection/handling, submission of specimen other than nasopharyngeal swab, presence of viral mutation(s) within the areas  targeted by this assay, and inadequate number of viral copies(<138 copies/mL). A negative result must be combined with clinical observations, patient history, and epidemiological information. The expected result is Negative.  Fact Sheet for Patients:  EntrepreneurPulse.com.au  Fact Sheet for Healthcare Providers:  IncredibleEmployment.be  This test is no t yet approved or cleared by the [place] FDA and  has been authorized for detection and/or diagnosis of SARS-CoV-2 by FDA under an Emergency Use Authorization (EUA). This EUA will remain  in effect (meaning this test can be used) for the duration of the COVID-19 declaration under Section 564(b)(1) of the Act, 21 U.S.C.section 360bbb-3(b)(1), unless the authorization is terminated  or revoked sooner.       Influenza A by PCR NEGATIVE NEGATIVE   Influenza B by PCR NEGATIVE NEGATIVE    Comment: (NOTE) The Xpert Xpress SARS-CoV-2/FLU/RSV plus assay is intended as an aid in the diagnosis of influenza from Nasopharyngeal swab specimens and should not be used as a sole basis for treatment. Nasal washings and aspirates are unacceptable for Xpert Xpress SARS-CoV-2/FLU/RSV testing.  Fact Sheet for Patients: EntrepreneurPulse.com.au  Fact Sheet for Healthcare Providers: IncredibleEmployment.be  This test is not yet approved or cleared by the [place] FDA and has been authorized for detection and/or diagnosis of SARS-CoV-2 by FDA under an Emergency Use Authorization (EUA). This EUA will remain in effect (meaning this test can be used) for the duration of the COVID-19 declaration under Section 564(b)(1) of the Act, 21 U.S.C. section 360bbb-3(b)(1), unless the authorization is terminated or revoked.  Performed at [facility], [place] [address], [place], [place] [address]   Ethanol     Status: None   Collection Time: 11/16/21  3:13 PM  Result Value  Ref Range   Alcohol, Ethyl (B) <10 <10 mg/dL    Comment: (NOTE) Lowest detectable limit for serum alcohol is 10 mg/dL.  For medical purposes only. Performed at [facility], [place] [address] [place], [place] [address]   Protime-INR     Status: None   Collection Time: 11/16/21  3:13 PM  Result Value Ref Range   Prothrombin Time 12.9 11.4 - 15.2 seconds   INR 1.0 0.8 - 1.2    Comment: (NOTE) INR goal varies based on device and disease states. Performed at [facility], [place] [address], [place], [place] [address]   APTT     Status: None   Collection Time: 11/16/21  3:13 PM  Result Value Ref Range   aPTT 26 24 - 36 seconds    Comment: Performed at [facility] [address] [place] [address]  CBC     Status: None   Collection Time: 11/16/21  3:13 PM  Result Value Ref Range   WBC 6.5 4.0 - 10.5 K/uL   RBC 4.45 3.87 - 5.11 MIL/uL   Hemoglobin 13.9 12.0 - 15.0 g/dL   HCT 41.0 36.0 - 46.0 %  MCV 92.1 80.0 - 100.0 fL   MCH 31.2 26.0 - 34.0 pg   MCHC 33.9 30.0 - 36.0 g/dL   RDW 12.2 11.5 - 15.5 %   Platelets 248 150 - 400 K/uL   nRBC 0.0 0.0 - 0.2 %    Comment: Performed at [facility], [place] [address] [place], [place] [address]  Differential     Status: None   Collection Time: 11/16/21  3:13 PM  Result Value Ref Range   Neutrophils Relative % 78 %   Neutro Abs 5.1 1.7 - 7.7 K/uL   Lymphocytes Relative 12 %   Lymphs Abs 0.8 0.7 - 4.0 K/uL   Monocytes Relative 7 %   Monocytes Absolute 0.5 0.1 - 1.0 K/uL   Eosinophils Relative 1 %   Eosinophils Absolute 0.0 0.0 - 0.5 K/uL   Basophils Relative 1 %   Basophils Absolute 0.1 0.0 - 0.1 K/uL   Immature Granulocytes 1 %   Abs Immature Granulocytes 0.06 0.00 - 0.07 K/uL    Comment: Performed at [facility] [address], [place], [place] [address]  Comprehensive metabolic panel     Status: Abnormal   Collection Time: 11/16/21  3:13 PM  Result Value Ref Range   Sodium 138 135 - 145  mmol/L   Potassium 3.5 3.5 - 5.1 mmol/L   Chloride 104 98 - 111 mmol/L   CO2 23 22 - 32 mmol/L   Glucose, Bld 105 (H) 70 - 99 mg/dL    Comment: Glucose reference range applies only to samples taken after fasting for at least 8 hours.   BUN 9 8 - 23 mg/dL   Creatinine, Ser 0.91 0.44 - 1.00 mg/dL   Calcium 9.5 8.9 - 10.3 mg/dL   Total Protein 6.8 6.5 - 8.1 g/dL   Albumin 4.0 3.5 - 5.0 g/dL   AST 21 15 - 41 U/L   ALT 17 0 - 44 U/L   Alkaline Phosphatase 70 38 - 126 U/L   Total Bilirubin 0.7 0.3 - 1.2 mg/dL   GFR, Estimated >60 >60 mL/min    Comment: (NOTE) Calculated using the CKD-EPI Creatinine Equation (2021)    Anion gap 11 5 - 15    Comment: Performed at [facility] [address] [place], [place] [address]  I-stat chem 8, ED     Status: Abnormal   Collection Time: 11/16/21  3:41 PM  Result Value Ref Range   Sodium 138 135 - 145 mmol/L   Potassium 3.5 3.5 - 5.1 mmol/L   Chloride 105 98 - 111 mmol/L   BUN 13 8 - 23 mg/dL   Creatinine, Ser 0.80 0.44 - 1.00 mg/dL   Glucose, Bld 105 (H) 70 - 99 mg/dL    Comment: Glucose reference range applies only to samples taken after fasting for at least 8 hours.   Calcium, Ion 1.12 (L) 1.15 - 1.40 mmol/L   TCO2 23 22 - 32 mmol/L   Hemoglobin 13.9 12.0 - 15.0 g/dL   HCT 41.0 36.0 - 46.0 %  Urine rapid drug screen (hosp performed)     Status: None   Collection Time: 11/16/21  4:00 PM  Result Value Ref Range   Opiates NONE DETECTED NONE DETECTED   Cocaine NONE DETECTED NONE DETECTED   Benzodiazepines NONE DETECTED NONE DETECTED   Amphetamines NONE DETECTED NONE DETECTED   Tetrahydrocannabinol NONE DETECTED NONE DETECTED   Barbiturates NONE DETECTED NONE DETECTED    Comment: (NOTE)  DRUG SCREEN FOR MEDICAL PURPOSES ONLY.  IF CONFIRMATION IS NEEDED FOR ANY PURPOSE, NOTIFY LAB WITHIN 5 DAYS.  LOWEST DETECTABLE LIMITS FOR URINE DRUG SCREEN Drug Class                     Cutoff (ng/mL) Amphetamine and metabolites     1000 Barbiturate and metabolites    200 Benzodiazepine                 [id number] Tricyclics and metabolites     300 Opiates and metabolites        300 Cocaine and metabolites        300 THC                            50 Performed at [facility], [place] [address], [place], [place] [address]   Urinalysis, Routine w reflex microscopic Urine, Clean Catch     Status: Abnormal   Collection Time: 11/16/21  4:00 PM  Result Value Ref Range   Color, Urine YELLOW YELLOW   APPearance CLEAR CLEAR   Specific Gravity, Urine 1.012 1.005 - 1.030   pH 6.0 5.0 - 8.0   Glucose, UA NEGATIVE NEGATIVE mg/dL   Hgb urine dipstick NEGATIVE NEGATIVE   Bilirubin Urine NEGATIVE NEGATIVE   Ketones, ur 5 (A) NEGATIVE mg/dL   Protein, ur NEGATIVE NEGATIVE mg/dL   Nitrite NEGATIVE NEGATIVE   Leukocytes,Ua NEGATIVE NEGATIVE    Comment: Performed at [facility] [address], [place], [place] [address]   CT Head Wo Contrast  Result Date: 11/16/2021 CLINICAL DATA:  Acute neurological deficit with stroke suspected. EXAM: CT HEAD WITHOUT CONTRAST TECHNIQUE: Contiguous axial images were obtained from the base of the skull through the vertex without intravenous contrast. RADIATION DOSE REDUCTION: This exam was performed according to the departmental dose-optimization program which includes automated exposure control, adjustment of the mA and/or kV according to patient size and/or use of iterative reconstruction technique. COMPARISON:  CT 06/11/2015.  MRI 12/19/2019 FINDINGS: Brain: Mild diffuse cerebral atrophy. No ventricular dilatation. Multiple focal areas of low-attenuation demonstrated throughout the deep and periventricular white matter, in the left cerebellum, and in the pons. These likely represent old lacunar infarcts. These have progressed since the prior studies. No mass effect or midline shift. No abnormal extra-axial fluid collections. Gray-white matter junctions are distinct. Basal cisterns are not  effaced. No acute intracranial hemorrhage. Vascular: Intracranial arterial vascular calcifications are present. Skull: Calvarium appears intact. Sinuses/Orbits: Paranasal sinuses and mastoid air cells are clear. Other: None. 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 There is progression since previous study. No acute intracranial hemorrhage or mass effect. Electronically Signed   By: Lucienne Capers M.D.   On: 11/16/2021 16:48    Pending Labs Unresulted Labs (From admission, onward)    None       Vitals/Pain Today's Vitals   11/16/21 1745 11/16/21 1800 11/16/21 1815 11/16/21 1830  BP: (!) 204/80 (!) 203/83 (!) 189/74 (!) 235/100  Pulse: 82 81 79 88  Resp: 18 19 18 17   Temp:      TempSrc:      SpO2:  93% 96%   PainSc:        Isolation Precautions No active isolations  Medications Medications  prochlorperazine (COMPAZINE) injection 10 mg (10 mg Intravenous Given 11/16/21 1734)  aspirin chewable tablet 324 mg (  has no administration in time range)  ondansetron (ZOFRAN) injection 4 mg (4 mg Intravenous Given 11/16/21 1603)    Mobility walks Low fall risk   Focused Assessments Neuro Assessment Handoff:  Swallow screen pass? Yes    NIH Stroke Scale ( + Modified Stroke Scale Criteria)  Interval: Initial Level of Consciousness (1a.)   : Alert, keenly responsive LOC Questions (1b. )   +: Answers both questions correctly LOC Commands (1c. )   + : Performs both tasks correctly Best Gaze (2. )  +: Normal Visual (3. )  +: No visual loss Facial Palsy (4. )    : Normal symmetrical movements Motor Arm, Left (5a. )   +: No drift Motor Arm, Right (5b. )   +: No drift Motor Leg, Left (6a. )   +: No drift Motor Leg, Right (6b. )   +: No drift Limb Ataxia (7. ): Present in one limb Sensory (8. )   +: Mild-to-moderate sensory loss, patient feels pinprick is less sharp or is dull on the  affected side, or there is a loss of superficial pain with pinprick, but patient is aware of being touched Best Language (9. )   +: No aphasia Dysarthria (10. ): Normal Extinction/Inattention (11.)   +: No Abnormality Modified SS Total  +: 1 Complete NIHSS TOTAL: 2 Last date known well: 11/15/21 Last time known well: 2200 Neuro Assessment: Exceptions to WDL Neuro Checks:   Initial (11/16/21 1502)  Last Documented NIHSS Modified Score: 1 (11/16/21 1730) Has TPA been given? No If patient is a Neuro Trauma and patient is going to OR before floor call report to [place] nurse: [phone] or [phone]   R Recommendations: See Admitting Provider Note  Report given to:   Additional Notes:

## 2021-11-16 NOTE — ED Notes (Signed)
Patient to MRI at this time.

## 2021-11-16 NOTE — ED Provider Notes (Signed)
[facility] EMERGENCY DEPARTMENT Provider Note   CSN: [id number] Arrival date & time: 11/16/21  1450     History  Chief Complaint  Patient presents with   stroke like symptoms    [name] is a 76 y.o. female.  HPI Patient presents via EMS, accompanied by her husband who assists with history.  She notes that she does not take her blood pressure medication with any frequency.  However, she was in her usual state of health when she went to bed last night, but awoke today feeling dizzy.  At some point after feeling dizzy she developed left arm discoordination which compelled her to go to her physician.  Per EMS report the patient was found to be hypertensive, systolic greater than [id number] in her physician's office with left arm discoordination.  Patient was sent here for evaluation.  Patient denies speech difficulty, weakness in other extremities.    Home Medications Prior to Admission medications   Medication Sig Start Date End Date Taking? Authorizing Provider  amLODipine (NORVASC) 2.5 MG tablet Take 1 tablet (2.5 mg total) by mouth daily. Patient not taking: Reported on 06/18/2021 10/03/20   Lajean Saver, MD  aspirin EC 81 MG tablet Take 81 mg by mouth as needed. Swallow whole.    [provider]  Cholecalciferol (VITAMIN D3) 2000 UNITS TABS Take 2,000 Units by mouth daily. Patient not taking: Reported on 06/18/2021    [provider]  diazepam (VALIUM) 5 MG tablet Take 5 mg by mouth every 8 (eight) hours as needed (dizziness). Patient not taking: Reported on 06/18/2021    [provider]  meclizine (ANTIVERT) 12.5 MG tablet Take 1 tablet (12.5 mg total) by mouth 3 (three) times daily as needed for dizziness. Patient not taking: Reported on 06/18/2021 11/18/14   Palumbo, April, MD  metoprolol tartrate (LOPRESSOR) 100 MG tablet TAKE 1 TABLET BY MOUTH ONCE FOR 1 DOSE 2 HOURS BEFORE YOUR CT SCAN 06/22/21   Jerline Pain, MD  ondansetron  (ZOFRAN) 4 MG tablet TAKE 1 TABLET (4 MG TOTAL) BY MOUTH EVERY 8 (EIGHT) HOURS AS NEEDED FOR NAUSEA. Patient not taking: Reported on 06/18/2021 12/31/14   [provider]  predniSONE (DELTASONE) 50 MG tablet Take 1 tab 13 hrs before, one 7 hrs before and one 1 hr before your CT scan 06/18/21   Jerline Pain, MD      Allergies    Epinephrine, Contrast media [iodinated contrast media], and Keflex [cephalexin]    Review of Systems   Review of Systems  All other systems reviewed and are negative.  Physical Exam Updated Vital Signs BP (!) 220/94 (BP Location: Right Arm)   Pulse 80   Temp 98 F (36.7 C) (Oral)   Resp 20   SpO2 98%  Physical Exam Vitals and nursing note reviewed.  Constitutional:      General: She is not in acute distress.    Appearance: She is well-developed.  HENT:     Head: Normocephalic and atraumatic.  Eyes:     Conjunctiva/sclera: Conjunctivae normal.  Cardiovascular:     Rate and Rhythm: Normal rate and regular rhythm.  Pulmonary:     Effort: Pulmonary effort is normal. No respiratory distress.     Breath sounds: Normal breath sounds. No stridor.  Abdominal:     General: There is no distension.  Skin:    General: Skin is warm and dry.  Neurological:     Mental Status: She is alert and  oriented to person, place, and time.     Cranial Nerves: No cranial nerve deficit or dysarthria.     Motor: No seizure activity.     Coordination: Coordination abnormal.     Comments: Dysmetria, left-sided  Psychiatric:        Mood and Affect: Mood normal.    ED Results / Procedures / Treatments   Labs (all labs ordered are listed, but only abnormal results are displayed) Labs Reviewed  RESP PANEL BY RT-PCR (FLU A&B, COVID) [id number]  ETHANOL  PROTIME-INR  APTT  CBC  DIFFERENTIAL  COMPREHENSIVE METABOLIC PANEL  RAPID URINE DRUG SCREEN, HOSP PERFORMED  URINALYSIS, ROUTINE W REFLEX MICROSCOPIC  I-STAT CHEM 8, ED    EKG None  Radiology No results  found.  Procedures Procedures    Medications Ordered in ED Medications - No data to display  ED Course/ Medical Decision Making/ A&P This patient with a Hx of hypertension presents to the ED for concern of new discoordination, dizziness and hypertensive urgency, this involves an extensive number of treatment options, and is a complaint that carries with it a high risk of complications and morbidity.    The differential diagnosis includes hypertensive emergency, stroke, intracranial hemorrhage stenosis   Social Determinants of Health:  Age  Additional history obtained:  Additional history and/or information obtained from EMS and husband, notable for as above, additional details of HPI   After the initial evaluation, orders, including: Labs CT neurology consult, internal medicine consult were initiated.   Patient placed on Cardiac and Pulse-Oximetry Monitors. The patient was maintained on a cardiac monitor.  The cardiac monitored showed an rhythm of 80 sinus normal The patient was also maintained on pulse oximetry. The readings were typically 100% room air normal   On repeat evaluation of the patient improved 4:31 PM Blood pressure now 0000000 systolic, down [id number] from arrival.  Patient calm, no new complaints, awaiting imaging Lab Tests:  I personally interpreted labs.  The pertinent results include: Unremarkable labs   Consultations Obtained:  I requested consultation with the neurology,  and discussed lab and imaging findings as well as pertinent plan - they recommend: CT, MRI, admission  Dispostion / Final MDM:  After consideration of the diagnostic results and the patient's response to treatment, initial results, labs, reassuring, but the patient is awaiting neuroimaging studies, CT and MRI.  A question about the patient's contrast allergy delayed these studies somewhat, and on signout they have not yet been performed. This adult female presents with new dizziness, left  arm discoordination, no dysarthria, facial asymmetry.  Given hypertensive urgency on arrival there are some suspicion for CVA, patient likely will require admission, and on signout is awaiting additional studies, repeat evaluation, neuro consult.  Dr. Armandina Gemma is aware.   Final Clinical Impression(s) / ED Diagnoses Final diagnoses:  Hypertensive urgency  Discoordination     Carmin Muskrat, MD 11/16/21 [phone]

## 2021-11-16 NOTE — ED Triage Notes (Signed)
Pt arrives via [facility] From [facility] for possible stroke. Pt woke up at 0800 today feeling dizzy, pt also endorses getting up in the middle of the night to use the bathroom and was "slightly dizzy" then. At PCP pt has HTN 220/100, dizzy, had abnormal gait, and was ataxic w/ L arm, and L side HA. Pt has not been taking any prescriptions medications. Hx L meniscus tear 6 months ago, some "inner ear issues", HTN, TIAs.

## 2021-11-16 NOTE — H&P (Addendum)
TRH H&P   Patient Demographics:    [name], is a 76 y.o. female  MRN: [id number]   DOB - Sep 22, 1945  Admit Date - 11/16/2021  Outpatient Primary MD for the patient is Seward Carol, MD  Referring MD/NP/PA: Dr Armandina Gemma  Patient coming from: PCP office  Chief Complaint  Patient presents with   stroke like symptoms      HPI:    [name]  is a 76 y.o. female, history of hypertension, not on any medications currently, hyperlipidemia, vertigo, patient with previous history of TIA in 2016, with her MRA head significant for few areas of focal stenosis, she is not taking any home medications currently, it was sent by her PCP office for evaluation for uncontrolled pressure, patient reports she slept at her usual state of health, reports when she woke up this morning she had some dizziness, unsteady gait, and felt left arm discoordination, she had planned go to the dentist earlier today for crown teeth that she had root canal done recently, she canceled that appointment and she went to her PCP, she was noticed at PCP office to have elevated blood pressure 220/110, she was noted to have unsteady gait with left hand coordination, which prompted them to bring her by EMS to ED for stroke work-up, patient denies any headache, she does report left-sided tingling, numbness, discoordination, as well she had nausea and vomiting few times while in ED. -In ED CT head with no acute findings, was significant for chronic left lacunar cerebral infarct, blood pressure was elevated 215/115, she did not receive any medication until CVA is ruled out, she was ordered full dose aspirin in ED, reports her symptoms has improved but did not totally resolve.    Review of systems:     A full 10 point Review of Systems was done, except as stated above, all other Review of Systems were negative.   With  Past History of the following :    Past Medical History:  Diagnosis Date   Anxiety    Arthritis    Balance problem    Depression    Double vision 10/2014   bilateral   Dyslipidemia    High blood pressure    Multiple thyroid nodules    OSA (obstructive sleep apnea)    Paroxysmal SVT (supraventricular tachycardia) (HCC)    PVC (premature ventricular contraction)    intermittent   Syncope and collapse    last friday had alot of dizziness      Past Surgical History:  Procedure Laterality Date   ABDOMINAL HYSTERECTOMY     catheter ablation     TONSILLECTOMY        Social History:     Social History   Tobacco Use   Smoking status: Former    Packs/day: 0.00    Years: 20.00    Pack years: 0.00    Types: Cigarettes  Quit date: 12/11/1984    Years since quitting: 36.9   Smokeless tobacco: Never   Tobacco comments:    Quit 40 years ago  Substance Use Topics   Alcohol use: No     Lives -at home with husband  Mobility -independent    Family History :     Family History  Problem Relation Age of Onset   High blood pressure Father    Stroke Father 29   CAD Father 84       CABG.  Lived to be 63   Arthritis Mother    Neuropathy Mother       Home Medications:   Prior to Admission medications   Medication Sig Start Date End Date Taking? Authorizing Provider  amLODipine (NORVASC) 2.5 MG tablet Take 1 tablet (2.5 mg total) by mouth daily. Patient not taking: Reported on 06/18/2021 10/03/20   Lajean Saver, MD  aspirin EC 81 MG tablet Take 81 mg by mouth as needed. Swallow whole.    [provider]  Cholecalciferol (VITAMIN D3) 2000 UNITS TABS Take 2,000 Units by mouth daily. Patient not taking: Reported on 06/18/2021    [provider]  diazepam (VALIUM) 5 MG tablet Take 5 mg by mouth every 8 (eight) hours as needed (dizziness). Patient not taking: Reported on 06/18/2021    [provider]  meclizine (ANTIVERT) 12.5 MG tablet Take 1  tablet (12.5 mg total) by mouth 3 (three) times daily as needed for dizziness. Patient not taking: Reported on 06/18/2021 11/18/14   Palumbo, April, MD  metoprolol tartrate (LOPRESSOR) 100 MG tablet TAKE 1 TABLET BY MOUTH ONCE FOR 1 DOSE 2 HOURS BEFORE YOUR CT SCAN 06/22/21   Jerline Pain, MD  ondansetron (ZOFRAN) 4 MG tablet TAKE 1 TABLET (4 MG TOTAL) BY MOUTH EVERY 8 (EIGHT) HOURS AS NEEDED FOR NAUSEA. Patient not taking: Reported on 06/18/2021 12/31/14   [provider]  predniSONE (DELTASONE) 50 MG tablet Take 1 tab 13 hrs before, one 7 hrs before and one 1 hr before your CT scan 06/18/21   Jerline Pain, MD     Allergies:     Allergies  Allergen Reactions   Epinephrine    Contrast Media [Iodinated Contrast Media] Itching    Pt had itchy nose and dry throat and was told she was allergic to IV contrast and does not want to have it.  PT DENIES HAVING A CONTRAST ALLERGY   Keflex [Cephalexin] Swelling     Physical Exam:   Vitals  Blood pressure (!) 235/100, pulse 88, temperature 98 F (36.7 C), temperature source Oral, resp. rate 17, SpO2 96 %.   1. General well-developed female, laying in bed, in mild discomfort due to nausea, anxious  2. Normal affect and insight, Not Suicidal or Homicidal, Awake Alert, Oriented X 3.  Anxious  3. No F.N deficits, ALL C.Nerves Intact, Strength 5/5 all 4 extremities, Sensation intact all 4 extremities, Plantars down going.  He is with unsteady gait, abnormal left finger-to-nose test  4. Ears and Eyes appear Normal, Conjunctivae clear, PERRLA. Moist Oral Mucosa.  5. Supple Neck, No JVD, No cervical lymphadenopathy appriciated, No Carotid Bruits.  6. Symmetrical Chest wall movement, Good air movement bilaterally, CTAB.  7. RRR, No Gallops, Rubs or Murmurs, No Parasternal Heave.  8. Positive Bowel Sounds, Abdomen Soft, No tenderness, No organomegaly appriciated,No rebound -guarding or rigidity.  9.  No Cyanosis, Normal Skin Turgor,  No Skin Rash or Bruise.  10. Good muscle tone,  joints appear normal , no effusions, Normal ROM.  11. No Palpable Lymph Nodes in Neck or Axillae    Data Review:    CBC Recent Labs  Lab 11/16/21 1513 11/16/21 1541  WBC 6.5  --   HGB 13.9 13.9  HCT 41.0 41.0  PLT 248  --   MCV 92.1  --   MCH 31.2  --   MCHC 33.9  --   RDW 12.2  --   LYMPHSABS 0.8  --   MONOABS 0.5  --   EOSABS 0.0  --   BASOSABS 0.1  --    ------------------------------------------------------------------------------------------------------------------  Chemistries  Recent Labs  Lab 11/16/21 1513 11/16/21 1541  NA 138 138  K 3.5 3.5  CL 104 105  CO2 23  --   GLUCOSE 105* 105*  BUN 9 13  CREATININE 0.91 0.80  CALCIUM 9.5  --   AST 21  --   ALT 17  --   ALKPHOS 70  --   BILITOT 0.7  --    ------------------------------------------------------------------------------------------------------------------ CrCl cannot be calculated (Unknown ideal weight.). ------------------------------------------------------------------------------------------------------------------ No results for input(s): TSH, T4TOTAL, T3FREE, THYROIDAB in the last 72 hours.  Invalid input(s): FREET3  Coagulation profile Recent Labs  Lab 11/16/21 1513  INR 1.0   ------------------------------------------------------------------------------------------------------------------- No results for input(s): DDIMER in the last 72 hours. -------------------------------------------------------------------------------------------------------------------  Cardiac Enzymes No results for input(s): CKMB, TROPONINI, MYOGLOBIN in the last 168 hours.  Invalid input(s): CK ------------------------------------------------------------------------------------------------------------------ No results found for:  BNP   ---------------------------------------------------------------------------------------------------------------  Urinalysis    Component Value Date/Time   COLORURINE YELLOW 11/16/2021 1600   APPEARANCEUR CLEAR 11/16/2021 1600   LABSPEC 1.012 11/16/2021 1600   PHURINE 6.0 11/16/2021 1600   GLUCOSEU NEGATIVE 11/16/2021 1600   HGBUR NEGATIVE 11/16/2021 1600   BILIRUBINUR NEGATIVE 11/16/2021 1600   KETONESUR 5 (A) 11/16/2021 1600   PROTEINUR NEGATIVE 11/16/2021 1600   UROBILINOGEN 0.2 11/18/2014 0115   NITRITE NEGATIVE 11/16/2021 1600   LEUKOCYTESUR NEGATIVE 11/16/2021 1600    ----------------------------------------------------------------------------------------------------------------   Imaging Results:    CT Head Wo Contrast  Result Date: 11/16/2021 CLINICAL DATA:  Acute neurological deficit with stroke suspected. EXAM: CT HEAD WITHOUT CONTRAST TECHNIQUE: Contiguous axial images were obtained from the base of the skull through the vertex without intravenous contrast. RADIATION DOSE REDUCTION: This exam was performed according to the departmental dose-optimization program which includes automated exposure control, adjustment of the mA and/or kV according to patient size and/or use of iterative reconstruction technique. COMPARISON:  CT 06/11/2015.  MRI 12/19/2019 FINDINGS: Brain: Mild diffuse cerebral atrophy. No ventricular dilatation. Multiple focal areas of low-attenuation demonstrated throughout the deep and periventricular white matter, in the left cerebellum, and in the pons. These likely represent old lacunar infarcts. These have progressed since the prior studies. No mass effect or midline shift. No abnormal extra-axial fluid collections. Gray-white matter junctions are distinct. Basal cisterns are not effaced. No acute intracranial hemorrhage. Vascular: Intracranial arterial vascular calcifications are present. Skull: Calvarium appears intact. Sinuses/Orbits: Paranasal  sinuses and mastoid air cells are clear. Other: None. 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 There is progression since previous study. No acute intracranial hemorrhage or mass effect. Electronically Signed   By: Burman Nieves M.D.   On: 11/16/2021 16:48    My personal review of EKG:  Vent. rate 72 BPM PR interval 138 ms QRS duration 79 ms QT/QTcB 404/443 ms P-R-T axes 65 2 119 Sinus rhythm Probable  left atrial enlargement LVH with secondary repolarization abnormality   Assessment & Plan:    Principal Problem:   Hypertensive urgency Active Problems:   Dizziness and giddiness   Essential hypertension   History of CVA (cerebrovascular accident)   Mixed hyperlipidemia   Paroxysmal atrial tachycardia (HCC)   Focal neurological deficit   Hypertensive urgency - significantly elevated, up to 235/110 -Serial for mild hypertension in the past, not on any home medications currently -We will await till MRI is done to rule out acute CVA, if positive for CVA will allow for permissive hypertension, otherwise will start on as needed hydralazine and Norvasc target systolic blood pressure 123456  Focal neurological deficits -Unsteady gait, left hand coordination, concern for acute CVA -she is with history of CVA in the past as evident on the CT head(patient did not know that, thought she had TIA), as well history of small vessel disease on previous MRA in 2016, high risk for acute CVA,. -Admitted under CVA pathway, will give full dose aspirin currently. -We will check lipid panel, A1c, start statin if elevated. -MRI head, MRA head and neck is ordered -Will obtain 2D echo. -Continue to monitor on telemetry -Neurology was consulted by ED, will await for further recommendation, likely Plavix need to be added with her known history of multivessel disease.  History of Mnire's disease/unsteady  gait/vertigo -Drug work-up is negative, then she will need vestibular PT evaluation  Nausea and vomiting -Can be attributed to uncontrolled blood pressure, versus cerebellar CVA, will await MRI findings, meanwhile continue with as needed Zofran -She did pass bedside swallow evaluation, I will keep her on a clear liquid diet and advance if her nausea and vomiting is improved  History of hyperlipidemia -We will check lipid panel, if elevated will start on statin  Depression/anxiety -as discussed with patient, husband at bedside, patient with some depressed mood recently, as well she is with some significant anxiety, so I will start on low-dose BuSpar should be helpful for her   DVT Prophylaxis  Lovenox  AM Labs Ordered, also please review Full Orders  Family Communication: Admission, patients condition and plan of care including tests being ordered have been discussed with the patient and husband at bedside who indicate understanding and agree with the plan and Code Status.  Code Status full code  Likely DC to home  Condition GUARDED    Consults called: Neurology by ED  Admission status: Observation  Time spent in minutes : 65 minutes   Phillips Climes M.D on 11/16/2021 at 7:19 PM   [facility] - Office  [phone]

## 2021-11-16 NOTE — ED Provider Notes (Signed)
  Physical Exam  BP (!) 208/91   Pulse 70   Temp 98 F (36.7 C) (Oral)   Resp 12   SpO2 99%   Physical Exam  Procedures  Procedures  ED Course / MDM   Clinical Course as of 11/16/21 1853  Tue Nov 16, 2021  1838 BP(!): 220/94 [JL]    Clinical Course User Index [JL] Regan Lemming, MD   Medical Decision Making Amount and/or Complexity of Data Reviewed Labs: ordered. Radiology: ordered.  Risk Prescription drug management. Decision regarding hospitalization.   55F, known hypertensive, not on meds not compliant, presenting with stroke like symptoms, not in the tPA window, CT Head pending, neuro consulted. BP 220s on arrival, now 0000000 systolic without intervention. Plan for admission.  CT head: IMPRESSION:  Multiple old appearing lacunar infarcts demonstrated throughout the  deep and periventricular white matter, pons, and cerebellum. There  is progression since previous study. No acute intracranial  hemorrhage or mass effect.   MRI brain ordered and pending.  Discussed the case with on-call neurology who will be following along.  Dr. Waldron Labs was consulted of hospitalist medicine who ultimately accepted the patient in admission. MRI pending at time of admission.       Regan Lemming, MD 11/16/21 [phone]

## 2021-11-16 NOTE — Consult Note (Signed)
Neurology Consultation Reason for Consult: LUE discoordination, dizziness  Requesting Physician: Phillips Climes  CC: Dizziness and discoordination  History is obtained from: Patient and chart review  HPI: [name] is a 76 y.o. female with past medical history significant for hypertension, hyperlipidemia, prediabetes, medication nonadherence, SVT, anxiety/depression, Mnire's disease, remote smoking history  She was in her usual state of health when she went to bed on 5/20 2 in the evening.  She noted that she was dizzy when she woke up at 2 AM and initially attributed this to Mnire's disease, though she notes that she has not had an attack in the past 6 years.  Later in the morning she noted that she was also having some difficulty controlling the movements of her left arm for which she presented to her primary care physician and was found to have uncontrolled blood pressures 220s over 100s.  She additionally had some emesis and headache in the left side of the head behind the left eye.  She has some hesitation to take medications as she prefers natural approaches and her father had rhabdomyolysis associated with statin use.  She is particularly concerned about the possibility of medications causing he reports.  Otherwise on review of systems she also notes some diarrhea for the last few months and a cold about a month ago, both of which have been improved recently.  She denies any blood in her stool or urine   LKW: 5/21 evening tPA given?: No, out of the window Premorbid modified rankin scale:      1 - No significant disability. Able to carry out all usual activities, despite some symptoms.  (Uses a walker at night due to left knee pain)  ROS: All other review of systems was negative except as noted in the HPI.   Past Medical History:  Diagnosis Date   Anxiety    Arthritis    Balance problem    Depression    Double vision 10/2014   bilateral   Dyslipidemia    High blood  pressure    Multiple thyroid nodules    OSA (obstructive sleep apnea)    Paroxysmal SVT (supraventricular tachycardia) (HCC)    PVC (premature ventricular contraction)    intermittent   Syncope and collapse    last friday had alot of dizziness   Family History  Problem Relation Age of Onset   High blood pressure Father    Stroke Father 62   CAD Father 109       CABG.  Lived to be 74   Arthritis Mother    Neuropathy Mother    Social History:  reports that she quit smoking about 36 years ago. Her smoking use included cigarettes. She has never used smokeless tobacco. She reports that she does not drink alcohol and does not use drugs.   Exam: Current vital signs: BP (!) 173/75 (BP Location: Left Arm)   Pulse 75   Temp 98 F (36.7 C) (Oral)   Resp 18   SpO2 94%  Vital signs in last 24 hours: Temp:  [98 F (36.7 C)] 98 F (36.7 C) (05/23 2155) Pulse Rate:  [69-88] 75 (05/23 2155) Resp:  [10-20] 18 (05/23 2155) BP: (173-235)/(74-107) 173/75 (05/23 2155) SpO2:  [93 %-99 %] 94 % (05/23 2155)   Physical Exam  Constitutional: Appears well-developed and well-nourished.  Psych: calm and cooperative, somewhat flat affect, mildly anxious Eyes: No scleral injection HENT: No oropharyngeal obstruction.  MSK: no joint deformities.  Cardiovascular: Perfusing extremities  well Respiratory: Effort normal, non-labored breathing GI: Soft.  No distension. There is no tenderness.  Skin: Warm dry and intact visible skin  Neuro: Mental Status: Patient is awake, alert, oriented to person, place, month, year, and situation. Patient is able to give a clear and coherent history. No signs of aphasia or neglect Cranial Nerves: II: Visual Fields are full. Pupils are equal, round, and reactive to light.   III,IV, VI: EOMI without ptosis or diploplia.  Saccadic pursuits V: Facial sensation is symmetric to light touch VII: Facial movement is symmetric.  VIII: hearing is intact to voice X: Uvula  elevates symmetrically XI: Shoulder shrug is symmetric. XII: tongue is midline without atrophy or fasciculations.  Motor: Tone is normal. Bulk is normal. 5/5 strength was present in all four extremities.  Sensory: Sensation is symmetric to light touch in the arms and legs. Deep Tendon Reflexes: 2+ and symmetric in the biceps, 2+ in the right patella, pain limited in the left patella (patient jerked with attempted test) .  Cerebellar: Significant discoordination of the left upper extremity on finger-nose testing, otherwise finger-nose and heel-to-shin are intact  Gait:  Deferred   NIHSS total 1 Score breakdown: One-point for left upper extremity ataxia Performed at 10:30 PM    I have reviewed labs in epic and the results pertinent to this consultation are:  Basic Metabolic Panel: Recent Labs  Lab 11/16/21 1513 11/16/21 1541  NA 138 138  K 3.5 3.5  CL 104 105  CO2 23  --   GLUCOSE 105* 105*  BUN 9 13  CREATININE 0.91 0.80  CALCIUM 9.5  --     CBC: Recent Labs  Lab 11/16/21 1513 11/16/21 1541  WBC 6.5  --   NEUTROABS 5.1  --   HGB 13.9 13.9  HCT 41.0 41.0  MCV 92.1  --   PLT 248  --     Coagulation Studies: Recent Labs    11/16/21 1513  LABPROT 12.9  INR 1.0    Unresulted Labs (From admission, onward)     Start     Ordered   Signed and Held  Lipid panel  (Labs)  Tomorrow morning,   R       Comments: Fasting    Signed and Held   Signed and Held  Hemoglobin A1c  (Labs)  Once,   R       Comments: To assess prior glycemic control    Signed and Held   Signed and Held  Comprehensive metabolic panel  (Labs)  Tomorrow morning,   R        Signed and Held   Signed and Held  CBC  (Labs)  Tomorrow morning,   R        Signed and Held             I have reviewed the images obtained personally and at bedside with patient and husband:  MRI brain and MRA head and neck personally reviewed, agree with radiology: 1. Acute or subacute infarcts in the PICA  territory in the left mid and inferior cerebellum, with loss of signal in the left PICA just distal to its takeoff, which is new compared to 2016 and concerning for occlusion. No evidence of hemorrhage, mass effect, or midline shift. 2. Progressive intracranial atherosclerotic disease compared to the prior exam, with moderate to severe stenosis in the distal right ICA and moderate multifocal narrowing in the right M1 and M2 branches. Other previously noted stenoses are overall unchanged.  3.  No hemodynamically significant stenosis in the neck.   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 in the setting of medication nonadherence  Recommendations: # Left PICA stroke, likely atheroembolic - Stroke labs 123456, fasting lipid panel - MRI brain already completed as above - MRA of the brain without contrast and MRA neck w/wo already completed as above - Frequent neuro checks - Echocardiogram - Prophylactic therapy-Antiplatelet med: Aspirin - dose 325mg  PO or 300mg  PR, followed by 81 mg daily - Plavix 300 mg load with 75 mg daily for 21 - 90 day course  - Would recommend statin if LDL is above goal of 70, but patient will likely benefit from starting at a low dose to reduce risk of side effects and encouraged adherence - Risk factor modification, medication adherence, diet, exercise discussed with patient - Telemetry monitoring;  - Blood pressure goal   - Permissive hypertension to 220/120 overnight, may consider gradually starting to normalize tomorrow pending clinical course - PT consult, OT consult - Stroke team to follow  Lesleigh Noe MD-PhD [facility] [phone] Available 7 PM to 7 AM, outside of these hours please call Neurologist on call as listed on Amion.

## 2021-11-17 ENCOUNTER — Observation Stay (HOSPITAL_COMMUNITY): Payer: Medicare Other

## 2021-11-17 ENCOUNTER — Other Ambulatory Visit: Payer: Self-pay | Admitting: Cardiology

## 2021-11-17 DIAGNOSIS — I6389 Other cerebral infarction: Secondary | ICD-10-CM

## 2021-11-17 DIAGNOSIS — R270 Ataxia, unspecified: Secondary | ICD-10-CM | POA: Diagnosis present

## 2021-11-17 DIAGNOSIS — R112 Nausea with vomiting, unspecified: Secondary | ICD-10-CM | POA: Diagnosis present

## 2021-11-17 DIAGNOSIS — Z91041 Radiographic dye allergy status: Secondary | ICD-10-CM | POA: Diagnosis not present

## 2021-11-17 DIAGNOSIS — Z8249 Family history of ischemic heart disease and other diseases of the circulatory system: Secondary | ICD-10-CM | POA: Diagnosis not present

## 2021-11-17 DIAGNOSIS — Z90710 Acquired absence of both cervix and uterus: Secondary | ICD-10-CM | POA: Diagnosis not present

## 2021-11-17 DIAGNOSIS — I480 Paroxysmal atrial fibrillation: Secondary | ICD-10-CM

## 2021-11-17 DIAGNOSIS — R29701 NIHSS score 1: Secondary | ICD-10-CM | POA: Diagnosis present

## 2021-11-17 DIAGNOSIS — I639 Cerebral infarction, unspecified: Secondary | ICD-10-CM | POA: Diagnosis present

## 2021-11-17 DIAGNOSIS — G459 Transient cerebral ischemic attack, unspecified: Secondary | ICD-10-CM

## 2021-11-17 DIAGNOSIS — Z20822 Contact with and (suspected) exposure to covid-19: Secondary | ICD-10-CM | POA: Diagnosis present

## 2021-11-17 DIAGNOSIS — R279 Unspecified lack of coordination: Secondary | ICD-10-CM | POA: Diagnosis present

## 2021-11-17 DIAGNOSIS — Z8261 Family history of arthritis: Secondary | ICD-10-CM | POA: Diagnosis not present

## 2021-11-17 DIAGNOSIS — Z8673 Personal history of transient ischemic attack (TIA), and cerebral infarction without residual deficits: Secondary | ICD-10-CM | POA: Diagnosis not present

## 2021-11-17 DIAGNOSIS — Z881 Allergy status to other antibiotic agents status: Secondary | ICD-10-CM | POA: Diagnosis not present

## 2021-11-17 DIAGNOSIS — Z823 Family history of stroke: Secondary | ICD-10-CM | POA: Diagnosis not present

## 2021-11-17 DIAGNOSIS — R42 Dizziness and giddiness: Secondary | ICD-10-CM

## 2021-11-17 DIAGNOSIS — Z888 Allergy status to other drugs, medicaments and biological substances status: Secondary | ICD-10-CM | POA: Diagnosis not present

## 2021-11-17 DIAGNOSIS — F419 Anxiety disorder, unspecified: Secondary | ICD-10-CM | POA: Diagnosis present

## 2021-11-17 DIAGNOSIS — I63542 Cerebral infarction due to unspecified occlusion or stenosis of left cerebellar artery: Secondary | ICD-10-CM | POA: Diagnosis present

## 2021-11-17 DIAGNOSIS — M1990 Unspecified osteoarthritis, unspecified site: Secondary | ICD-10-CM | POA: Diagnosis present

## 2021-11-17 DIAGNOSIS — E782 Mixed hyperlipidemia: Secondary | ICD-10-CM

## 2021-11-17 DIAGNOSIS — Z79899 Other long term (current) drug therapy: Secondary | ICD-10-CM | POA: Diagnosis not present

## 2021-11-17 DIAGNOSIS — G4733 Obstructive sleep apnea (adult) (pediatric): Secondary | ICD-10-CM | POA: Diagnosis present

## 2021-11-17 DIAGNOSIS — F32A Depression, unspecified: Secondary | ICD-10-CM | POA: Diagnosis present

## 2021-11-17 DIAGNOSIS — I672 Cerebral atherosclerosis: Secondary | ICD-10-CM | POA: Diagnosis present

## 2021-11-17 DIAGNOSIS — I160 Hypertensive urgency: Secondary | ICD-10-CM | POA: Diagnosis present

## 2021-11-17 DIAGNOSIS — Z87891 Personal history of nicotine dependence: Secondary | ICD-10-CM | POA: Diagnosis not present

## 2021-11-17 DIAGNOSIS — Z7982 Long term (current) use of aspirin: Secondary | ICD-10-CM | POA: Diagnosis not present

## 2021-11-17 DIAGNOSIS — I471 Supraventricular tachycardia: Secondary | ICD-10-CM | POA: Diagnosis present

## 2021-11-17 LAB — ECHOCARDIOGRAM COMPLETE
AR max vel: 2.14 cm2
AV Area VTI: 2.03 cm2
AV Area mean vel: 2.05 cm2
AV Mean grad: 6 mmHg
AV Peak grad: 11.6 mmHg
Ao pk vel: 1.7 m/s
Area-P 1/2: 3.65 cm2
Calc EF: 67 %
Height: 60 in
P 1/2 time: 572 msec
S' Lateral: 2.6 cm
Single Plane A2C EF: 64.3 %
Single Plane A4C EF: 69.8 %
Weight: 2320 oz

## 2021-11-17 LAB — COMPREHENSIVE METABOLIC PANEL
ALT: 18 U/L (ref 0–44)
AST: 19 U/L (ref 15–41)
Albumin: 3.6 g/dL (ref 3.5–5.0)
Alkaline Phosphatase: 66 U/L (ref 38–126)
Anion gap: 7 (ref 5–15)
BUN: 12 mg/dL (ref 8–23)
CO2: 24 mmol/L (ref 22–32)
Calcium: 9.3 mg/dL (ref 8.9–10.3)
Chloride: 107 mmol/L (ref 98–111)
Creatinine, Ser: 0.97 mg/dL (ref 0.44–1.00)
GFR, Estimated: >60 mL/min (ref >60)
Glucose, Bld: 123 mg/dL — ABNORMAL HIGH (ref 70–99)
Potassium: 3.7 mmol/L (ref 3.5–5.1)
Sodium: 138 mmol/L (ref 135–145)
Total Bilirubin: 0.7 mg/dL (ref 0.3–1.2)
Total Protein: 6.3 g/dL — ABNORMAL LOW (ref 6.5–8.1)

## 2021-11-17 LAB — HEMOGLOBIN A1C
Hgb A1c MFr Bld: 6.3 % — ABNORMAL HIGH (ref 4.8–5.6)
Mean Plasma Glucose: 134.11 mg/dL

## 2021-11-17 LAB — CBC
HCT: 37.8 % (ref 36.0–46.0)
Hemoglobin: 13 g/dL (ref 12.0–15.0)
MCH: 30.9 pg (ref 26.0–34.0)
MCHC: 34.4 g/dL (ref 30.0–36.0)
MCV: 89.8 fL (ref 80.0–100.0)
Platelets: 220 10*3/uL (ref 150–400)
RBC: 4.21 MIL/uL (ref 3.87–5.11)
RDW: 12.2 % (ref 11.5–15.5)
WBC: 7.3 10*3/uL (ref 4.0–10.5)
nRBC: 0 % (ref 0.0–0.2)

## 2021-11-17 LAB — LIPID PANEL
Cholesterol: 231 mg/dL — ABNORMAL HIGH (ref 0–200)
HDL: 52 mg/dL (ref >40)
LDL Cholesterol: 154 mg/dL — ABNORMAL HIGH (ref 0–99)
Total CHOL/HDL Ratio: 4.4 RATIO
Triglycerides: 127 mg/dL (ref <150)
VLDL: 25 mg/dL (ref 0–40)

## 2021-11-17 MED ORDER — ASPIRIN 81 MG PO TBEC: 81.0000 mg | DELAYED_RELEASE_TABLET | Freq: Every day | ORAL | Status: DC

## 2021-11-17 MED ORDER — PROCHLORPERAZINE EDISYLATE 10 MG/2ML IJ SOLN: 10.0000 mg | Freq: Four times a day (QID) | INTRAMUSCULAR | Status: DC | PRN

## 2021-11-17 MED ORDER — AMLODIPINE BESYLATE 5 MG PO TABS
2.5000 mg | ORAL_TABLET | Freq: Every day | ORAL | Status: DC
  Administered 2021-11-17 – 2021-11-18 (×2): 2.5 mg via ORAL
  Filled 2021-11-17 (×2): qty 1

## 2021-11-17 MED ORDER — BUSPIRONE HCL 5 MG PO TABS: 5.0000 mg | ORAL_TABLET | Freq: Two times a day (BID) | ORAL | Status: DC | PRN

## 2021-11-17 MED ORDER — ROSUVASTATIN CALCIUM 20 MG PO TABS
40.0000 mg | ORAL_TABLET | Freq: Every day | ORAL | Status: DC
  Administered 2021-11-17 – 2021-11-18 (×2): 40 mg via ORAL
  Filled 2021-11-17 (×2): qty 2

## 2021-11-17 NOTE — Care Management Obs Status (Signed)
MEDICARE OBSERVATION STATUS NOTIFICATION   Patient Details  Name: [name] MRN: [id number] Date of Birth: 04-20-1946   Medicare Observation Status Notification Given:  Yes    Lawerance Sabal, RN 11/17/2021, 3:13 PM

## 2021-11-17 NOTE — Plan of Care (Signed)
  Problem: Ischemic Stroke/TIA Tissue Perfusion: Goal: Complications of ischemic stroke/TIA will be minimized Outcome: Progressing   Problem: Self-Care: Goal: Verbalization of feelings and concerns over difficulty with self-care will improve Outcome: Progressing   Problem: Education: Goal: Knowledge of secondary prevention will improve (SELECT ALL) Outcome: Progressing   Problem: Nutrition: Goal: Adequate nutrition will be maintained Outcome: Progressing

## 2021-11-17 NOTE — Progress Notes (Addendum)
STROKE TEAM PROGRESS NOTE   INTERVAL HISTORY No family is at the bedside.  She is not taking any of her medications, states she prefers natural remedies. She is willing to take a baby aspirin, plavix, and a statin. [facility] bayer stroke study candidate MRI scan of the brain shows small left cerebellar infarct.  MR angiogram showed diffuse intracranial atherosclerosis involving anterior circulation.  Echocardiogram is pending.  LDL is 154 mg percent and hemoglobin A1c 6.3. Vitals:   11/16/21 2155 11/16/21 2300 11/17/21 0415 11/17/21 0824  BP: (!) 173/75 (!) 169/78 (!) 166/67 (!) 156/84  Pulse: 75 75 70 72  Resp: 18 18 14 13   Temp: 98 F (36.7 C) 98.2 F (36.8 C) 97.8 F (36.6 C) 97.8 F (36.6 C)  TempSrc: Oral Oral Oral Oral  SpO2: 94% 94% 94% 95%  Weight: 65.8 kg     Height: 5' (1.524 m)      CBC:  Recent Labs  Lab 11/16/21 1513 11/16/21 1541 11/17/21 0132  WBC 6.5  --  7.3  NEUTROABS 5.1  --   --   HGB 13.9 13.9 13.0  HCT 41.0 41.0 37.8  MCV 92.1  --  89.8  PLT 248  --  [id number]   Basic Metabolic Panel:  Recent Labs  Lab 11/16/21 1513 11/16/21 1541 11/17/21 0132  NA 138 138 138  K 3.5 3.5 3.7  CL 104 105 107  CO2 23  --  24  GLUCOSE 105* 105* 123*  BUN 9 13 12   CREATININE 0.91 0.80 0.97  CALCIUM 9.5  --  9.3   Lipid Panel:  Recent Labs  Lab 11/17/21 0132  CHOL 231*  TRIG 127  HDL 52  CHOLHDL 4.4  VLDL 25  LDLCALC 154*   HgbA1c:  Recent Labs  Lab 11/17/21 0132  HGBA1C 6.3*   Urine Drug Screen:  Recent Labs  Lab 11/16/21 1600  LABOPIA NONE DETECTED  COCAINSCRNUR NONE DETECTED  LABBENZ NONE DETECTED  AMPHETMU NONE DETECTED  THCU NONE DETECTED  LABBARB NONE DETECTED    Alcohol Level  Recent Labs  Lab 11/16/21 1513  ETH <10    IMAGING past 24 hours CT Head Wo Contrast  Result Date: 11/16/2021 CLINICAL DATA:  Acute neurological deficit with stroke suspected. EXAM: CT HEAD WITHOUT CONTRAST TECHNIQUE: Contiguous axial images were obtained from  the base of the skull through the vertex without intravenous contrast. RADIATION DOSE REDUCTION: This exam was performed according to the departmental dose-optimization program which includes automated exposure control, adjustment of the mA and/or kV according to patient size and/or use of iterative reconstruction technique. COMPARISON:  CT 06/11/2015.  MRI 12/19/2019 FINDINGS: Brain: Mild diffuse cerebral atrophy. No ventricular dilatation. Multiple focal areas of low-attenuation demonstrated throughout the deep and periventricular white matter, in the left cerebellum, and in the pons. These likely represent old lacunar infarcts. These have progressed since the prior studies. No mass effect or midline shift. No abnormal extra-axial fluid collections. Gray-white matter junctions are distinct. Basal cisterns are not effaced. No acute intracranial hemorrhage. Vascular: Intracranial arterial vascular calcifications are present. Skull: Calvarium appears intact. Sinuses/Orbits: Paranasal sinuses and mastoid air cells are clear. Other: None. 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 There is progression since previous study. No acute intracranial hemorrhage or mass effect. Electronically Signed   By: Lucienne Capers M.D.   On: 11/16/2021 16:48   MR ANGIO HEAD WO CONTRAST  Result Date: 11/16/2021 CLINICAL DATA:  Dizziness, unsteady gait, left arm disc coordination, stroke suspected EXAM: MRI HEAD WITHOUT CONTRAST MRA HEAD WITHOUT CONTRAST MRA NECK WITHOUT CONTRAST TECHNIQUE: Multiplanar, multi-echo pulse sequences of the brain and surrounding structures were acquired without intravenous contrast. Angiographic images of the Circle of Willis were acquired using MRA technique without intravenous contrast. Angiographic images of the neck were acquired using MRA technique without intravenous contrast. Carotid stenosis  measurements (when applicable) are obtained utilizing NASCET criteria, using the distal internal carotid diameter as the denominator. The patient refused contrast. COMPARISON:  06/12/2015 MRI and MRA head clinical correlation is also made with 11/16/2021 CT head FINDINGS: MRI HEAD FINDINGS Brain: Two somewhat wedge-shaped areas of restricted diffusion with ADC correlate in the mid and inferior left cerebellum (series 5, image 60 and series 6, image 10; series 5, image 56 and series 6, image 6). These areas are associated with minimally increased T2 hyperintense signal. No acute hemorrhage, mass, mass effect, or midline shift. No hydrocephalus or extra-axial collection. Lacunar infarcts in the bilateral basal ganglia and pons. Vascular: Please see MRA findings below. Skull and upper cervical spine: Normal marrow signal. Degenerative changes in the cervical spine with trace anterolisthesis and a small disc bulge at C3-C4, which indents the ventral spinal cord. Sinuses/Orbits: No acute finding. Status post left lens replacement. Other: No acute intracranial process. MRA HEAD FINDINGS Anterior circulation: Both internal carotid arteries are patent to the termini, with increased stenosis in the right distal ICA, near the terminus, with multifocal moderate to severe stenosis (series 1, images 89, 91, and 95). A1 segments patent. Normal anterior communicating artery. Anterior cerebral arteries are patent to their distal aspects. Moderate multifocal narrowing of the right M1 segment (series 1, image 100), which has progressed from the prior exam. Moderate multifocal narrowing in the right M2 branches (for example series 1, image 102 and 92), new from the prior exam, with similar additional multifocal irregularity and poor signal in the more distal right MCA branches. Redemonstrated duplicate left MCA with mild irregularity in the more proximal M1 segment but no significant stenosis. Distal left MCA branches perfused.  Posterior circulation: Vertebral arteries patent to the vertebrobasilar junction without stenosis. Posterior inferior cerebral arteries patent proximally, with no signal in the left PICA just distal to its takeoff (series 1, images 22-24), without evidence of reconstitution, new from the prior exam. The visualized right PICA is patent. Basilar patent to its distal aspect, with a small fenestration proximally. Superior cerebellar arteries patent bilaterally. Patent right P1 segment. Fetal origin of the left PCA from a patent left posterior communicating artery. Moderate stenosis in the mid right P2 (series 1, images 103-108), unchanged. PCAs otherwise perfused to their distal aspects without stenosis. The left posterior communicating artery is not visualized. Anatomic variants: Fetal origin of the left PCA. MRA NECK FINDINGS Aortic arch: Two-vessel arch with a common origin of the brachiocephalic and left common carotid arteries. Imaged portion shows no evidence of aneurysm or dissection. No significant stenosis of the major arch vessel origins. Right carotid system: No evidence of dissection, occlusion, or hemodynamically significant stenosis (greater than 50%). Left carotid system: No evidence of dissection, occlusion, or hemodynamically significant stenosis (greater than 50%). Vertebral arteries: No evidence of dissection, occlusion, or hemodynamically significant stenosis (greater than 50%). Other: None IMPRESSION: 1. Acute or subacute infarcts in the PICA territory in the left mid and inferior cerebellum, with loss of signal in the left PICA just distal to its takeoff, which is new compared to 2016 and concerning  for occlusion. No evidence of hemorrhage, mass effect, or midline shift. 2. Progressive intracranial atherosclerotic disease compared to the prior exam, with moderate to severe stenosis in the distal right ICA and moderate multifocal narrowing in the right M1 and M2 branches. Other previously noted  stenoses are overall unchanged. 3.  No hemodynamically significant stenosis in the neck. These results will be called to the ordering clinician or representative by the Radiologist Assistant, and communication documented in the PACS or [employer]. Electronically Signed   By: Merilyn Baba M.D.   On: 11/16/2021 22:04   MR ANGIO NECK WO CONTRAST  Result Date: 11/16/2021 CLINICAL DATA:  Dizziness, unsteady gait, left arm disc coordination, stroke suspected EXAM: MRI HEAD WITHOUT CONTRAST MRA HEAD WITHOUT CONTRAST MRA NECK WITHOUT CONTRAST TECHNIQUE: Multiplanar, multi-echo pulse sequences of the brain and surrounding structures were acquired without intravenous contrast. Angiographic images of the Circle of Willis were acquired using MRA technique without intravenous contrast. Angiographic images of the neck were acquired using MRA technique without intravenous contrast. Carotid stenosis measurements (when applicable) are obtained utilizing NASCET criteria, using the distal internal carotid diameter as the denominator. The patient refused contrast. COMPARISON:  06/12/2015 MRI and MRA head clinical correlation is also made with 11/16/2021 CT head FINDINGS: MRI HEAD FINDINGS Brain: Two somewhat wedge-shaped areas of restricted diffusion with ADC correlate in the mid and inferior left cerebellum (series 5, image 60 and series 6, image 10; series 5, image 56 and series 6, image 6). These areas are associated with minimally increased T2 hyperintense signal. No acute hemorrhage, mass, mass effect, or midline shift. No hydrocephalus or extra-axial collection. Lacunar infarcts in the bilateral basal ganglia and pons. Vascular: Please see MRA findings below. Skull and upper cervical spine: Normal marrow signal. Degenerative changes in the cervical spine with trace anterolisthesis and a small disc bulge at C3-C4, which indents the ventral spinal cord. Sinuses/Orbits: No acute finding. Status post left lens replacement.  Other: No acute intracranial process. MRA HEAD FINDINGS Anterior circulation: Both internal carotid arteries are patent to the termini, with increased stenosis in the right distal ICA, near the terminus, with multifocal moderate to severe stenosis (series 1, images 89, 91, and 95). A1 segments patent. Normal anterior communicating artery. Anterior cerebral arteries are patent to their distal aspects. Moderate multifocal narrowing of the right M1 segment (series 1, image 100), which has progressed from the prior exam. Moderate multifocal narrowing in the right M2 branches (for example series 1, image 102 and 92), new from the prior exam, with similar additional multifocal irregularity and poor signal in the more distal right MCA branches. Redemonstrated duplicate left MCA with mild irregularity in the more proximal M1 segment but no significant stenosis. Distal left MCA branches perfused. Posterior circulation: Vertebral arteries patent to the vertebrobasilar junction without stenosis. Posterior inferior cerebral arteries patent proximally, with no signal in the left PICA just distal to its takeoff (series 1, images 22-24), without evidence of reconstitution, new from the prior exam. The visualized right PICA is patent. Basilar patent to its distal aspect, with a small fenestration proximally. Superior cerebellar arteries patent bilaterally. Patent right P1 segment. Fetal origin of the left PCA from a patent left posterior communicating artery. Moderate stenosis in the mid right P2 (series 1, images 103-108), unchanged. PCAs otherwise perfused to their distal aspects without stenosis. The left posterior communicating artery is not visualized. Anatomic variants: Fetal origin of the left PCA. MRA NECK FINDINGS Aortic arch: Two-vessel arch with a common origin  of the brachiocephalic and left common carotid arteries. Imaged portion shows no evidence of aneurysm or dissection. No significant stenosis of the major arch  vessel origins. Right carotid system: No evidence of dissection, occlusion, or hemodynamically significant stenosis (greater than 50%). Left carotid system: No evidence of dissection, occlusion, or hemodynamically significant stenosis (greater than 50%). Vertebral arteries: No evidence of dissection, occlusion, or hemodynamically significant stenosis (greater than 50%). Other: None IMPRESSION: 1. 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 No evidence of hemorrhage, mass effect, or midline shift. 2. Progressive intracranial atherosclerotic disease compared to the prior exam, with moderate to severe stenosis in the distal right ICA and moderate multifocal narrowing in the right M1 and M2 branches. Other previously noted stenoses are overall unchanged. 3.  No hemodynamically significant stenosis in the neck. These results will be called to the ordering clinician or representative by the Radiologist Assistant, and communication documented in the PACS or [employer]. Electronically Signed   By: Merilyn Baba M.D.   On: 11/16/2021 22:04   MR BRAIN WO CONTRAST  Result Date: 11/16/2021 CLINICAL DATA:  Dizziness, unsteady gait, left arm disc coordination, stroke suspected EXAM: MRI HEAD WITHOUT CONTRAST MRA HEAD WITHOUT CONTRAST MRA NECK WITHOUT CONTRAST TECHNIQUE: Multiplanar, multi-echo pulse sequences of the brain and surrounding structures were acquired without intravenous contrast. Angiographic images of the Circle of Willis were acquired using MRA technique without intravenous contrast. Angiographic images of the neck were acquired using MRA technique without intravenous contrast. Carotid stenosis measurements (when applicable) are obtained utilizing NASCET criteria, using the distal internal carotid diameter as the denominator. The patient  refused contrast. COMPARISON:  06/12/2015 MRI and MRA head clinical correlation is also made with 11/16/2021 CT head FINDINGS: MRI HEAD FINDINGS Brain: Two somewhat wedge-shaped areas of restricted diffusion with ADC correlate in the mid and inferior left cerebellum (series 5, image 60 and series 6, image 10; series 5, image 56 and series 6, image 6). These areas are associated with minimally increased T2 hyperintense signal. No acute hemorrhage, mass, mass effect, or midline shift. No hydrocephalus or extra-axial collection. Lacunar infarcts in the bilateral basal ganglia and pons. Vascular: Please see MRA findings below. Skull and upper cervical spine: Normal marrow signal. Degenerative changes in the cervical spine with trace anterolisthesis and a small disc bulge at C3-C4, which indents the ventral spinal cord. Sinuses/Orbits: No acute finding. Status post left lens replacement. Other: No acute intracranial process. MRA HEAD FINDINGS Anterior circulation: Both internal carotid arteries are patent to the termini, with increased stenosis in the right distal ICA, near the terminus, with multifocal moderate to severe stenosis (series 1, images 89, 91, and 95). A1 segments patent. Normal anterior communicating artery. Anterior cerebral arteries are patent to their distal aspects. Moderate multifocal narrowing of the right M1 segment (series 1, image 100), which has progressed from the prior exam. Moderate multifocal narrowing in the right M2 branches (for example series 1, image 102 and 92), new from the prior exam, with similar additional multifocal irregularity and poor signal in the more distal right MCA branches. Redemonstrated duplicate left MCA with mild irregularity in the more proximal M1 segment but no significant stenosis. Distal left MCA branches perfused. Posterior circulation: Vertebral arteries patent to the vertebrobasilar junction without stenosis. Posterior inferior cerebral arteries patent  proximally, with no  signal in the left PICA just distal to its takeoff (series 1, images 22-24), without evidence of reconstitution, new from the prior exam. The visualized right PICA is patent. Basilar patent to its distal aspect, with a small fenestration proximally. Superior cerebellar arteries patent bilaterally. Patent right P1 segment. Fetal origin of the left PCA from a patent left posterior communicating artery. Moderate stenosis in the mid right P2 (series 1, images 103-108), unchanged. PCAs otherwise perfused to their distal aspects without stenosis. The left posterior communicating artery is not visualized. Anatomic variants: Fetal origin of the left PCA. MRA NECK FINDINGS Aortic arch: Two-vessel arch with a common origin of the brachiocephalic and left common carotid arteries. Imaged portion shows no evidence of aneurysm or dissection. No significant stenosis of the major arch vessel origins. Right carotid system: No evidence of dissection, occlusion, or hemodynamically significant stenosis (greater than 50%). Left carotid system: No evidence of dissection, occlusion, or hemodynamically significant stenosis (greater than 50%). Vertebral arteries: No evidence of dissection, occlusion, or hemodynamically significant stenosis (greater than 50%). Other: None IMPRESSION: 1. 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 No evidence of hemorrhage, mass effect, or midline shift. 2. Progressive intracranial atherosclerotic disease compared to the prior exam, with moderate to severe stenosis in the distal right ICA and moderate multifocal narrowing in the right M1 and M2 branches. Other previously noted stenoses are overall unchanged. 3.  No hemodynamically significant stenosis in the neck. These results will be called to the ordering clinician or  representative by the Radiologist Assistant, and communication documented in the PACS or [employer]. Electronically Signed   By: Wiliam Ke M.D.   On: 11/16/2021 22:04    PHYSICAL EXAM  Physical Exam  Constitutional: Appears well-developed and well-nourished.  Pleasant elderly Caucasian lady Cardiovascular: Normal rate and regular rhythm.  Respiratory: Effort normal, non-labored breathing  Neuro: Mental Status: Patient is awake, alert, oriented to person, place, month, year, and situation. Patient is able to give a clear and coherent history. No signs of aphasia or neglect Cranial Nerves: II: Visual Fields are full. Pupils are equal, round, and reactive to light.   III,IV, VI: EOMI without ptosis or diploplia.  V: Facial sensation is symmetric to temperature VII: Facial movement is symmetric resting and smiling VIII: Hearing is intact to voice X: Palate elevates symmetrically XI: Shoulder shrug is symmetric. XII: Tongue protrudes midline without atrophy or fasciculations.  Motor: Tone is normal. Bulk is normal. 5/5 strength was present in all four extremities.  Sensory: Slight diminished sensation in left upper extremity  Cerebellar: Ataxia with left FTN, but intact knee to heel coordination  ASSESSMENT/PLAN [name] is a 76 y.o. female with history of hypertension, hyperlipidemia, prediabetes, medication nonadherence, SVT, anxiety/depression, Mnire's disease, remote smoking history presenting with dizziness, left arm weakness, nausea, and headache. Woke up at 2am on 5/23 and was dizzy. She was fine on 5/22 when she went to bed. MRI shows left PICA infarct and MRA shows moderate to severe stenosis and progressive intracranial atherosclerosis. She is still experiences some dizziness, however it is better when she is still. PT recommending outpatient vestibular therapy. Awaiting 2D echo results.   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 Progression since previous study. MRI  acute or subacute infarcts in the PICA territory in the left mid and inferior cerebellum  MRA Left PICA occlusion, Progressive intracranial atherosclerotic disease compared to the prior exam, with moderate to severe stenosis in the distal right ICA and moderate multifocal narrowing in the right M1 and M2 branches. 2D Echo pending LDL 154 HgbA1c 6.3 VTE prophylaxis -Lovenox    Diet   Diet clear liquid Room service appropriate? Yes; Fluid consistency: Thin   aspirin 81 mg daily prior to admission, now on aspirin 81 mg daily and clopidogrel 75 mg daily for 3 weeks and then ASA 81mg  alone.  Therapy recommendations:  Vestibular therapy outpatient Disposition:  Pending  Hypertension Home meds:  Noravsc Stable Permissive hypertension (OK if < 220/120) but gradually normalize in 5-7 days Long-term BP goal normotensive  Hyperlipidemia LDL 154, goal < 70 Add Crestor   High intensity statin not indicated  Continue statin at discharge  Other Stroke Risk Factors Advanced Age >/= 91   Other Active Problems Nausea/vomiting Improving, as needed antiemetics.  Remains on clear liquid diet- primary team plans to slowly advance Anxiety/depression Continue BuSpar for now-probably will benefit from SSRI initiation prior to discharge. History of Mnire's disease  Hospital day # 0  Patient seen and examined by NP/APP with MD. MD to update note as needed.   Kristie Ores, DNP, FNP-BC [facility] Pager: [phone]   STROKE MD NOTE : I have personally obtained history,examined this patient, reviewed notes, independently viewed imaging studies, participated in medical decision making and plan of care.ROS completed by me personally and pertinent positives fully documented  I have made any  additions or clarifications directly to the above note. Agree with note above.  Patient presented with dizziness and left upper extremity ataxia secondary to left PICA infarct etiology likely cryptogenic but strong suspicion for cardiogenic embolism from paroxysmal A-fib.  Recommend aspirin and Plavix for 3 weeks followed by aspirin alone.  Recommend outpatient prolonged cardiac monitoring for A-fib.  Mobilize out of bed.  Therapy consults.  Continue ongoing stroke work-up.  Long discussion with patient and husband regarding her stroke and discussed plan for evaluation, treatment plan and answered questions.  Patient may also consider possible participation in [employer] stroke prevention study and after reviewing study related information she decided not to participate.  Greater than 50% time during this 50-minute visit was spent on counseling and coordination of care about her stroke and discussion about prevention and treatment  Kristie Contras, MD Medical Director [place] Pager: [phone] 11/17/2021 2:29 PM   To contact Stroke Continuity provider, please refer to [email or web address] After hours, contact General Neurology

## 2021-11-17 NOTE — Evaluation (Signed)
Occupational Therapy Evaluation Patient Details Name: ELANEY WRIGHT MRN: [id number] DOB: [birth year] Today's Date: 11/17/2021   History of Present Illness MERILYNN METTERT is a 76 y.o. female admitted with LUE ataxia and weakness.  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  Also with recent left knee meniscus tear.   Clinical Impression   Pt seen for OT eval.   Currently, she presents with LUE ataxia as well as increased nausea and increased BP in sitting.  She vomited with transition to sitting and BP was checked at 232/84 and then 201/95 in sitting.  HR remained in the low 80s with O2 sats greater than 94% on room air throughout session.  She was able to complete simulated selfcare tasks and functional transfers with use of the RW for support at min guard assist level.  Feel she will benefit from acute care OT to address current decrease in ADL performance and LUE functional use.  Recommend continued rehab at outpatient level as pt will have 24 hr assist as needed from her spouse.       Recommendations for follow up therapy are one component of a multi-disciplinary discharge planning process, led by the attending physician.  Recommendations may be updated based on patient status, additional functional criteria and insurance authorization.   Follow Up Recommendations  Outpatient OT    Assistance Recommended at Discharge PRN  Patient can return home with the following Assistance with cooking/housework;Assist for transportation    Functional Status Assessment  Patient has had a recent decline in their functional status and demonstrates the ability to make significant improvements in function in a reasonable and predictable amount of time.  Equipment  Recommendations  None recommended by OT    Recommendations for Other Services       Precautions / Restrictions Precautions Precautions: Fall Precaution Comments: monitor BP Restrictions Weight Bearing Restrictions: No LLE Weight Bearing: Weight bearing as tolerated      Mobility Bed Mobility Overal bed mobility: Modified Independent                  Transfers Overall transfer level: Needs assistance Equipment used: Rolling walker (2 wheels) Transfers: Sit to/from Stand Sit to Stand: Min guard           General transfer comment: Pt ambulated with the RW and without short distances with min guard assist.  Min instructional cueing for not pulling up on the walker with sit to stand.      Balance Overall balance assessment: Needs assistance Sitting-balance support: Feet supported Sitting balance-Leahy Scale: Good     Standing balance support: During functional activity Standing balance-Leahy Scale: Fair                             ADL either performed or assessed with clinical judgement   ADL Overall ADL's : Needs assistance/impaired Eating/Feeding: Set up;Sitting   Grooming: Set up;Sitting Grooming Details (indicate cue type and reason): simulated Upper Body Bathing: Set up;Sitting Upper Body Bathing Details (indicate cue type and reason): simulated Lower Body Bathing: Min guard;Sit to/from stand   Upper Body Dressing : Set up;Sitting Upper Body Dressing Details (indicate cue type and reason): simulated Lower Body Dressing: Min guard;Sit to/from stand   Toilet Transfer: Min guard;Ambulation;Regular Control and instrumentation engineer  Transfer Details (indicate cue type and reason): simulated, pt declined need to toilet Toileting- Clothing Manipulation and Hygiene: Min guard;Sit to/from stand       Functional mobility during ADLs: Min guard;Rolling walker (2 wheels) General ADL Comments: Pt with increased nausea with transition to sitting.  BP initially after  vomiting up to 232/84 and then after resting 201/95 with nursing made aware.  She exhibits increased ataxia in the LUE with functional use but still able to integrate into tasks at a diminshed level.  Hx of recent left knee meniscus tear as well for which she was udergoing PT at outpatient facility.  Educated pt on completion of LUE functional activities to help with coordination such as eating with the left hand for periods of time and holding the soap bottle out in front of her as far as she can with the RUE, and then working on taking off and putting on the top.  Central vestibular deficits noted and will need to be worked on as pt with increased dizziness with mobility when asked to turn her head.     Vision Baseline Vision/History: 1 Wears glasses (wears contact in one eye only) Ability to See in Adequate Light: 0 Adequate Patient Visual Report: No change from baseline Vision Assessment?: Yes Eye Alignment: Within Functional Limits Ocular Range of Motion: Within Functional Limits Alignment/Gaze Preference: Within Defined Limits Tracking/Visual Pursuits: Able to track stimulus in all quads without difficulty Additional Comments: Pt with right end range holding horizontal nystagmus.  Nausea with transition to sitting as well.     Perception Perception Perception: Within Functional Limits   Praxis Praxis Praxis: Intact    Pertinent Vitals/Pain Pain Assessment Pain Assessment: Faces Faces Pain Scale: Hurts a little bit Pain Location: left knee with mobility Pain Descriptors / Indicators: Discomfort Pain Intervention(s): Limited activity within patient's tolerance, Monitored during session     Hand Dominance Right   Extremity/Trunk Assessment Upper Extremity Assessment Upper Extremity Assessment: LUE deficits/detail LUE Deficits / Details: Strength 5/5 throughout but increased dysmetria noted with reaching and finger to nose testing.  She is able to open and close a small soap  bottle as well as complete finger to thumb opposition. LUE Sensation: decreased light touch (decreased light touch in the digits) LUE Coordination: decreased fine motor;decreased gross motor   Lower Extremity Assessment Lower Extremity Assessment: Defer to PT evaluation   Cervical / Trunk Assessment Cervical / Trunk Assessment: Normal   Communication Communication Communication: No difficulties   Cognition Arousal/Alertness: Awake/alert Behavior During Therapy: WFL for tasks assessed/performed Overall Cognitive Status: Within Functional Limits for tasks assessed                                       General Comments  232/84 sitting EOB with HR 80bpm; 201/95 after pt sat a few min and pt had just thrown up as well.    Exercises     Shoulder Instructions      Home Living Family/patient expects to be discharged to:: Private residence Living Arrangements: Spouse/significant other Available Help at Discharge: Family Type of Home: House Home Access: Stairs to enter [employer] of Steps: 3 Entrance Stairs-Rails: Right Home Layout: Multi-level;Full bath on main level;Able to live on main level with bedroom/bathroom     Bathroom Shower/Tub: Producer, television/film/video: Standard Bathroom Accessibility: Yes How Accessible: Accessible via walker;Other (comment) (had to take door off  for access to the toilet) Home Equipment: [place] (2 wheels);Cane - single point;Cane - quad          Prior Functioning/Environment Prior Level of Function : Independent/Modified Independent             Mobility Comments: Used smaller based quad cane prior          OT Problem List: Impaired balance (sitting and/or standing);Decreased coordination;Impaired vision/perception;Impaired sensation      OT Treatment/Interventions: Self-care/ADL training;Visual/perceptual remediation/compensation;Balance training;Neuromuscular education;Patient/family  education;Therapeutic activities;DME and/or AE instruction    OT Goals(Current goals can be found in the care plan section) Acute Rehab OT Goals Patient Stated Goal: Pt wants to get her left arm better and not be nauseas. OT Goal Formulation: With patient/family Time For Goal Achievement: 12/01/21 Potential to Achieve Goals: Good  OT Frequency: Min 2X/week    Co-evaluation PT/OT/SLP Co-Evaluation/Treatment: Yes Reason for Co-Treatment: For patient/therapist safety;To address functional/ADL transfers   OT goals addressed during session: ADL's and self-care      AM-PAC OT "6 Clicks" Daily Activity     Outcome Measure Help from another person eating meals?: None Help from another person taking care of personal grooming?: A Little Help from another person toileting, which includes using toliet, bedpan, or urinal?: A Little Help from another person bathing (including washing, rinsing, drying)?: A Little Help from another person to put on and taking off regular upper body clothing?: A Little Help from another person to put on and taking off regular lower body clothing?: A Little 6 Click Score: 19   End of Session Equipment Utilized During Treatment: Gait belt;Rolling walker (2 wheels) Nurse Communication: Mobility status;Other (comment) (elevated BP)  Activity Tolerance: Other (comment) (limited by nausea) Patient left: in bed;with family/visitor present;Other (comment) (with PT in room as well)  OT Visit Diagnosis: Unsteadiness on feet (R26.81);Hemiplegia and hemiparesis;Ataxia, unspecified (R27.0) Hemiplegia - Right/Left: Left Hemiplegia - dominant/non-dominant: Non-Dominant Hemiplegia - caused by: Cerebral infarction                Time: 1025-1106 OT Time Calculation (min): 41 min Charges:  OT General Charges $OT Visit: 1 Visit OT Evaluation $OT Eval Moderate Complexity: 1 Mod OT Treatments $Self Care/Home Management : 8-22 mins Ayaansh Smail OTR/L 11/17/2021, 11:32 AM

## 2021-11-17 NOTE — Progress Notes (Signed)
Ordered a 30 day event monitor to be read by Dr. Marlou Porch.

## 2021-11-17 NOTE — Progress Notes (Signed)
PROGRESS NOTE        PATIENT DETAILS Name: [name] Age: 76 y.o. 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  Significant events: 5/23>> nausea/vomiting/left hand clumsiness/vertigo-acute PICA CVA.  Significant studies: 5/23>> CT head: No acute intracranial abnormality. 5/23>> MRI brain: Acute/subacute infarct in the left PICA territory in the left mid/inferior cerebellum. 5/23>> MRA brain: Loss of signal in the left PICA-progressive intracranial atherosclerotic disease compared to the prior exam with moderate to severe stenosis of the distal right ICA, moderate multifocal narrowing of right M1 M2 branches. 5/23>> MRA neck: No hemodynamically significant stenosis. 5/24>> LDL: 154 5/24>> A1c: 6.3  Significant microbiology data:   Procedures: None  Consults: Neurology  Subjective: Lying comfortably in bed-denies any chest pain or shortness of breath.  Per nursing staff-had 2 episodes of vomiting today.  Complains of nausea and ongoing vertigo   Objective: Vitals: Blood pressure (!) 185/83, pulse 77, temperature (!) 97.5 F (36.4 C), temperature source Oral, resp. rate 20, height 5' (1.524 m), weight 65.8 kg, SpO2 94 %.   Exam: Gen Exam:Alert awake-not in any distress HEENT:atraumatic, normocephalic Chest: B/L clear to auscultation anteriorly CVS:S1S2 regular Abdomen:soft non tender, non distended Extremities:no edema Neurology: Non focal Skin: no rash  Pertinent Labs/Radiology:    Latest Ref Rng & Units 11/17/2021    1:32 AM 11/16/2021    3:41 PM 11/16/2021    3:13 PM  CBC  WBC 4.0 - 10.5 K/uL 7.3    6.5    Hemoglobin 12.0 - 15.0 g/dL 13.0   13.9    13.9    Hematocrit 36.0 - 46.0 % 37.8   41.0   41.0    Platelets 150 - 400 K/uL 220    248      Lab Results  Component Value Date   NA 138 11/17/2021   K 3.7 11/17/2021   CL 107 11/17/2021   CO2 24 11/17/2021      Assessment/Plan: Acute left PICA CVA: Continues to have vertigo, nausea and vomiting-but no obvious focal deficits.  Continue aspirin/Plavix/statin-allow permissive hypertension.  Await echo and further recommendations from stroke MD.  Hypertensive urgency: In the setting of acute CVA-allow permissive hypertension.  On low-dose amlodipine-watch closely.  HLD: Starting statin  Nausea/vomiting: Continues to have nausea/vomiting-probably related to acute CVA/vertigo.  Continue supportive care with as needed antiemetics.  Remains on clear liquid diet-we will slowly advance.  History of anxiety/depression: Continue BuSpar for now-probably will benefit from SSRI initiation prior to discharge.  History of Mnire's disease  BMI: Estimated body mass index is 28.32 kg/m as calculated from the following:   Height as of this encounter: 5' (1.524 m).   Weight as of this encounter: 65.8 kg.   Code status:   Code Status: Full Code   DVT Prophylaxis: enoxaparin (LOVENOX) injection 40 mg Start: 11/17/21 2000 SCD's Start: 11/16/21 2311    Family Communication: None at bedside   Disposition Plan: Status is: Observation The patient will require care spanning > 2 midnights and should be moved to inpatient because: Acute CVA-work-up in progress-awaiting echo-but still with nausea and vomiting-remains on clear liquid diet.  Unable  to advance diet due to ongoing vomiting.   Planned Discharge Destination:Home   Diet: Diet Order             Diet clear liquid Room service appropriate? No; Fluid consistency: Thin  Diet effective now                     Antimicrobial agents: Anti-infectives (From admission, onward)    None        MEDICATIONS: Scheduled Meds:   amLODipine  2.5 mg Oral Daily   aspirin EC  81 mg Oral Daily   busPIRone  5 mg Oral BID   clopidogrel  75 mg Oral Daily   enoxaparin (LOVENOX) injection  40 mg Subcutaneous Daily   melatonin  5 mg Oral QHS   rosuvastatin  40 mg Oral Daily   Continuous Infusions:  sodium chloride 50 mL/hr at 11/16/21 2329   PRN Meds:.acetaminophen **OR** acetaminophen (TYLENOL) oral liquid 160 mg/5 mL **OR** acetaminophen, ondansetron (ZOFRAN) IV, prochlorperazine, traZODone   I have personally reviewed following labs and imaging studies  LABORATORY DATA: CBC: Recent Labs  Lab 11/16/21 1513 11/16/21 1541 11/17/21 0132  WBC 6.5  --  7.3  NEUTROABS 5.1  --   --   HGB 13.9 13.9 13.0  HCT 41.0 41.0 37.8  MCV 92.1  --  89.8  PLT 248  --  [id number]    Basic Metabolic Panel: Recent Labs  Lab 11/16/21 1513 11/16/21 1541 11/17/21 0132  NA 138 138 138  K 3.5 3.5 3.7  CL 104 105 107  CO2 23  --  24  GLUCOSE 105* 105* 123*  BUN 9 13 12   CREATININE 0.91 0.80 0.97  CALCIUM 9.5  --  9.3    GFR: Estimated Creatinine Clearance: 41.1 mL/min (by C-G formula based on SCr of 0.97 mg/dL).  Liver Function Tests: Recent Labs  Lab 11/16/21 1513 11/17/21 0132  AST 21 19  ALT 17 18  ALKPHOS 70 66  BILITOT 0.7 0.7  PROT 6.8 6.3*  ALBUMIN 4.0 3.6   No results for input(s): LIPASE, AMYLASE in the last 168 hours. No results for input(s): AMMONIA in the last 168 hours.  Coagulation Profile: Recent Labs  Lab 11/16/21 1513  INR 1.0    Cardiac Enzymes: No results for input(s): CKTOTAL, CKMB, CKMBINDEX, TROPONINI in the last 168 hours.  BNP (last 3 results) No results for input(s): PROBNP in the last 8760 hours.  Lipid Profile: Recent Labs    11/17/21 0132  CHOL 231*  HDL 52  LDLCALC 154*  TRIG 127  CHOLHDL 4.4    Thyroid Function Tests: No results for input(s): TSH, T4TOTAL, FREET4, T3FREE, THYROIDAB in the last 72 hours.  Anemia Panel: No results for input(s): VITAMINB12, FOLATE,  FERRITIN, TIBC, IRON, RETICCTPCT in the last 72 hours.  Urine analysis:    Component Value Date/Time   COLORURINE YELLOW 11/16/2021 1600   APPEARANCEUR CLEAR 11/16/2021 1600   LABSPEC 1.012 11/16/2021 1600   PHURINE 6.0 11/16/2021 1600   GLUCOSEU NEGATIVE 11/16/2021 1600   HGBUR NEGATIVE 11/16/2021 1600   BILIRUBINUR NEGATIVE 11/16/2021 1600   KETONESUR 5 (A) 11/16/2021 1600   PROTEINUR NEGATIVE 11/16/2021 1600   UROBILINOGEN 0.2 11/18/2014 0115   NITRITE NEGATIVE 11/16/2021 1600   LEUKOCYTESUR NEGATIVE 11/16/2021 1600    Sepsis Labs: Lactic Acid, Venous No results found for: LATICACIDVEN  MICROBIOLOGY: Recent Results (from the past 240 hour(s))  Resp Panel by RT-PCR (Flu A&B, Covid) Nasopharyngeal Swab  Status: None   Collection Time: 11/16/21  3:00 PM   Specimen: Nasopharyngeal Swab; Nasopharyngeal(NP) swabs in vial transport medium  Result Value Ref Range Status   SARS Coronavirus 2 by RT PCR NEGATIVE NEGATIVE Final    Comment: (NOTE) SARS-CoV-2 target nucleic acids are NOT DETECTED.  The SARS-CoV-2 RNA is generally detectable in upper respiratory specimens during the acute phase of infection. The lowest concentration of SARS-CoV-2 viral copies this assay can detect is 138 copies/mL. A negative result does not preclude SARS-Cov-2 infection and should not be used as the sole basis for treatment or other patient management decisions. A negative result may occur with  improper specimen collection/handling, submission of specimen other than nasopharyngeal swab, presence of viral mutation(s) within the areas targeted by this assay, and inadequate number of viral copies(<138 copies/mL). A negative result must be combined with clinical observations, patient history, and epidemiological information. The expected result is Negative.  Fact Sheet for Patients:  EntrepreneurPulse.com.au  Fact Sheet for Healthcare Providers:   IncredibleEmployment.be  This test is no t yet approved or cleared by the [place] FDA and  has been authorized for detection and/or diagnosis of SARS-CoV-2 by FDA under an Emergency Use Authorization (EUA). This EUA will remain  in effect (meaning this test can be used) for the duration of the COVID-19 declaration under Section 564(b)(1) of the Act, 21 U.S.C.section 360bbb-3(b)(1), unless the authorization is terminated  or revoked sooner.       Influenza A by PCR NEGATIVE NEGATIVE Final   Influenza B by PCR NEGATIVE NEGATIVE Final    Comment: (NOTE) The Xpert Xpress SARS-CoV-2/FLU/RSV plus assay is intended as an aid in the diagnosis of influenza from Nasopharyngeal swab specimens and should not be used as a sole basis for treatment. Nasal washings and aspirates are unacceptable for Xpert Xpress SARS-CoV-2/FLU/RSV testing.  Fact Sheet for Patients: EntrepreneurPulse.com.au  Fact Sheet for Healthcare Providers: IncredibleEmployment.be  This test is not yet approved or cleared by the [place] FDA and has been authorized for detection and/or diagnosis of SARS-CoV-2 by FDA under an Emergency Use Authorization (EUA). This EUA will remain in effect (meaning this test can be used) for the duration of the COVID-19 declaration under Section 564(b)(1) of the Act, 21 U.S.C. section 360bbb-3(b)(1), unless the authorization is terminated or revoked.  Performed at [facility], [place] [address] [place], [place] [address]     RADIOLOGY STUDIES/RESULTS: CT Head Wo Contrast  Result Date: 11/16/2021 CLINICAL DATA:  Acute neurological deficit with stroke suspected. EXAM: CT HEAD WITHOUT CONTRAST TECHNIQUE: Contiguous axial images were obtained from the base of the skull through the vertex without intravenous contrast. RADIATION DOSE REDUCTION: This exam was performed according to the departmental dose-optimization program  which includes automated exposure control, adjustment of the mA and/or kV according to patient size and/or use of iterative reconstruction technique. COMPARISON:  CT 06/11/2015.  MRI 12/19/2019 FINDINGS: Brain: Mild diffuse cerebral atrophy. No ventricular dilatation. Multiple focal areas of low-attenuation demonstrated throughout the deep and periventricular white matter, in the left cerebellum, and in the pons. These likely represent old lacunar infarcts. These have progressed since the prior studies. No mass effect or midline shift. No abnormal extra-axial fluid collections. Gray-white matter junctions are distinct. Basal cisterns are not effaced. No acute intracranial hemorrhage. Vascular: Intracranial arterial vascular calcifications are present. Skull: Calvarium appears intact. Sinuses/Orbits: Paranasal sinuses and mastoid air cells are clear. Other: None. IMPRESSION: Multiple old appearing lacunar infarcts demonstrated throughout the deep and  periventricular white matter, pons, and cerebellum. There is progression since previous study. No acute intracranial hemorrhage or mass effect. Electronically Signed   By: Lucienne Capers M.D.   On: 11/16/2021 16:48   MR ANGIO HEAD WO CONTRAST  Result Date: 11/16/2021 CLINICAL DATA:  Dizziness, unsteady gait, left arm disc coordination, stroke suspected EXAM: MRI HEAD WITHOUT CONTRAST MRA HEAD WITHOUT CONTRAST MRA NECK WITHOUT CONTRAST TECHNIQUE: Multiplanar, multi-echo pulse sequences of the brain and surrounding structures were acquired without intravenous contrast. Angiographic images of the Circle of Willis were acquired using MRA technique without intravenous contrast. Angiographic images of the neck were acquired using MRA technique without intravenous contrast. Carotid stenosis measurements (when applicable) are obtained utilizing NASCET criteria, using the distal internal carotid diameter as the denominator. The patient refused contrast. COMPARISON:   06/12/2015 MRI and MRA head clinical correlation is also made with 11/16/2021 CT head FINDINGS: MRI HEAD FINDINGS Brain: Two somewhat wedge-shaped areas of restricted diffusion with ADC correlate in the mid and inferior left cerebellum (series 5, image 60 and series 6, image 10; series 5, image 56 and series 6, image 6). These areas are associated with minimally increased T2 hyperintense signal. No acute hemorrhage, mass, mass effect, or midline shift. No hydrocephalus or extra-axial collection. Lacunar infarcts in the bilateral basal ganglia and pons. Vascular: Please see MRA findings below. Skull and upper cervical spine: Normal marrow signal. Degenerative changes in the cervical spine with trace anterolisthesis and a small disc bulge at C3-C4, which indents the ventral spinal cord. Sinuses/Orbits: No acute finding. Status post left lens replacement. Other: No acute intracranial process. MRA HEAD FINDINGS Anterior circulation: Both internal carotid arteries are patent to the termini, with increased stenosis in the right distal ICA, near the terminus, with multifocal moderate to severe stenosis (series 1, images 89, 91, and 95). A1 segments patent. Normal anterior communicating artery. Anterior cerebral arteries are patent to their distal aspects. Moderate multifocal narrowing of the right M1 segment (series 1, image 100), which has progressed from the prior exam. Moderate multifocal narrowing in the right M2 branches (for example series 1, image 102 and 92), new from the prior exam, with similar additional multifocal irregularity and poor signal in the more distal right MCA branches. Redemonstrated duplicate left MCA with mild irregularity in the more proximal M1 segment but no significant stenosis. Distal left MCA branches perfused. Posterior circulation: Vertebral arteries patent to the vertebrobasilar junction without stenosis. Posterior inferior cerebral arteries patent proximally, with no signal in the left  PICA just distal to its takeoff (series 1, images 22-24), without evidence of reconstitution, new from the prior exam. The visualized right PICA is patent. Basilar patent to its distal aspect, with a small fenestration proximally. Superior cerebellar arteries patent bilaterally. Patent right P1 segment. Fetal origin of the left PCA from a patent left posterior communicating artery. Moderate stenosis in the mid right P2 (series 1, images 103-108), unchanged. PCAs otherwise perfused to their distal aspects without stenosis. The left posterior communicating artery is not visualized. Anatomic variants: Fetal origin of the left PCA. MRA NECK FINDINGS Aortic arch: Two-vessel arch with a common origin of the brachiocephalic and left common carotid arteries. Imaged portion shows no evidence of aneurysm or dissection. No significant stenosis of the major arch vessel origins. Right carotid system: No evidence of dissection, occlusion, or hemodynamically significant stenosis (greater than 50%). Left carotid system: No evidence of dissection, occlusion, or hemodynamically significant stenosis (greater than 50%). Vertebral arteries: No evidence of dissection, occlusion,  or hemodynamically significant stenosis (greater than 50%). Other: None IMPRESSION: 1. 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 No evidence of hemorrhage, mass effect, or midline shift. 2. Progressive intracranial atherosclerotic disease compared to the prior exam, with moderate to severe stenosis in the distal right ICA and moderate multifocal narrowing in the right M1 and M2 branches. Other previously noted stenoses are overall unchanged. 3.  No hemodynamically significant stenosis in the neck. These results will be called to the ordering clinician or representative by the Radiologist  Assistant, and communication documented in the PACS or [employer]. Electronically Signed   By: Merilyn Baba M.D.   On: 11/16/2021 22:04   MR ANGIO NECK WO CONTRAST  Result Date: 11/16/2021 CLINICAL DATA:  Dizziness, unsteady gait, left arm disc coordination, stroke suspected EXAM: MRI HEAD WITHOUT CONTRAST MRA HEAD WITHOUT CONTRAST MRA NECK WITHOUT CONTRAST TECHNIQUE: Multiplanar, multi-echo pulse sequences of the brain and surrounding structures were acquired without intravenous contrast. Angiographic images of the Circle of Willis were acquired using MRA technique without intravenous contrast. Angiographic images of the neck were acquired using MRA technique without intravenous contrast. Carotid stenosis measurements (when applicable) are obtained utilizing NASCET criteria, using the distal internal carotid diameter as the denominator. The patient refused contrast. COMPARISON:  06/12/2015 MRI and MRA head clinical correlation is also made with 11/16/2021 CT head FINDINGS: MRI HEAD FINDINGS Brain: Two somewhat wedge-shaped areas of restricted diffusion with ADC correlate in the mid and inferior left cerebellum (series 5, image 60 and series 6, image 10; series 5, image 56 and series 6, image 6). These areas are associated with minimally increased T2 hyperintense signal. No acute hemorrhage, mass, mass effect, or midline shift. No hydrocephalus or extra-axial collection. Lacunar infarcts in the bilateral basal ganglia and pons. Vascular: Please see MRA findings below. Skull and upper cervical spine: Normal marrow signal. Degenerative changes in the cervical spine with trace anterolisthesis and a small disc bulge at C3-C4, which indents the ventral spinal cord. Sinuses/Orbits: No acute finding. Status post left lens replacement. Other: No acute intracranial process. MRA HEAD FINDINGS Anterior circulation: Both internal carotid arteries are patent to the termini, with increased stenosis in the right distal  ICA, near the terminus, with multifocal moderate to severe stenosis (series 1, images 89, 91, and 95). A1 segments patent. Normal anterior communicating artery. Anterior cerebral arteries are patent to their distal aspects. Moderate multifocal narrowing of the right M1 segment (series 1, image 100), which has progressed from the prior exam. Moderate multifocal narrowing in the right M2 branches (for example series 1, image 102 and 92), new from the prior exam, with similar additional multifocal irregularity and poor signal in the more distal right MCA branches. Redemonstrated duplicate left MCA with mild irregularity in the more proximal M1 segment but no significant stenosis. Distal left MCA branches perfused. Posterior circulation: Vertebral arteries patent to the vertebrobasilar junction without stenosis. Posterior inferior cerebral arteries patent proximally, with no signal in the left PICA just distal to its takeoff (series 1, images 22-24), without evidence of reconstitution, new from the prior exam. The visualized right PICA is patent. Basilar patent to its distal aspect, with a small fenestration proximally. Superior cerebellar arteries patent bilaterally. Patent right P1 segment. Fetal origin of the left PCA from a patent left posterior communicating artery. Moderate  stenosis in the mid right P2 (series 1, images 103-108), unchanged. PCAs otherwise perfused to their distal aspects without stenosis. The left posterior communicating artery is not visualized. Anatomic variants: Fetal origin of the left PCA. MRA NECK FINDINGS Aortic arch: Two-vessel arch with a common origin of the brachiocephalic and left common carotid arteries. Imaged portion shows no evidence of aneurysm or dissection. No significant stenosis of the major arch vessel origins. Right carotid system: No evidence of dissection, occlusion, or hemodynamically significant stenosis (greater than 50%). Left carotid system: No evidence of dissection,  occlusion, or hemodynamically significant stenosis (greater than 50%). Vertebral arteries: No evidence of dissection, occlusion, or hemodynamically significant stenosis (greater than 50%). Other: None IMPRESSION: 1. 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 No evidence of hemorrhage, mass effect, or midline shift. 2. Progressive intracranial atherosclerotic disease compared to the prior exam, with moderate to severe stenosis in the distal right ICA and moderate multifocal narrowing in the right M1 and M2 branches. Other previously noted stenoses are overall unchanged. 3.  No hemodynamically significant stenosis in the neck. These results will be called to the ordering clinician or representative by the Radiologist Assistant, and communication documented in the PACS or [employer]. Electronically Signed   By: Merilyn Baba M.D.   On: 11/16/2021 22:04   MR BRAIN WO CONTRAST  Result Date: 11/16/2021 CLINICAL DATA:  Dizziness, unsteady gait, left arm disc coordination, stroke suspected EXAM: MRI HEAD WITHOUT CONTRAST MRA HEAD WITHOUT CONTRAST MRA NECK WITHOUT CONTRAST TECHNIQUE: Multiplanar, multi-echo pulse sequences of the brain and surrounding structures were acquired without intravenous contrast. Angiographic images of the Circle of Willis were acquired using MRA technique without intravenous contrast. Angiographic images of the neck were acquired using MRA technique without intravenous contrast. Carotid stenosis measurements (when applicable) are obtained utilizing NASCET criteria, using the distal internal carotid diameter as the denominator. The patient refused contrast. COMPARISON:  06/12/2015 MRI and MRA head clinical correlation is also made with 11/16/2021 CT head FINDINGS: MRI HEAD FINDINGS Brain: Two somewhat wedge-shaped areas  of restricted diffusion with ADC correlate in the mid and inferior left cerebellum (series 5, image 60 and series 6, image 10; series 5, image 56 and series 6, image 6). These areas are associated with minimally increased T2 hyperintense signal. No acute hemorrhage, mass, mass effect, or midline shift. No hydrocephalus or extra-axial collection. Lacunar infarcts in the bilateral basal ganglia and pons. Vascular: Please see MRA findings below. Skull and upper cervical spine: Normal marrow signal. Degenerative changes in the cervical spine with trace anterolisthesis and a small disc bulge at C3-C4, which indents the ventral spinal cord. Sinuses/Orbits: No acute finding. Status post left lens replacement. Other: No acute intracranial process. MRA HEAD FINDINGS Anterior circulation: Both internal carotid arteries are patent to the termini, with increased stenosis in the right distal ICA, near the terminus, with multifocal moderate to severe stenosis (series 1, images 89, 91, and 95). A1 segments patent. Normal anterior communicating artery. Anterior cerebral arteries are patent to their distal aspects. Moderate multifocal narrowing of the right M1 segment (series 1, image 100), which has progressed from the prior exam. Moderate multifocal narrowing in the right M2 branches (for example series 1, image 102 and 92), new from the prior exam, with similar additional multifocal irregularity and poor signal in the  more distal right MCA branches. Redemonstrated duplicate left MCA with mild irregularity in the more proximal M1 segment but no significant stenosis. Distal left MCA branches perfused. Posterior circulation: Vertebral arteries patent to the vertebrobasilar junction without stenosis. Posterior inferior cerebral arteries patent proximally, with no signal in the left PICA just distal to its takeoff (series 1, images 22-24), without evidence of reconstitution, new from the prior exam. The visualized right PICA is  patent. Basilar patent to its distal aspect, with a small fenestration proximally. Superior cerebellar arteries patent bilaterally. Patent right P1 segment. Fetal origin of the left PCA from a patent left posterior communicating artery. Moderate stenosis in the mid right P2 (series 1, images 103-108), unchanged. PCAs otherwise perfused to their distal aspects without stenosis. The left posterior communicating artery is not visualized. Anatomic variants: Fetal origin of the left PCA. MRA NECK FINDINGS Aortic arch: Two-vessel arch with a common origin of the brachiocephalic and left common carotid arteries. Imaged portion shows no evidence of aneurysm or dissection. No significant stenosis of the major arch vessel origins. Right carotid system: No evidence of dissection, occlusion, or hemodynamically significant stenosis (greater than 50%). Left carotid system: No evidence of dissection, occlusion, or hemodynamically significant stenosis (greater than 50%). Vertebral arteries: No evidence of dissection, occlusion, or hemodynamically significant stenosis (greater than 50%). Other: None IMPRESSION: 1. 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 No evidence of hemorrhage, mass effect, or midline shift. 2. Progressive intracranial atherosclerotic disease compared to the prior exam, with moderate to severe stenosis in the distal right ICA and moderate multifocal narrowing in the right M1 and M2 branches. Other previously noted stenoses are overall unchanged. 3.  No hemodynamically significant stenosis in the neck. These results will be called to the ordering clinician or representative by the Radiologist Assistant, and communication documented in the PACS or [employer]. Electronically Signed   By: Merilyn Baba M.D.   On: 11/16/2021 22:04     LOS:  0 days   Oren Binet, MD  [facility]    To contact the attending provider between 7A-7P or the covering provider during after hours 7P-7A, please log into the web site [email or web address] and access using universal [place] password for that web site. If you do not have the password, please call the hospital operator.  11/17/2021, 1:25 PM

## 2021-11-17 NOTE — Evaluation (Addendum)
Physical Therapy Evaluation Patient Details Name: [name] MRN: [id number] DOB: June 12, 1946 Today's Date: 11/17/2021  History of Present Illness  [name] is a 76 y.o. female admitted with LUE ataxia and weakness.  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  Also with recent left knee meniscus tear.  Clinical Impression  Pt admitted with above diagnosis. Pt was able to ambulate with min guard to min assist with more difficulty with challenges due to vertigo.  Pt husband will be there to support pt and anticipate her making good progress. Pt given x1 exercises to complete.  Also educated pt in compensatory techniques with movement. Will follow acutely. Pt currently with functional limitations due to the deficits listed below (see PT Problem List). Pt will benefit from skilled PT to increase their independence and safety with mobility to allow discharge to the venue listed below.          Recommendations for follow up therapy are one component of a multi-disciplinary discharge planning process, led by the attending physician.  Recommendations may be updated based on patient status, additional functional criteria and insurance authorization.  Follow Up Recommendations Outpatient PT (vestibular therapy)    Assistance Recommended at Discharge Intermittent Supervision/Assistance  Patient can return home with the following  A little help with walking and/or transfers;Help with stairs or ramp for entrance    Equipment Recommendations Rolling walker (2 wheels)  Recommendations for Other Services       Functional Status Assessment Patient has had a recent decline in their functional status and demonstrates the ability to make significant improvements in  function in a reasonable and predictable amount of time.     Precautions / Restrictions Precautions Precautions: Fall Precaution Comments: monitor BP Restrictions Weight Bearing Restrictions: No LLE Weight Bearing: Weight bearing as tolerated      Mobility  Bed Mobility Overal bed mobility: Modified Independent                  Transfers Overall transfer level: Needs assistance Equipment used: Rolling walker (2 wheels) Transfers: Sit to/from Stand Sit to Stand: Min guard           General transfer comment: Min instructional cueing for not pulling up on the walker with sit to stand.    Ambulation/Gait Ambulation/Gait assistance: Min guard, Min assist Gait Distance (Feet): 100 Feet Assistive device: Rolling walker (2 wheels), None Gait Pattern/deviations: Step-through pattern, Decreased stride length, Decreased weight shift to left, Drifts right/left   Gait velocity interpretation: <1.31 ft/sec, indicative of household ambulator   General Gait Details: Overall pt ambulating with min guard assist with and without RW.  A little more tentative gait without RW however no LOB noted.  No ataxia noted as well. Pt could not perform head turns without dizziness and a little drift of which pt self corrected.  Pt does not weight bear as much of left knee due to meniscal tear that she was being treated for by MD PTA.  Stairs            Wheelchair Mobility    Modified Rankin (Stroke Patients Only) Modified Rankin (Stroke Patients Only) Pre-Morbid Rankin Score: Moderate disability Modified Rankin: Moderately severe disability     Balance Overall balance assessment: Needs assistance Sitting-balance support: Feet supported Sitting balance-Leahy Scale: Good  Standing balance support: During functional activity Standing balance-Leahy Scale: Fair Standing balance comment: Static and dynamic activity with min guard assist to min assist             High level  balance activites: Head turns, Turns High Level Balance Comments: Min guard to min assit for these tasks             Pertinent Vitals/Pain Pain Assessment Pain Assessment: Faces Faces Pain Scale: Hurts a little bit Pain Location: left knee with mobility Pain Descriptors / Indicators: Discomfort Pain Intervention(s): Limited activity within patient's tolerance, Monitored during session, Repositioned    Home Living Family/patient expects to be discharged to:: Private residence Living Arrangements: Spouse/significant other Available Help at Discharge: Family Type of Home: House Home Access: Stairs to enter Entrance Stairs-Rails: Right Entrance Stairs-Number of Steps: 3   Home Layout: Multi-level;Full bath on main level;Able to live on main level with bedroom/bathroom Home Equipment: Rolling Walker (2 wheels);Cane - single point;Cane - quad      Prior Function Prior Level of Function : Independent/Modified Independent             Mobility Comments: Used smaller based quad cane prior       Hand Dominance   Dominant Hand: Right    Extremity/Trunk Assessment   Upper Extremity Assessment Upper Extremity Assessment: Defer to OT evaluation LUE Deficits / Details: Strength 5/5 throughout but increased dysmetria noted with reaching and finger to nose testing.  She is able to open and close a small soap bottle as well as complete finger to thumb opposition. LUE Sensation: decreased light touch (decreased light touch in the digits) LUE Coordination: decreased fine motor;decreased gross motor    Lower Extremity Assessment Lower Extremity Assessment: Overall WFL for tasks assessed    Cervical / Trunk Assessment Cervical / Trunk Assessment: Normal  Communication   Communication: No difficulties  Cognition Arousal/Alertness: Awake/alert Behavior During Therapy: WFL for tasks assessed/performed Overall Cognitive Status: Within Functional Limits for tasks assessed                                           General Comments General comments (skin integrity, edema, etc.): 232/84 sitting EOB with HR 80bpm; 201/95 after pt sat a few min and pt had just thrown up as well. Nurse made aware.  Assessed pt for vertigo as pt dizzy and nauseated. Pt was positive for left hypofunction and x1 exercises were initiated.    Exercises Other Exercises Other Exercises: x1 exercises initiated and pt to perform a set every 2 hours.   Assessment/Plan    PT Assessment Patient needs continued PT services  PT Problem List Decreased activity tolerance;Decreased balance;Decreased mobility;Decreased coordination;Decreased knowledge of use of DME;Decreased safety awareness;Decreased knowledge of precautions       PT Treatment Interventions DME instruction;Gait training;Functional mobility training;Therapeutic activities;Therapeutic exercise;Stair training;Balance training;Patient/family education (Vestibular assesment)    PT Goals (Current goals can be found in the Care Plan section)  Acute Rehab PT Goals Patient Stated Goal: to go home PT Goal Formulation: With patient Time For Goal Achievement: 12/01/21 Potential to Achieve Goals: Good    Frequency Min 4X/week     Co-evaluation PT/OT/SLP Co-Evaluation/Treatment: Yes Reason for Co-Treatment: Complexity of the patient's impairments (multi-system involvement);For patient/therapist safety PT goals addressed during session: Mobility/safety with mobility OT goals addressed during session: ADL's and self-care  AM-PAC PT "6 Clicks" Mobility  Outcome Measure Help needed turning from your back to your side while in a flat bed without using bedrails?: None Help needed moving from lying on your back to sitting on the side of a flat bed without using bedrails?: None Help needed moving to and from a bed to a chair (including a wheelchair)?: A Little Help needed standing up from a chair using your arms (e.g.,  wheelchair or bedside chair)?: A Little Help needed to walk in hospital room?: A Little Help needed climbing 3-5 steps with a railing? : A Lot 6 Click Score: 19    End of Session Equipment Utilized During Treatment: Gait belt Activity Tolerance: Patient tolerated treatment well Patient left: in bed;with call bell/phone within reach;with bed alarm set;with family/visitor present Nurse Communication: Mobility status PT Visit Diagnosis: Unsteadiness on feet (R26.81);Muscle weakness (generalized) (M62.81);Dizziness and giddiness (R42)    Time: [id number] PT Time Calculation (min) (ACUTE ONLY): 39 min   Charges:   PT Evaluation $PT Eval Moderate Complexity: 1 Mod PT Treatments $Gait Training: 8-22 mins        Chelsia Serres M,PT Acute Rehab Services [phone] (pager)   Alvira Philips 11/17/2021, 12:58 PM

## 2021-11-17 NOTE — Evaluation (Signed)
Speech Language Pathology Evaluation Patient Details Name: [name] MRN: [id number] DOB: [birth year] Today's Date: 11/17/2021 Time: 4132-4401 SLP Time Calculation (min) (ACUTE ONLY): 13 min  Problem List:  Patient Active Problem List   Diagnosis Date Noted   Hypertensive urgency 11/16/2021   Focal neurological deficit 11/16/2021   Paroxysmal atrial tachycardia (HCC) 06/18/2021   Chest pain of uncertain etiology 06/18/2021   Mixed hyperlipidemia 06/18/2021   TIA (transient ischemic attack) 06/12/2015   Amaurosis fugax    Atypical migraine    Amaurosis fugax of left eye 06/11/2015   Dizziness and giddiness 04/04/2013   Abnormality of gait 02/15/2013   Spinal stenosis of cervical region 02/15/2013   Palpitations 11/28/2012   History of supraventricular tachycardia 11/28/2012   Essential hypertension 11/28/2012   History of CVA (cerebrovascular accident) 11/28/2012   Past Medical History:  Past Medical History:  Diagnosis Date   Anxiety    Arthritis    Balance problem    Depression    Double vision 10/2014   bilateral   Dyslipidemia    High blood pressure    Multiple thyroid nodules    OSA (obstructive sleep apnea)    Paroxysmal SVT (supraventricular tachycardia) (HCC)    PVC (premature ventricular contraction)    intermittent   Syncope and collapse    last friday had alot of dizziness   Past Surgical History:  Past Surgical History:  Procedure Laterality Date   ABDOMINAL HYSTERECTOMY     catheter ablation     TONSILLECTOMY     HPI:  Patient is a 76 y.o. female admitted with LUE ataxia and weakness.  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 PMH significant for hypertension, hyperlipidemia, prediabetes, medication nonadherence, SVT, anxiety/depression, Mnire's disease.   Assessment / Plan / Recommendation Clinical Impression  Patient was evaluated for cognitive-linguistic and  speech abilities and was found to be WNL for all areas. Memory, reasoning, safety awareness, problem solving all WNL. No oral motor weakness or asymmetry and patient is without dysarthria. Patient denies any cognitive-linguistic or speech difficulties. SLP not recommending any further intervention.    SLP Assessment  SLP Recommendation/Assessment: Patient does not need any further Speech Lanaguage Pathology Services SLP Visit Diagnosis: Cognitive communication deficit (R41.841)    Recommendations for follow up therapy are one component of a multi-disciplinary discharge planning process, led by the attending physician.  Recommendations may be updated based on patient status, additional functional criteria and insurance authorization.    Follow Up Recommendations  No SLP follow up    Assistance Recommended at Discharge  None  Functional Status Assessment Patient has not had a recent decline in their functional status  Frequency and Duration     N/A      SLP Evaluation Cognition  Overall Cognitive Status: Within Functional Limits for tasks assessed Arousal/Alertness: Awake/alert Orientation Level: Oriented X4       Comprehension  Auditory Comprehension Overall Auditory Comprehension: Appears within functional limits for tasks assessed    Expression Expression Primary Mode of Expression: Verbal Verbal Expression Overall Verbal Expression: Appears within functional limits for tasks assessed Written Expression Dominant Hand: Right   Oral / Motor  Oral Motor/Sensory Function Overall Oral Motor/Sensory Function: Within functional limits Motor Speech Overall Motor Speech: Appears within functional limits for tasks assessed Respiration: Within functional limits Resonance: Within functional limits Articulation: Within functional limitis Intelligibility: Intelligible Motor Planning: Witnin functional limits Motor Speech Errors: Not  applicable            Angela Nevin, MA,  CCC-SLP Speech Therapy

## 2021-11-18 DIAGNOSIS — I639 Cerebral infarction, unspecified: Secondary | ICD-10-CM | POA: Diagnosis not present

## 2021-11-18 DIAGNOSIS — I160 Hypertensive urgency: Secondary | ICD-10-CM | POA: Diagnosis not present

## 2021-11-18 DIAGNOSIS — R42 Dizziness and giddiness: Secondary | ICD-10-CM | POA: Diagnosis not present

## 2021-11-18 MED ORDER — ONDANSETRON 4 MG PO TBDP: 4.0000 mg | ORAL_TABLET | Freq: Three times a day (TID) | ORAL | 0 refills | Status: DC | PRN

## 2021-11-18 MED ORDER — CLOPIDOGREL BISULFATE 75 MG PO TABS: 75.0000 mg | ORAL_TABLET | Freq: Every day | ORAL | 0 refills | Status: DC

## 2021-11-18 MED ORDER — AMLODIPINE BESYLATE 5 MG PO TABS: 5.0000 mg | ORAL_TABLET | Freq: Every day | ORAL | 2 refills | Status: AC

## 2021-11-18 MED ORDER — ROSUVASTATIN CALCIUM 40 MG PO TABS: 40.0000 mg | ORAL_TABLET | Freq: Every day | ORAL | 2 refills | Status: DC

## 2021-11-18 MED ORDER — ASPIRIN EC 81 MG PO TBEC: 81.0000 mg | DELAYED_RELEASE_TABLET | Freq: Every day | ORAL | 2 refills | Status: DC

## 2021-11-18 NOTE — Progress Notes (Addendum)
STROKE TEAM PROGRESS NOTE   INTERVAL HISTORY No family is at the bedside.  She was previously not taking any of her medications, but she is agreeable to medications. MRI scan of the brain shows small left cerebellar infarct.  MR angiogram showed diffuse intracranial atherosclerosis involving anterior circulation.  LDL is 154 mg percent and hemoglobin A1c 6.3.  Plan to discharge today.  Therapist recommend outpatient therapy.   Vitals:   11/17/21 2318 11/18/21 0309 11/18/21 0317 11/18/21 0830  BP: (!) 165/68 (!) 162/77 (!) 162/77 (!) 167/82  Pulse: 72 80 76 66  Resp: 17 17 15 13   Temp: 98.2 F (36.8 C)  97.9 F (36.6 C) (!) 97.4 F (36.3 C)  TempSrc: Oral  Oral Oral  SpO2: 91% 94% 96% 93%  Weight:      Height:       CBC:  Recent Labs  Lab 11/16/21 1513 11/16/21 1541 11/17/21 0132  WBC 6.5  --  7.3  NEUTROABS 5.1  --   --   HGB 13.9 13.9 13.0  HCT 41.0 41.0 37.8  MCV 92.1  --  89.8  PLT 248  --  [id number]    Basic Metabolic Panel:  Recent Labs  Lab 11/16/21 1513 11/16/21 1541 11/17/21 0132  NA 138 138 138  K 3.5 3.5 3.7  CL 104 105 107  CO2 23  --  24  GLUCOSE 105* 105* 123*  BUN 9 13 12   CREATININE 0.91 0.80 0.97  CALCIUM 9.5  --  9.3    Lipid Panel:  Recent Labs  Lab 11/17/21 0132  CHOL 231*  TRIG 127  HDL 52  CHOLHDL 4.4  VLDL 25  LDLCALC 154*    HgbA1c:  Recent Labs  Lab 11/17/21 0132  HGBA1C 6.3*    Urine Drug Screen:  Recent Labs  Lab 11/16/21 1600  LABOPIA NONE DETECTED  COCAINSCRNUR NONE DETECTED  LABBENZ NONE DETECTED  AMPHETMU NONE DETECTED  THCU NONE DETECTED  LABBARB NONE DETECTED     Alcohol Level  Recent Labs  Lab 11/16/21 1513  ETH <10     IMAGING past 24 hours ECHOCARDIOGRAM COMPLETE  Result Date: 11/17/2021    ECHOCARDIOGRAM REPORT   Patient Name:   [name] Date of Exam: 11/17/2021 Medical Rec #:  [id number]         Height:       60.0 in Accession #:    [id number]        Weight:       145.0 lb Date of Birth:   07/23/1945         BSA:          1.628 m Patient Age:    76 years          BP:           168/77 mmHg Patient Gender: F                 HR:           70 bpm. Exam Location:  Inpatient Procedure: 2D Echo, Cardiac Doppler and Color Doppler Indications:    TIA  History:        Patient has prior history of Echocardiogram examinations, most                 recent 06/12/2015. Arrythmias:Tachycardia and PVC,                 Signs/Symptoms:Syncope; Risk Factors:Dyslipidemia and  Hypertension.  Sonographer:    Luisa Hart RDCS Referring Phys: [place]  1. Left ventricular ejection fraction, by estimation, is 65 to 70%. The left ventricle has hyperdynamic function. The left ventricle has no regional wall motion abnormalities. There is moderate left ventricular hypertrophy. Left ventricular diastolic parameters are consistent with Grade I diastolic dysfunction (impaired relaxation).  2. Right ventricular systolic function is normal. The right ventricular size is normal. Tricuspid regurgitation signal is inadequate for assessing PA pressure.  3. The mitral valve is normal in structure. No evidence of mitral valve regurgitation. No evidence of mitral stenosis.  4. The aortic valve is tricuspid. Aortic valve regurgitation is mild. No aortic stenosis is present.  5. The inferior vena cava is normal in size with <50% respiratory variability, suggesting right atrial pressure of 8 mmHg. FINDINGS  Left Ventricle: Left ventricular ejection fraction, by estimation, is 65 to 70%. The left ventricle has hyperdynamic function. The left ventricle has no regional wall motion abnormalities. The left ventricular internal cavity size was normal in size. There is moderate left ventricular hypertrophy. Left ventricular diastolic parameters are consistent with Grade I diastolic dysfunction (impaired relaxation). Right Ventricle: The right ventricular size is normal. No increase in right ventricular wall  thickness. Right ventricular systolic function is normal. Tricuspid regurgitation signal is inadequate for assessing PA pressure. Left Atrium: Left atrial size was normal in size. Right Atrium: Right atrial size was normal in size. Pericardium: Trivial pericardial effusion is present. Mitral Valve: The mitral valve is normal in structure. No evidence of mitral valve regurgitation. No evidence of mitral valve stenosis. Tricuspid Valve: The tricuspid valve is normal in structure. Tricuspid valve regurgitation is not demonstrated. Aortic Valve: The aortic valve is tricuspid. Aortic valve regurgitation is mild. Aortic regurgitation PHT measures 572 msec. No aortic stenosis is present. Aortic valve mean gradient measures 6.0 mmHg. Aortic valve peak gradient measures 11.6 mmHg. Aortic valve area, by VTI measures 2.03 cm. Pulmonic Valve: The pulmonic valve was normal in structure. Pulmonic valve regurgitation is trivial. Aorta: The aortic root is normal in size and structure. Venous: The inferior vena cava is normal in size with less than 50% respiratory variability, suggesting right atrial pressure of 8 mmHg. IAS/Shunts: No atrial level shunt detected by color flow Doppler.  LEFT VENTRICLE PLAX 2D LVIDd:         4.40 cm     Diastology LVIDs:         2.60 cm     LV e' medial:    3.75 cm/s LV PW:         1.20 cm     LV E/e' medial:  21.3 LV IVS:        1.20 cm     LV e' lateral:   6.62 cm/s LVOT diam:     2.00 cm     LV E/e' lateral: 12.1 LV SV:         74 LV SV Index:   45 LVOT Area:     3.14 cm  LV Volumes (MOD) LV vol d, MOD A2C: 46.5 ml LV vol d, MOD A4C: 51.6 ml LV vol s, MOD A2C: 16.6 ml LV vol s, MOD A4C: 15.6 ml LV SV MOD A2C:     29.9 ml LV SV MOD A4C:     51.6 ml LV SV MOD BP:      33.1 ml RIGHT VENTRICLE RV S prime:     19.60 cm/s TAPSE (M-mode): 2.9 cm LEFT  ATRIUM             Index LA diam:        3.40 cm 2.09 cm/m LA Vol (A2C):   39.2 ml 24.08 ml/m LA Vol (A4C):   31.1 ml 19.10 ml/m LA Biplane Vol: 34.8 ml  21.37 ml/m  AORTIC VALVE                     PULMONIC VALVE AV Area (Vmax):    2.14 cm      PV Vmax:          1.04 m/s AV Area (Vmean):   2.05 cm      PV Vmean:         68.000 cm/s AV Area (VTI):     2.03 cm      PV VTI:           0.184 m AV Vmax:           170.00 cm/s   PV Peak grad:     4.4 mmHg AV Vmean:          114.000 cm/s  PV Mean grad:     2.5 mmHg AV VTI:            0.362 m       PR End Diast Vel: 3.10 msec AV Peak Grad:      11.6 mmHg AV Mean Grad:      6.0 mmHg LVOT Vmax:         116.00 cm/s LVOT Vmean:        74.400 cm/s LVOT VTI:          0.234 m LVOT/AV VTI ratio: 0.65 AI PHT:            572 msec  AORTA Ao Asc diam: 3.00 cm MITRAL VALVE MV Area (PHT): 3.65 cm     SHUNTS MV Decel Time: 208 msec     Systemic VTI:  0.23 m MV E velocity: 79.80 cm/s   Systemic Diam: 2.00 cm MV A velocity: 142.00 cm/s MV E/A ratio:  0.56 Dalton McleanMD Electronically signed by Franki Monte Signature Date/Time: 11/17/2021/3:34:20 PM    Final     PHYSICAL EXAM  Physical Exam  Constitutional: Appears well-developed and well-nourished.  Pleasant elderly Caucasian lady Cardiovascular: Normal rate and regular rhythm.  Respiratory: Effort normal, non-labored breathing  Neuro: Mental Status: Patient is awake, alert, oriented to person, place, month, year, and situation. Vertigo/dizziness/nausea is improved.  Patient is able to give a clear and coherent history. No signs of aphasia or neglect Cranial Nerves: II: Visual Fields are full. Pupils are equal, round, and reactive to light.   III,IV, VI: EOMI without ptosis or diploplia.  V: Facial sensation is symmetric to temperature VII: Facial movement is symmetric resting and smiling VIII: Hearing is intact to voice X: Palate elevates symmetrically XI: Shoulder shrug is symmetric. XII: Tongue protrudes midline without atrophy or fasciculations.  Motor: Tone is normal. Bulk is normal. 5/5 strength was present in all four extremities.  Sensory: Slight  diminished sensation in left upper extremity  Cerebellar: Ataxia with left FTN- improving, but intact knee to heel coordination  ASSESSMENT/PLAN [name] is a 76 y.o. female with history of hypertension, hyperlipidemia, prediabetes, medication nonadherence, SVT, anxiety/depression, Mnire's disease, remote smoking history presenting with dizziness, left arm weakness, nausea, and headache. Woke up at 2am on 5/23 and was dizzy. She was fine on 5/22 when she went to bed. MRI  shows left PICA infarct and MRA shows moderate to severe stenosis and progressive intracranial atherosclerosis. She is still experiences some dizziness, however it is better when she is still. PT recommending outpatient vestibular therapy. 2D echo shows EF 65-70%.   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 Progression since previous study. MRI  acute or subacute infarcts in the PICA territory in the left mid and inferior cerebellum  MRA Left PICA occlusion, Progressive intracranial atherosclerotic disease compared to the prior exam, with moderate to severe stenosis in the distal right ICA and moderate multifocal narrowing in the right M1 and M2 branches. 2D Echo [id number], grade 1 diastolic dysfunction LDL [id number] HgbA1c 6.3 VTE prophylaxis -Lovenox    Diet   Diet regular Room service appropriate? Yes; Fluid consistency: Thin   aspirin 81 mg daily prior to admission, now on aspirin 81 mg daily and clopidogrel 75 mg daily for 3 weeks and then ASA 81mg  alone.  Therapy recommendations:  Vestibular therapy outpatient Disposition:  Home   Hypertension Home meds:  Noravsc Stable Permissive hypertension (OK if < 220/120) but gradually normalize in 5-7 days Long-term BP goal normotensive  Hyperlipidemia LDL 154, goal < 70 Add Crestor   High  intensity statin not indicated  Continue statin at discharge  Other Stroke Risk Factors Advanced Age >/= 76   Other Active Problems Nausea/vomiting Improving, as needed antiemetics.  Remains on clear liquid diet- primary team plans to slowly advance Anxiety/depression Continue BuSpar for now-probably will benefit from SSRI initiation prior to discharge. History of Mnire's disease  Hospital day # 1  Patient seen and examined by NP/APP with MD. MD to update note as needed.   Janine Ores, DNP, FNP-BC [facility] Pager: [phone]  I have personally obtained history,examined this patient, reviewed notes, independently viewed imaging studies, participated in medical decision making and plan of care.ROS completed by me personally and pertinent positives fully documented  I have made any additions or clarifications directly to the above note. Agree with note above.  Recommend aspirin and Plavix for 3 weeks followed by aspirin alone and aggressive risk factor modification.  Outpatient cardiac monitoring for 30 days for paroxysmal A-fib.  Patient counseled to be compliant with medications and physician follow-ups and seems agreeable.  Discussed with Dr.Ghimire.  Stroke team will sign off.  Follow-up with an outpatient stroke clinic in 2 months.  Greater than 50% time during this 35-minute visit were spent in counseling and coordination of care and discussion with patient and care team and answering questions about his stroke  Antony Contras, [place] Pager: [phone] 11/18/2021 1:53 PM   To contact Stroke Continuity provider, please refer to [email or web address] After hours, contact General Neurology

## 2021-11-18 NOTE — Plan of Care (Signed)
  Problem: Education: °Goal: Knowledge of General Education information will improve °Description: Including pain rating scale, medication(s)/side effects and non-pharmacologic comfort measures °Outcome: Adequate for Discharge °  °Problem: Health Behavior/Discharge Planning: °Goal: Ability to manage health-related needs will improve °Outcome: Adequate for Discharge °  °Problem: Clinical Measurements: °Goal: Ability to maintain clinical measurements within normal limits will improve °Outcome: Adequate for Discharge °Goal: Will remain free from infection °Outcome: Adequate for Discharge °Goal: Diagnostic test results will improve °Outcome: Adequate for Discharge °Goal: Respiratory complications will improve °Outcome: Adequate for Discharge °Goal: Cardiovascular complication will be avoided °Outcome: Adequate for Discharge °  °Problem: Activity: °Goal: Risk for activity intolerance will decrease °Outcome: Adequate for Discharge °  °Problem: Nutrition: °Goal: Adequate nutrition will be maintained °Outcome: Adequate for Discharge °  °Problem: Coping: °Goal: Level of anxiety will decrease °Outcome: Adequate for Discharge °  °Problem: Elimination: °Goal: Will not experience complications related to bowel motility °Outcome: Adequate for Discharge °Goal: Will not experience complications related to urinary retention °Outcome: Adequate for Discharge °  °Problem: Pain Managment: °Goal: General experience of comfort will improve °Outcome: Adequate for Discharge °  °Problem: Safety: °Goal: Ability to remain free from injury will improve °Outcome: Adequate for Discharge °  °Problem: Skin Integrity: °Goal: Risk for impaired skin integrity will decrease °Outcome: Adequate for Discharge °  °Problem: Education: °Goal: Knowledge of disease or condition will improve °Outcome: Adequate for Discharge °Goal: Knowledge of secondary prevention will improve (SELECT ALL) °Outcome: Adequate for Discharge °Goal: Knowledge of patient specific  risk factors will improve (INDIVIDUALIZE FOR PATIENT) °Outcome: Adequate for Discharge °Goal: Individualized Educational Video(s) °Outcome: Adequate for Discharge °  °Problem: Coping: °Goal: Will verbalize positive feelings about self °Outcome: Adequate for Discharge °Goal: Will identify appropriate support needs °Outcome: Adequate for Discharge °  °Problem: Health Behavior/Discharge Planning: °Goal: Ability to manage health-related needs will improve °Outcome: Adequate for Discharge °  °Problem: Self-Care: °Goal: Ability to participate in self-care as condition permits will improve °Outcome: Adequate for Discharge °Goal: Verbalization of feelings and concerns over difficulty with self-care will improve °Outcome: Adequate for Discharge °  °Problem: Ischemic Stroke/TIA Tissue Perfusion: °Goal: Complications of ischemic stroke/TIA will be minimized °Outcome: Adequate for Discharge °  °

## 2021-11-18 NOTE — Care Management (Signed)
    Durable Medical Equipment  (From admission, onward)           Start     Ordered   11/18/21 1009  DME 3-in-1  Once        11/18/21 1009   11/18/21 1009  DME Walker  Once       Question Answer Comment  Walker: With 5 Inch Wheels   Patient needs a walker to treat with the following condition Physical deconditioning      11/18/21 1009

## 2021-11-18 NOTE — Progress Notes (Signed)
Physical Therapy Treatment Patient Details Name: [name] MRN: [id number] DOB: 1945/11/30 Today's Date: 11/18/2021   History of Present Illness [name] is a 76 y.o. female admitted with LUE ataxia and weakness.  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  Also with recent left knee meniscus tear.    PT Comments    Pt admitted with above diagnosis. Pt was able to ambulate with min guard assist and RW and husband feels comfortable caring for pt at home at current level. Issued gait belt and handout given for progression of vertigo exercises. All education completed with pt and husband and they are anxious to go home and celebrate their 55th wedding anniversary.  Pt most likely will d/c today with Outpt PT and OT f/u. Pt currently with functional limitations due to balance and endurance deficits. Pt will benefit from skilled PT to increase their independence and safety with mobility to allow discharge to the venue listed below.      Recommendations for follow up therapy are one component of a multi-disciplinary discharge planning process, led by the attending physician.  Recommendations may be updated based on patient status, additional functional criteria and insurance authorization.  Follow Up Recommendations  Outpatient PT (vestibular therapy)     Assistance Recommended at Discharge Intermittent Supervision/Assistance  Patient can return home with the following A little help with walking and/or transfers;Help with stairs or ramp for entrance   Equipment Recommendations  Rolling walker (2 wheels);BSC/3in1    Recommendations for Other Services       Precautions / Restrictions Precautions Precautions: Fall Precaution Comments: monitor  BP Restrictions Weight Bearing Restrictions: Yes LLE Weight Bearing: Weight bearing as tolerated     Mobility  Bed Mobility Overal bed mobility: Modified Independent                  Transfers Overall transfer level: Needs assistance Equipment used: Rolling walker (2 wheels) Transfers: Sit to/from Stand Sit to Stand: Min guard           General transfer comment: Min instructional cueing for not pulling up on the walker with sit to stand.    Ambulation/Gait Ambulation/Gait assistance: Min guard Gait Distance (Feet): 200 Feet Assistive device: Rolling walker (2 wheels), None Gait Pattern/deviations: Step-through pattern, Decreased stride length, Decreased weight shift to left, Drifts right/left   Gait velocity interpretation: <1.31 ft/sec, indicative of household ambulator   General Gait Details: Overall pt ambulating with min guard assist with and without RW.  A little more tentative gait without RW however no LOB noted.  No ataxia noted as well. Pt could not perform head turns without dizziness and a little drift of which pt self corrected.  Pt does not weight bear as much of left knee due to meniscal tear that she was being treated for by MD PTA.  Husband educated on how to guard pt and issued gait belt.  Pt got dressed prior to PT leaving room. Needed min guard assist for dressing. Also walked to sink with guard assist and combed her hair.   Stairs             Wheelchair Mobility    Modified Rankin (Stroke Patients Only) Modified Rankin (Stroke Patients Only) Pre-Morbid Rankin Score: Moderate disability Modified Rankin: Moderately severe disability  Balance Overall balance assessment: Needs assistance Sitting-balance support: Feet supported Sitting balance-Leahy Scale: Good     Standing balance support: During functional activity Standing balance-Leahy Scale: Fair Standing balance comment: Static and dynamic activity with min guard assist to min  assist             High level balance activites: Head turns, Turns, Direction changes High Level Balance Comments: Min guard to min assit for these tasks            Cognition Arousal/Alertness: Awake/alert Behavior During Therapy: WFL for tasks assessed/performed Overall Cognitive Status: Within Functional Limits for tasks assessed                                          Exercises Other Exercises Other Exercises: x1 exercises initiated and pt to perform a set every 2 hours.    General Comments General comments (skin integrity, edema, etc.): Reviewed x1 exercises and performed some in standing.  Pt needs guard assist and husband educated in guarding pt for exercise.  Vestibular handout progression given to pt and husband.      Pertinent Vitals/Pain Pain Assessment Pain Assessment: Faces Faces Pain Scale: Hurts a little bit Pain Location: left knee with mobility Pain Descriptors / Indicators: Discomfort Pain Intervention(s): Limited activity within patient's tolerance, Monitored during session, Repositioned    Home Living                          Prior Function            PT Goals (current goals can now be found in the care plan section) Acute Rehab PT Goals Patient Stated Goal: to go home Progress towards PT goals: Progressing toward goals    Frequency    Min 4X/week      PT Plan Current plan remains appropriate    Co-evaluation              AM-PAC PT "6 Clicks" Mobility   Outcome Measure  Help needed turning from your back to your side while in a flat bed without using bedrails?: None Help needed moving from lying on your back to sitting on the side of a flat bed without using bedrails?: None Help needed moving to and from a bed to a chair (including a wheelchair)?: A Little Help needed standing up from a chair using your arms (e.g., wheelchair or bedside chair)?: A Little Help needed to walk in hospital room?: A  Little Help needed climbing 3-5 steps with a railing? : A Lot 6 Click Score: 19    End of Session Equipment Utilized During Treatment: Gait belt Activity Tolerance: Patient tolerated treatment well Patient left: in bed;with call bell/phone within reach;with family/visitor present (sitting EOB) Nurse Communication: Mobility status PT Visit Diagnosis: Unsteadiness on feet (R26.81);Muscle weakness (generalized) (M62.81);Dizziness and giddiness (R42)     Time: [id number] PT Time Calculation (min) (ACUTE ONLY): 32 min  Charges:  $Gait Training: 8-22 mins $Therapeutic Exercise: 8-22 mins                     [name],PT Acute Rehab Services [phone] (pager)    [name] 11/18/2021, 11:39 AM

## 2021-11-18 NOTE — Progress Notes (Signed)
Occupational Therapy Treatment Patient Details Name: [name] MRN: [id number] DOB: 03-27-1946 Today's Date: 11/18/2021   History of present illness [name] is a 76 y.o. female admitted with LUE ataxia and weakness.  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  Also with recent left knee meniscus tear.   OT comments  Pt completes selfcare tasks sit to stand as well as toilet transfers at modified independent.  She still exhibits increased dizziness with head movements, but this has improved since yesterday.  LUE ataxia is also still present but slightly improved.  Feel she will continue to benefit from acute care OT to address these deficits in order to reach independent level for home.     Recommendations for follow up therapy are one component of a multi-disciplinary discharge planning process, led by the attending physician.  Recommendations may be updated based on patient status, additional functional criteria and insurance authorization.    Follow Up Recommendations  Outpatient OT    Assistance Recommended at Discharge PRN  Patient can return home with the following  Assistance with cooking/housework;Assist for transportation   Equipment Recommendations  BSC/3in1       Precautions / Restrictions Precautions Precautions: Fall Precaution Comments: monitor BP Restrictions Weight Bearing Restrictions: No LLE Weight Bearing: Weight bearing as tolerated       Mobility Bed Mobility Overal bed mobility: Modified Independent                  Transfers Overall transfer level: Modified independent Equipment used: Rolling walker (2 wheels) Transfers: Sit to/from Stand Sit to Stand: Modified independent (Device/Increase time)                  Balance Overall balance assessment: Needs assistance Sitting-balance support: No upper extremity supported Sitting balance-Leahy Scale: Good     Standing balance support: During functional activity Standing balance-Leahy Scale: Good                             ADL either performed or assessed with clinical judgement   ADL                       Lower Body Dressing: Modified independent Lower Body Dressing Details (indicate cue type and reason): for donning gripper socks Toilet Transfer: Modified Independent;Rolling walker (2 wheels);Ambulation Toilet Transfer Details (indicate cue type and reason): simulation         Functional mobility during ADLs: Modified independent;Rolling walker (2 wheels) General ADL Comments: Pt without nausea this session.  Still with slight uneasy/dizziness with supine to sit and with functional mobility when completing slight head turns.  Educated pt on FM coordination exercises for the LUE to help with ataxia.  Handout provided for reference as well.               Cognition Arousal/Alertness: Awake/alert Behavior During Therapy: WFL for tasks assessed/performed Overall Cognitive Status: Within Functional Limits for tasks assessed                                                General Comments Reviewed x1 exercises  and performed some in standing.  Pt needs guard assist and husband educated in guarding pt for exercise.  Vestibular handout progression given to pt and husband.    Pertinent Vitals/ Pain       Pain Assessment Pain Assessment: No/denies pain         Frequency  Min 2X/week        Progress Toward Goals  OT Goals(current goals can now be found in the care plan section)  Progress towards OT goals: Progressing toward goals  Acute Rehab OT Goals OT Goal Formulation: With patient/family Time For Goal Achievement: 12/01/21 Potential to Achieve Goals: Good  Plan  Discharge plan remains appropriate       AM-PAC OT "6 Clicks" Daily Activity     Outcome Measure   Help from another person eating meals?: None Help from another person taking care of personal grooming?: None Help from another person toileting, which includes using toliet, bedpan, or urinal?: A Little Help from another person bathing (including washing, rinsing, drying)?: A Little Help from another person to put on and taking off regular upper body clothing?: None Help from another person to put on and taking off regular lower body clothing?: A Little 6 Click Score: 21    End of Session Equipment Utilized During Treatment: Gait belt;Rolling walker (2 wheels)  OT Visit Diagnosis: Unsteadiness on feet (R26.81);Hemiplegia and hemiparesis;Ataxia, unspecified (R27.0) Hemiplegia - dominant/non-dominant: Non-Dominant Hemiplegia - caused by: Cerebral infarction   Activity Tolerance Patient tolerated treatment well   Patient Left in bed;with family/visitor present   Nurse Communication Mobility status        Time: 8921-1941 OT Time Calculation (min): 33 min  Charges: OT General Charges $OT Visit: 1 Visit OT Treatments $Self Care/Home Management : 8-22 mins $Therapeutic Activity: 8-22 mins  Syrai Gladwin OTR/L 11/18/2021, 12:33 PM

## 2021-11-18 NOTE — TOC Transition Note (Addendum)
Transition of Care [facility]) - CM/SW Discharge Note   Patient Details  Name: KRESTA TEMPLEMAN MRN: [id number] Date of Birth: 07/05/1945  Transition of Care [facility]) CM/SW Contact:  Lawerance Sabal, RN Phone Number: 11/18/2021, 10:17 AM   Clinical Narrative:   Spoke with patient yesterday to discuss loaction for outpatient therapies, [facility] chosen. Referral placed for vestibular PT OT SLP RW and 3/1 to be delivered to the room shortly by [employer].     Final next level of care: Home/Self Care Barriers to Discharge: No Barriers Identified   Patient Goals and CMS Choice Patient states their goals for this hospitalization and ongoing recovery are:: to go home CMS Medicare.gov Compare Post Acute Care list provided to:: Patient Choice offered to / list presented to : Patient  Discharge Placement                       Discharge Plan and Services                DME Arranged: 3-N-1, Walker rolling DME Agency: [employer] Date DME Agency Contacted: 11/18/21 Time DME Agency Contacted: [phone] Representative spoke with at DME Agency: [place]            Social Determinants of Health (SDOH) Interventions     Readmission Risk Interventions     View : No data to display.

## 2021-11-18 NOTE — Discharge Summary (Signed)
PATIENT DETAILS Name: [name] Age: 76 y.o. Sex: female Date of Birth: [birth year] MRN: [id number]. Admitting Physician: Jonetta Osgood, MD [id number], Jori Moll, MD  Admit Date: 11/16/2021 Discharge date: 11/18/2021  Recommendations for Outpatient Follow-up:  Follow up with PCP in 1-2 weeks Please obtain CMP/CBC in one week Please ensure follow-up with stroke MD.  Admitted From:  Home  Disposition: Home   Discharge Condition: fair  CODE STATUS:   Code Status: Full Code   Diet recommendation:  Diet Order             Diet regular Room service appropriate? 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   Significant events: 5/23>> nausea/vomiting/left hand clumsiness/vertigo-acute PICA CVA.   Significant studies: 5/23>> CT head: No acute intracranial abnormality. 5/23>> MRI brain: Acute/subacute infarct in the left PICA territory in the left mid/inferior cerebellum. 5/23>> MRA brain: Loss of signal in the left PICA-progressive intracranial atherosclerotic disease compared to the prior exam with moderate to severe stenosis of the distal right ICA, moderate multifocal narrowing of right M1 M2 branches. 5/23>> MRA neck: No hemodynamically significant stenosis. 5/24>> LDL: 154 5/24>> A1c: 6.3 5/24>> Echo: EF [id number], grade 1 diastolic dysfunction   Significant microbiology data:     Procedures: None   Consults: Neurology  Brief Hospital Course: Acute left PICA CVA: Overall better-less vertigo-no nausea overnight-left hand is not as clumsy as before.  Work-up as above.  Evaluated by neurology-recommendations are aspirin/Plavix x3 weeks followed by aspirin alone.  Has been started on high  intensity statin.  Cardiology will arrange for outpatient heart monitor.  Please ensure patient follows with neurology in the outpatient setting.     Hypertensive urgency: In the setting of acute CVA-initially permissive hypertension was allowed-continue amlodipine on discharge-follow-up with primary MD for further optimization in the outpatient setting.     HLD: Will be on statin.   Nausea/vomiting: Related to cerebellar stroke/vertigo-improving-continue as needed antiemetics.  Was able to tolerate regular diet prior to discharge.    History of anxiety/depression: Discussed with patient-she does not want to be started on an SSRI at this point-if she changes her mind-she will let her primary care practitioner now.  Her anxiety seems to be under control currently-given her vertigo-avoiding use of benzos-as she already has some amount of unsteady gait and will increase her fall risk.   History of Mnire's disease   BMI: Estimated body mass index is 28.32 kg/m as calculated from the following:   Height as of this encounter: 5' (1.524 m).   Weight as of this encounter: 65.8 kg.   Discharge Diagnoses:  Principal Problem:   Hypertensive urgency Active Problems:   Dizziness and giddiness   Essential hypertension   History of CVA (cerebrovascular accident)   Mixed hyperlipidemia   Paroxysmal atrial tachycardia (HCC)   Focal neurological deficit   Acute CVA (cerebrovascular accident) [facility])   Discharge Instructions:  Activity:  As tolerated with Full fall precautions use walker/cane & assistance as needed   Discharge Instructions     Ambulatory referral to Neurology   Complete by: As directed    An appointment is requested  in approximately: 8 weeks   Ambulatory referral to Occupational Therapy   Complete by: As directed    Ambulatory referral to Physical Therapy   Complete by: As directed    Ambulatory referral to Speech Therapy   Complete by: As directed    Call MD for:   extreme fatigue   Complete by: As directed    Call MD for:  persistant dizziness or light-headedness   Complete by: As directed    Call MD for:  persistant nausea and vomiting   Complete by: As directed    Diet - low sodium heart healthy   Complete by: As directed    Discharge instructions   Complete by: As directed    Follow with Primary MD  Seward Carol, MD in 1-2 weeks  Please follow-up with stroke clinic in the next few weeks-you should be getting a call from them.  Take aspirin/Plavix x3 weeks-then aspirin alone.  Please get a complete blood count and chemistry panel checked by your Primary MD at your next visit, and again as instructed by your Primary MD.  Get Medicines reviewed and adjusted: Please take all your medications with you for your next visit with your Primary MD  Laboratory/radiological data: Please request your Primary MD to go over all hospital tests and procedure/radiological results at the follow up, please ask your Primary MD to get all Hospital records sent to his/her office.  In some cases, they will be blood work, cultures and biopsy results pending at the time of your discharge. Please request that your primary care M.D. follows up on these results.  Also Note the following: If you experience worsening of your admission symptoms, develop shortness of breath, life threatening emergency, suicidal or homicidal thoughts you must seek medical attention immediately by calling 911 or calling your MD immediately  if symptoms less severe.  You must read complete instructions/literature along with all the possible adverse reactions/side effects for all the Medicines you take and that have been prescribed to you. Take any new Medicines after you have completely understood and accpet all the possible adverse reactions/side effects.   Do not drive when taking Pain medications or sleeping medications (Benzodaizepines)  Do not take more than prescribed Pain, Sleep and  Anxiety Medications. It is not advisable to combine anxiety,sleep and pain medications without talking with your primary care practitioner  Special Instructions: If you have smoked or chewed Tobacco  in the last 2 yrs please stop smoking, stop any regular Alcohol  and or any Recreational drug use.  Wear Seat belts while driving.  Please note: You were cared for by a hospitalist during your hospital stay. Once you are discharged, your primary care physician will handle any further medical issues. Please note that NO REFILLS for any discharge medications will be authorized once you are discharged, as it is imperative that you return to your primary care physician (or establish a relationship with a primary care physician if you do not have one) for your post hospital discharge needs so that they can reassess your need for medications and monitor your lab values.   Increase activity slowly   Complete by: As directed       Allergies as of 11/18/2021       Reactions   Epinephrine Itching   Contrast Media [iodinated Contrast Media] Itching   Pt had itchy nose and dry throat and was told she was allergic to IV contrast and does not want to have it. PT DENIES HAVING  A CONTRAST ALLERGY   Keflex [cephalexin] Swelling        Medication List     STOP taking these medications    diazepam 5 MG tablet Commonly known as: VALIUM   meclizine 12.5 MG tablet Commonly known as: ANTIVERT   metoprolol tartrate 100 MG tablet Commonly known as: LOPRESSOR   ondansetron 4 MG tablet Commonly known as: ZOFRAN   predniSONE 50 MG tablet Commonly known as: DELTASONE   Vitamin D3 50 MCG (2000 UT) Tabs       TAKE these medications    amLODipine 5 MG tablet Commonly known as: NORVASC Take 1 tablet (5 mg total) by mouth daily. What changed:  medication strength how much to take   aspirin EC 81 MG tablet Take 1 tablet (81 mg total) by mouth daily. Swallow whole. What changed:  when to take  this reasons to take this   clopidogrel 75 MG tablet Commonly known as: PLAVIX Take 1 tablet (75 mg total) by mouth daily. Take along with aspirin for 21 days-after 21 days stop Plavix and continue on aspirin. Start taking on: Nov 19, 2021   ondansetron 4 MG disintegrating tablet Commonly known as: ZOFRAN-ODT Take 1 tablet (4 mg total) by mouth every 8 (eight) hours as needed for nausea or vomiting.   rosuvastatin 40 MG tablet Commonly known as: CRESTOR Take 1 tablet (40 mg total) by mouth daily. Start taking on: Nov 19, 2021               Durable Medical Equipment  (From admission, onward)           Start     Ordered   Unscheduled  DME 3-in-1  Once        11/18/21 1009   Unscheduled  DME Walker  Once       Question Answer Comment  Walker: With 5 Inch Wheels   Patient needs a walker to treat with the following condition Physical deconditioning      11/18/21 1009            [facility]. Call.   Specialty: Rehabilitation Why: A referral has been made electronically. They will call you in the next 5 days to schedule PT OT and Speech therapy. You may also call to expedite the scheduling of your appointment Contact information: [address] [place]        Garvin Fila, MD. Schedule an appointment as soon as possible for a visit in 8 week(s).   Specialties: Neurology, Radiology Contact information: [address] [place] [address] [phone]         Seward Carol, MD. Schedule an appointment as soon as possible for a visit in 1 week(s).   Specialty: Internal Medicine Contact information: [address] [place] [address] [phone]                Allergies  Allergen Reactions   Epinephrine Itching   Contrast Media [Iodinated Contrast Media] Itching    Pt had itchy  nose and dry throat and was told she was allergic to IV contrast and does not want to have it.  PT DENIES HAVING A CONTRAST ALLERGY   Keflex [Cephalexin] Swelling     Other Procedures/Studies: CT Head Wo Contrast  Result Date: 11/16/2021 CLINICAL DATA:  Acute neurological deficit with stroke suspected. EXAM: CT HEAD WITHOUT CONTRAST TECHNIQUE: Contiguous  axial images were obtained from the base of the skull through the vertex without intravenous contrast. RADIATION DOSE REDUCTION: This exam was performed according to the departmental dose-optimization program which includes automated exposure control, adjustment of the mA and/or kV according to patient size and/or use of iterative reconstruction technique. COMPARISON:  CT 06/11/2015.  MRI 12/19/2019 FINDINGS: Brain: Mild diffuse cerebral atrophy. No ventricular dilatation. Multiple focal areas of low-attenuation demonstrated throughout the deep and periventricular white matter, in the left cerebellum, and in the pons. These likely represent old lacunar infarcts. These have progressed since the prior studies. No mass effect or midline shift. No abnormal extra-axial fluid collections. Gray-white matter junctions are distinct. Basal cisterns are not effaced. No acute intracranial hemorrhage. Vascular: Intracranial arterial vascular calcifications are present. Skull: Calvarium appears intact. Sinuses/Orbits: Paranasal sinuses and mastoid air cells are clear. Other: None. 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 There is progression since previous study. No acute intracranial hemorrhage or mass effect. Electronically Signed   By: Lucienne Capers M.D.   On: 11/16/2021 16:48   MR ANGIO HEAD WO CONTRAST  Result Date: 11/16/2021 CLINICAL DATA:  Dizziness, unsteady gait, left arm disc coordination, stroke suspected EXAM: MRI HEAD WITHOUT CONTRAST MRA HEAD WITHOUT  CONTRAST MRA NECK WITHOUT CONTRAST TECHNIQUE: Multiplanar, multi-echo pulse sequences of the brain and surrounding structures were acquired without intravenous contrast. Angiographic images of the Circle of Willis were acquired using MRA technique without intravenous contrast. Angiographic images of the neck were acquired using MRA technique without intravenous contrast. Carotid stenosis measurements (when applicable) are obtained utilizing NASCET criteria, using the distal internal carotid diameter as the denominator. The patient refused contrast. COMPARISON:  06/12/2015 MRI and MRA head clinical correlation is also made with 11/16/2021 CT head FINDINGS: MRI HEAD FINDINGS Brain: Two somewhat wedge-shaped areas of restricted diffusion with ADC correlate in the mid and inferior left cerebellum (series 5, image 60 and series 6, image 10; series 5, image 56 and series 6, image 6). These areas are associated with minimally increased T2 hyperintense signal. No acute hemorrhage, mass, mass effect, or midline shift. No hydrocephalus or extra-axial collection. Lacunar infarcts in the bilateral basal ganglia and pons. Vascular: Please see MRA findings below. Skull and upper cervical spine: Normal marrow signal. Degenerative changes in the cervical spine with trace anterolisthesis and a small disc bulge at C3-C4, which indents the ventral spinal cord. Sinuses/Orbits: No acute finding. Status post left lens replacement. Other: No acute intracranial process. MRA HEAD FINDINGS Anterior circulation: Both internal carotid arteries are patent to the termini, with increased stenosis in the right distal ICA, near the terminus, with multifocal moderate to severe stenosis (series 1, images 89, 91, and 95). A1 segments patent. Normal anterior communicating artery. Anterior cerebral arteries are patent to their distal aspects. Moderate multifocal narrowing of the right M1 segment (series 1, image 100), which has progressed from the prior  exam. Moderate multifocal narrowing in the right M2 branches (for example series 1, image 102 and 92), new from the prior exam, with similar additional multifocal irregularity and poor signal in the more distal right MCA branches. Redemonstrated duplicate left MCA with mild irregularity in the more proximal M1 segment but no significant stenosis. Distal left MCA branches perfused. Posterior circulation: Vertebral arteries patent to the vertebrobasilar junction without stenosis. Posterior inferior cerebral arteries patent proximally, with no signal in the left PICA just distal to its takeoff (series 1, images 22-24), without  evidence of reconstitution, new from the prior exam. The visualized right PICA is patent. Basilar patent to its distal aspect, with a small fenestration proximally. Superior cerebellar arteries patent bilaterally. Patent right P1 segment. Fetal origin of the left PCA from a patent left posterior communicating artery. Moderate stenosis in the mid right P2 (series 1, images 103-108), unchanged. PCAs otherwise perfused to their distal aspects without stenosis. The left posterior communicating artery is not visualized. Anatomic variants: Fetal origin of the left PCA. MRA NECK FINDINGS Aortic arch: Two-vessel arch with a common origin of the brachiocephalic and left common carotid arteries. Imaged portion shows no evidence of aneurysm or dissection. No significant stenosis of the major arch vessel origins. Right carotid system: No evidence of dissection, occlusion, or hemodynamically significant stenosis (greater than 50%). Left carotid system: No evidence of dissection, occlusion, or hemodynamically significant stenosis (greater than 50%). Vertebral arteries: No evidence of dissection, occlusion, or hemodynamically significant stenosis (greater than 50%). Other: None IMPRESSION: 1. Acute or subacute infarcts in the PICA territory in the left mid and inferior cerebellum, with loss of signal in the  left PICA just distal to its takeoff, which is new compared to 2016 and concerning for occlusion. No evidence of hemorrhage, mass effect, or midline shift. 2. Progressive intracranial atherosclerotic disease compared to the prior exam, with moderate to severe stenosis in the distal right ICA and moderate multifocal narrowing in the right M1 and M2 branches. Other previously noted stenoses are overall unchanged. 3.  No hemodynamically significant stenosis in the neck. These results will be called to the ordering clinician or representative by the Radiologist Assistant, and communication documented in the PACS or [employer]. Electronically Signed   By: Merilyn Baba M.D.   On: 11/16/2021 22:04   MR ANGIO NECK WO CONTRAST  Result Date: 11/16/2021 CLINICAL DATA:  Dizziness, unsteady gait, left arm disc coordination, stroke suspected EXAM: MRI HEAD WITHOUT CONTRAST MRA HEAD WITHOUT CONTRAST MRA NECK WITHOUT CONTRAST TECHNIQUE: Multiplanar, multi-echo pulse sequences of the brain and surrounding structures were acquired without intravenous contrast. Angiographic images of the Circle of Willis were acquired using MRA technique without intravenous contrast. Angiographic images of the neck were acquired using MRA technique without intravenous contrast. Carotid stenosis measurements (when applicable) are obtained utilizing NASCET criteria, using the distal internal carotid diameter as the denominator. The patient refused contrast. COMPARISON:  06/12/2015 MRI and MRA head clinical correlation is also made with 11/16/2021 CT head FINDINGS: MRI HEAD FINDINGS Brain: Two somewhat wedge-shaped areas of restricted diffusion with ADC correlate in the mid and inferior left cerebellum (series 5, image 60 and series 6, image 10; series 5, image 56 and series 6, image 6). These areas are associated with minimally increased T2 hyperintense signal. No acute hemorrhage, mass, mass effect, or midline shift. No hydrocephalus or  extra-axial collection. Lacunar infarcts in the bilateral basal ganglia and pons. Vascular: Please see MRA findings below. Skull and upper cervical spine: Normal marrow signal. Degenerative changes in the cervical spine with trace anterolisthesis and a small disc bulge at C3-C4, which indents the ventral spinal cord. Sinuses/Orbits: No acute finding. Status post left lens replacement. Other: No acute intracranial process. MRA HEAD FINDINGS Anterior circulation: Both internal carotid arteries are patent to the termini, with increased stenosis in the right distal ICA, near the terminus, with multifocal moderate to severe stenosis (series 1, images 89, 91, and 95). A1 segments patent. Normal anterior communicating artery. Anterior cerebral arteries are patent to their distal aspects. Moderate  multifocal narrowing of the right M1 segment (series 1, image 100), which has progressed from the prior exam. Moderate multifocal narrowing in the right M2 branches (for example series 1, image 102 and 92), new from the prior exam, with similar additional multifocal irregularity and poor signal in the more distal right MCA branches. Redemonstrated duplicate left MCA with mild irregularity in the more proximal M1 segment but no significant stenosis. Distal left MCA branches perfused. Posterior circulation: Vertebral arteries patent to the vertebrobasilar junction without stenosis. Posterior inferior cerebral arteries patent proximally, with no signal in the left PICA just distal to its takeoff (series 1, images 22-24), without evidence of reconstitution, new from the prior exam. The visualized right PICA is patent. Basilar patent to its distal aspect, with a small fenestration proximally. Superior cerebellar arteries patent bilaterally. Patent right P1 segment. Fetal origin of the left PCA from a patent left posterior communicating artery. Moderate stenosis in the mid right P2 (series 1, images 103-108), unchanged. PCAs otherwise  perfused to their distal aspects without stenosis. The left posterior communicating artery is not visualized. Anatomic variants: Fetal origin of the left PCA. MRA NECK FINDINGS Aortic arch: Two-vessel arch with a common origin of the brachiocephalic and left common carotid arteries. Imaged portion shows no evidence of aneurysm or dissection. No significant stenosis of the major arch vessel origins. Right carotid system: No evidence of dissection, occlusion, or hemodynamically significant stenosis (greater than 50%). Left carotid system: No evidence of dissection, occlusion, or hemodynamically significant stenosis (greater than 50%). Vertebral arteries: No evidence of dissection, occlusion, or hemodynamically significant stenosis (greater than 50%). Other: None IMPRESSION: 1. 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 No evidence of hemorrhage, mass effect, or midline shift. 2. Progressive intracranial atherosclerotic disease compared to the prior exam, with moderate to severe stenosis in the distal right ICA and moderate multifocal narrowing in the right M1 and M2 branches. Other previously noted stenoses are overall unchanged. 3.  No hemodynamically significant stenosis in the neck. These results will be called to the ordering clinician or representative by the Radiologist Assistant, and communication documented in the PACS or [employer]. Electronically Signed   By: Merilyn Baba M.D.   On: 11/16/2021 22:04   MR BRAIN WO CONTRAST  Result Date: 11/16/2021 CLINICAL DATA:  Dizziness, unsteady gait, left arm disc coordination, stroke suspected EXAM: MRI HEAD WITHOUT CONTRAST MRA HEAD WITHOUT CONTRAST MRA NECK WITHOUT CONTRAST TECHNIQUE: Multiplanar, multi-echo pulse sequences of the brain and surrounding structures were acquired without  intravenous contrast. Angiographic images of the Circle of Willis were acquired using MRA technique without intravenous contrast. Angiographic images of the neck were acquired using MRA technique without intravenous contrast. Carotid stenosis measurements (when applicable) are obtained utilizing NASCET criteria, using the distal internal carotid diameter as the denominator. The patient refused contrast. COMPARISON:  06/12/2015 MRI and MRA head clinical correlation is also made with 11/16/2021 CT head FINDINGS: MRI HEAD FINDINGS Brain: Two somewhat wedge-shaped areas of restricted diffusion with ADC correlate in the mid and inferior left cerebellum (series 5, image 60 and series 6, image 10; series 5, image 56 and series 6, image 6). These areas are associated with minimally increased T2 hyperintense signal. No acute hemorrhage, mass, mass effect, or midline shift. No hydrocephalus or extra-axial collection. Lacunar infarcts in the bilateral  basal ganglia and pons. Vascular: Please see MRA findings below. Skull and upper cervical spine: Normal marrow signal. Degenerative changes in the cervical spine with trace anterolisthesis and a small disc bulge at C3-C4, which indents the ventral spinal cord. Sinuses/Orbits: No acute finding. Status post left lens replacement. Other: No acute intracranial process. MRA HEAD FINDINGS Anterior circulation: Both internal carotid arteries are patent to the termini, with increased stenosis in the right distal ICA, near the terminus, with multifocal moderate to severe stenosis (series 1, images 89, 91, and 95). A1 segments patent. Normal anterior communicating artery. Anterior cerebral arteries are patent to their distal aspects. Moderate multifocal narrowing of the right M1 segment (series 1, image 100), which has progressed from the prior exam. Moderate multifocal narrowing in the right M2 branches (for example series 1, image 102 and 92), new from the prior exam, with similar  additional multifocal irregularity and poor signal in the more distal right MCA branches. Redemonstrated duplicate left MCA with mild irregularity in the more proximal M1 segment but no significant stenosis. Distal left MCA branches perfused. Posterior circulation: Vertebral arteries patent to the vertebrobasilar junction without stenosis. Posterior inferior cerebral arteries patent proximally, with no signal in the left PICA just distal to its takeoff (series 1, images 22-24), without evidence of reconstitution, new from the prior exam. The visualized right PICA is patent. Basilar patent to its distal aspect, with a small fenestration proximally. Superior cerebellar arteries patent bilaterally. Patent right P1 segment. Fetal origin of the left PCA from a patent left posterior communicating artery. Moderate stenosis in the mid right P2 (series 1, images 103-108), unchanged. PCAs otherwise perfused to their distal aspects without stenosis. The left posterior communicating artery is not visualized. Anatomic variants: Fetal origin of the left PCA. MRA NECK FINDINGS Aortic arch: Two-vessel arch with a common origin of the brachiocephalic and left common carotid arteries. Imaged portion shows no evidence of aneurysm or dissection. No significant stenosis of the major arch vessel origins. Right carotid system: No evidence of dissection, occlusion, or hemodynamically significant stenosis (greater than 50%). Left carotid system: No evidence of dissection, occlusion, or hemodynamically significant stenosis (greater than 50%). Vertebral arteries: No evidence of dissection, occlusion, or hemodynamically significant stenosis (greater than 50%). Other: None IMPRESSION: 1. 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 No evidence of hemorrhage, mass effect, or  midline shift. 2. Progressive intracranial atherosclerotic disease compared to the prior exam, with moderate to severe stenosis in the distal right ICA and moderate multifocal narrowing in the right M1 and M2 branches. Other previously noted stenoses are overall unchanged. 3.  No hemodynamically significant stenosis in the neck. These results will be called to the ordering clinician or representative by the Radiologist Assistant, and communication documented in the PACS or [employer]. Electronically Signed   By: Merilyn Baba M.D.   On: 11/16/2021 22:04   ECHOCARDIOGRAM COMPLETE  Result Date: 11/17/2021    ECHOCARDIOGRAM REPORT   Patient Name:   [name] Date of Exam: 11/17/2021 Medical Rec #:  [id number]         Height:       60.0 in Accession #:    [id number]        Weight:       145.0 lb Date of Birth:  07-24-1945  BSA:          1.628 m Patient Age:    78 years          BP:           168/77 mmHg Patient Gender: F                 HR:           70 bpm. Exam Location:  Inpatient Procedure: 2D Echo, Cardiac Doppler and Color Doppler Indications:    TIA  History:        Patient has prior history of Echocardiogram examinations, most                 recent 06/12/2015. Arrythmias:Tachycardia and PVC,                 Signs/Symptoms:Syncope; Risk Factors:Dyslipidemia and                 Hypertension.  Sonographer:    Luisa Hart RDCS Referring Phys: Ursina  1. Left ventricular ejection fraction, by estimation, is 65 to 70%. The left ventricle has hyperdynamic function. The left ventricle has no regional wall motion abnormalities. There is moderate left ventricular hypertrophy. Left ventricular diastolic parameters are consistent with Grade I diastolic dysfunction (impaired relaxation).  2. Right ventricular systolic function is normal. The right ventricular size is normal. Tricuspid regurgitation signal is inadequate for assessing PA pressure.  3. The mitral valve is  normal in structure. No evidence of mitral valve regurgitation. No evidence of mitral stenosis.  4. The aortic valve is tricuspid. Aortic valve regurgitation is mild. No aortic stenosis is present.  5. The inferior vena cava is normal in size with <50% respiratory variability, suggesting right atrial pressure of 8 mmHg. FINDINGS  Left Ventricle: Left ventricular ejection fraction, by estimation, is 65 to 70%. The left ventricle has hyperdynamic function. The left ventricle has no regional wall motion abnormalities. The left ventricular internal cavity size was normal in size. There is moderate left ventricular hypertrophy. Left ventricular diastolic parameters are consistent with Grade I diastolic dysfunction (impaired relaxation). Right Ventricle: The right ventricular size is normal. No increase in right ventricular wall thickness. Right ventricular systolic function is normal. Tricuspid regurgitation signal is inadequate for assessing PA pressure. Left Atrium: Left atrial size was normal in size. Right Atrium: Right atrial size was normal in size. Pericardium: Trivial pericardial effusion is present. Mitral Valve: The mitral valve is normal in structure. No evidence of mitral valve regurgitation. No evidence of mitral valve stenosis. Tricuspid Valve: The tricuspid valve is normal in structure. Tricuspid valve regurgitation is not demonstrated. Aortic Valve: The aortic valve is tricuspid. Aortic valve regurgitation is mild. Aortic regurgitation PHT measures 572 msec. No aortic stenosis is present. Aortic valve mean gradient measures 6.0 mmHg. Aortic valve peak gradient measures 11.6 mmHg. Aortic valve area, by VTI measures 2.03 cm. Pulmonic Valve: The pulmonic valve was normal in structure. Pulmonic valve regurgitation is trivial. Aorta: The aortic root is normal in size and structure. Venous: The inferior vena cava is normal in size with less than 50% respiratory variability, suggesting right atrial pressure of  8 mmHg. IAS/Shunts: No atrial level shunt detected by color flow Doppler.  LEFT VENTRICLE PLAX 2D LVIDd:         4.40 cm     Diastology LVIDs:         2.60 cm     LV e' medial:  3.75 cm/s LV PW:         1.20 cm     LV E/e' medial:  21.3 LV IVS:        1.20 cm     LV e' lateral:   6.62 cm/s LVOT diam:     2.00 cm     LV E/e' lateral: 12.1 LV SV:         74 LV SV Index:   45 LVOT Area:     3.14 cm  LV Volumes (MOD) LV vol d, MOD A2C: 46.5 ml LV vol d, MOD A4C: 51.6 ml LV vol s, MOD A2C: 16.6 ml LV vol s, MOD A4C: 15.6 ml LV SV MOD A2C:     29.9 ml LV SV MOD A4C:     51.6 ml LV SV MOD BP:      33.1 ml RIGHT VENTRICLE RV S prime:     19.60 cm/s TAPSE (M-mode): 2.9 cm LEFT ATRIUM             Index LA diam:        3.40 cm 2.09 cm/m LA Vol (A2C):   39.2 ml 24.08 ml/m LA Vol (A4C):   31.1 ml 19.10 ml/m LA Biplane Vol: 34.8 ml 21.37 ml/m  AORTIC VALVE                     PULMONIC VALVE AV Area (Vmax):    2.14 cm      PV Vmax:          1.04 m/s AV Area (Vmean):   2.05 cm      PV Vmean:         68.000 cm/s AV Area (VTI):     2.03 cm      PV VTI:           0.184 m AV Vmax:           170.00 cm/s   PV Peak grad:     4.4 mmHg AV Vmean:          114.000 cm/s  PV Mean grad:     2.5 mmHg AV VTI:            0.362 m       PR End Diast Vel: 3.10 msec AV Peak Grad:      11.6 mmHg AV Mean Grad:      6.0 mmHg LVOT Vmax:         116.00 cm/s LVOT Vmean:        74.400 cm/s LVOT VTI:          0.234 m LVOT/AV VTI ratio: 0.65 AI PHT:            572 msec  AORTA Ao Asc diam: 3.00 cm MITRAL VALVE MV Area (PHT): 3.65 cm     SHUNTS MV Decel Time: 208 msec     Systemic VTI:  0.23 m MV E velocity: 79.80 cm/s   Systemic Diam: 2.00 cm MV A velocity: 142.00 cm/s MV E/A ratio:  0.56 Dalton McleanMD Electronically signed by Franki Monte Signature Date/Time: 11/17/2021/3:34:20 PM    Final      TODAY-DAY OF DISCHARGE:  Subjective:   [name] today has no headache,no chest abdominal pain,no new weakness tingling or numbness, feels  much better wants to go home today.   Objective:   Blood pressure (!) 167/82, pulse 66, temperature (!) 97.4 F (36.3 C), temperature source Oral, resp. rate 13, height 5' (1.524  m), weight 65.8 kg, SpO2 93 %.  Intake/Output Summary (Last 24 hours) at 11/18/2021 1009 Last data filed at 11/17/2021 1500 Gross per 24 hour  Intake 120 ml  Output --  Net 120 ml   Filed Weights   11/16/21 2155  Weight: 65.8 kg    Exam: Awake Alert, Oriented *3, No new F.N deficits, Normal affect Verdi.AT,PERRAL Supple Neck,No JVD, No cervical lymphadenopathy appriciated.  Symmetrical Chest wall movement, Good air movement bilaterally, CTAB RRR,No Gallops,Rubs or new Murmurs, No Parasternal Heave +ve B.Sounds, Abd Soft, Non tender, No organomegaly appriciated, No rebound -guarding or rigidity. No Cyanosis, Clubbing or edema, No new Rash or bruise   PERTINENT RADIOLOGIC STUDIES: CT Head Wo Contrast  Result Date: 11/16/2021 CLINICAL DATA:  Acute neurological deficit with stroke suspected. EXAM: CT HEAD WITHOUT CONTRAST TECHNIQUE: Contiguous axial images were obtained from the base of the skull through the vertex without intravenous contrast. RADIATION DOSE REDUCTION: This exam was performed according to the departmental dose-optimization program which includes automated exposure control, adjustment of the mA and/or kV according to patient size and/or use of iterative reconstruction technique. COMPARISON:  CT 06/11/2015.  MRI 12/19/2019 FINDINGS: Brain: Mild diffuse cerebral atrophy. No ventricular dilatation. Multiple focal areas of low-attenuation demonstrated throughout the deep and periventricular white matter, in the left cerebellum, and in the pons. These likely represent old lacunar infarcts. These have progressed since the prior studies. No mass effect or midline shift. No abnormal extra-axial fluid collections. Gray-white matter junctions are distinct. Basal cisterns are not effaced. No acute intracranial  hemorrhage. Vascular: Intracranial arterial vascular calcifications are present. Skull: Calvarium appears intact. Sinuses/Orbits: Paranasal sinuses and mastoid air cells are clear. Other: None. 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 There is progression since previous study. No acute intracranial hemorrhage or mass effect. Electronically Signed   By: Lucienne Capers M.D.   On: 11/16/2021 16:48   MR ANGIO HEAD WO CONTRAST  Result Date: 11/16/2021 CLINICAL DATA:  Dizziness, unsteady gait, left arm disc coordination, stroke suspected EXAM: MRI HEAD WITHOUT CONTRAST MRA HEAD WITHOUT CONTRAST MRA NECK WITHOUT CONTRAST TECHNIQUE: Multiplanar, multi-echo pulse sequences of the brain and surrounding structures were acquired without intravenous contrast. Angiographic images of the Circle of Willis were acquired using MRA technique without intravenous contrast. Angiographic images of the neck were acquired using MRA technique without intravenous contrast. Carotid stenosis measurements (when applicable) are obtained utilizing NASCET criteria, using the distal internal carotid diameter as the denominator. The patient refused contrast. COMPARISON:  06/12/2015 MRI and MRA head clinical correlation is also made with 11/16/2021 CT head FINDINGS: MRI HEAD FINDINGS Brain: Two somewhat wedge-shaped areas of restricted diffusion with ADC correlate in the mid and inferior left cerebellum (series 5, image 60 and series 6, image 10; series 5, image 56 and series 6, image 6). These areas are associated with minimally increased T2 hyperintense signal. No acute hemorrhage, mass, mass effect, or midline shift. No hydrocephalus or extra-axial collection. Lacunar infarcts in the bilateral basal ganglia and pons. Vascular: Please see MRA findings below. Skull and upper cervical spine: Normal marrow signal. Degenerative changes in the  cervical spine with trace anterolisthesis and a small disc bulge at C3-C4, which indents the ventral spinal cord. Sinuses/Orbits: No acute finding. Status post left lens replacement. Other: No acute intracranial process. MRA HEAD FINDINGS Anterior circulation: Both internal carotid arteries are patent to the termini, with increased stenosis in the right distal ICA, near  the terminus, with multifocal moderate to severe stenosis (series 1, images 89, 91, and 95). A1 segments patent. Normal anterior communicating artery. Anterior cerebral arteries are patent to their distal aspects. Moderate multifocal narrowing of the right M1 segment (series 1, image 100), which has progressed from the prior exam. Moderate multifocal narrowing in the right M2 branches (for example series 1, image 102 and 92), new from the prior exam, with similar additional multifocal irregularity and poor signal in the more distal right MCA branches. Redemonstrated duplicate left MCA with mild irregularity in the more proximal M1 segment but no significant stenosis. Distal left MCA branches perfused. Posterior circulation: Vertebral arteries patent to the vertebrobasilar junction without stenosis. Posterior inferior cerebral arteries patent proximally, with no signal in the left PICA just distal to its takeoff (series 1, images 22-24), without evidence of reconstitution, new from the prior exam. The visualized right PICA is patent. Basilar patent to its distal aspect, with a small fenestration proximally. Superior cerebellar arteries patent bilaterally. Patent right P1 segment. Fetal origin of the left PCA from a patent left posterior communicating artery. Moderate stenosis in the mid right P2 (series 1, images 103-108), unchanged. PCAs otherwise perfused to their distal aspects without stenosis. The left posterior communicating artery is not visualized. Anatomic variants: Fetal origin of the left PCA. MRA NECK FINDINGS Aortic arch: Two-vessel arch  with a common origin of the brachiocephalic and left common carotid arteries. Imaged portion shows no evidence of aneurysm or dissection. No significant stenosis of the major arch vessel origins. Right carotid system: No evidence of dissection, occlusion, or hemodynamically significant stenosis (greater than 50%). Left carotid system: No evidence of dissection, occlusion, or hemodynamically significant stenosis (greater than 50%). Vertebral arteries: No evidence of dissection, occlusion, or hemodynamically significant stenosis (greater than 50%). Other: None IMPRESSION: 1. 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 No evidence of hemorrhage, mass effect, or midline shift. 2. Progressive intracranial atherosclerotic disease compared to the prior exam, with moderate to severe stenosis in the distal right ICA and moderate multifocal narrowing in the right M1 and M2 branches. Other previously noted stenoses are overall unchanged. 3.  No hemodynamically significant stenosis in the neck. These results will be called to the ordering clinician or representative by the Radiologist Assistant, and communication documented in the PACS or [employer]. Electronically Signed   By: Merilyn Baba M.D.   On: 11/16/2021 22:04   MR ANGIO NECK WO CONTRAST  Result Date: 11/16/2021 CLINICAL DATA:  Dizziness, unsteady gait, left arm disc coordination, stroke suspected EXAM: MRI HEAD WITHOUT CONTRAST MRA HEAD WITHOUT CONTRAST MRA NECK WITHOUT CONTRAST TECHNIQUE: Multiplanar, multi-echo pulse sequences of the brain and surrounding structures were acquired without intravenous contrast. Angiographic images of the Circle of Willis were acquired using MRA technique without intravenous contrast. Angiographic images of the neck were acquired using MRA technique without  intravenous contrast. Carotid stenosis measurements (when applicable) are obtained utilizing NASCET criteria, using the distal internal carotid diameter as the denominator. The patient refused contrast. COMPARISON:  06/12/2015 MRI and MRA head clinical correlation is also made with 11/16/2021 CT head FINDINGS: MRI HEAD FINDINGS Brain: Two somewhat wedge-shaped areas of restricted diffusion with ADC correlate in the mid and inferior left cerebellum (series 5, image 60 and series 6, image 10; series 5, image 56 and  series 6, image 6). These areas are associated with minimally increased T2 hyperintense signal. No acute hemorrhage, mass, mass effect, or midline shift. No hydrocephalus or extra-axial collection. Lacunar infarcts in the bilateral basal ganglia and pons. Vascular: Please see MRA findings below. Skull and upper cervical spine: Normal marrow signal. Degenerative changes in the cervical spine with trace anterolisthesis and a small disc bulge at C3-C4, which indents the ventral spinal cord. Sinuses/Orbits: No acute finding. Status post left lens replacement. Other: No acute intracranial process. MRA HEAD FINDINGS Anterior circulation: Both internal carotid arteries are patent to the termini, with increased stenosis in the right distal ICA, near the terminus, with multifocal moderate to severe stenosis (series 1, images 89, 91, and 95). A1 segments patent. Normal anterior communicating artery. Anterior cerebral arteries are patent to their distal aspects. Moderate multifocal narrowing of the right M1 segment (series 1, image 100), which has progressed from the prior exam. Moderate multifocal narrowing in the right M2 branches (for example series 1, image 102 and 92), new from the prior exam, with similar additional multifocal irregularity and poor signal in the more distal right MCA branches. Redemonstrated duplicate left MCA with mild irregularity in the more proximal M1 segment but no significant stenosis.  Distal left MCA branches perfused. Posterior circulation: Vertebral arteries patent to the vertebrobasilar junction without stenosis. Posterior inferior cerebral arteries patent proximally, with no signal in the left PICA just distal to its takeoff (series 1, images 22-24), without evidence of reconstitution, new from the prior exam. The visualized right PICA is patent. Basilar patent to its distal aspect, with a small fenestration proximally. Superior cerebellar arteries patent bilaterally. Patent right P1 segment. Fetal origin of the left PCA from a patent left posterior communicating artery. Moderate stenosis in the mid right P2 (series 1, images 103-108), unchanged. PCAs otherwise perfused to their distal aspects without stenosis. The left posterior communicating artery is not visualized. Anatomic variants: Fetal origin of the left PCA. MRA NECK FINDINGS Aortic arch: Two-vessel arch with a common origin of the brachiocephalic and left common carotid arteries. Imaged portion shows no evidence of aneurysm or dissection. No significant stenosis of the major arch vessel origins. Right carotid system: No evidence of dissection, occlusion, or hemodynamically significant stenosis (greater than 50%). Left carotid system: No evidence of dissection, occlusion, or hemodynamically significant stenosis (greater than 50%). Vertebral arteries: No evidence of dissection, occlusion, or hemodynamically significant stenosis (greater than 50%). Other: None IMPRESSION: 1. 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 No evidence of hemorrhage, mass effect, or midline shift. 2. Progressive intracranial atherosclerotic disease compared to the prior exam, with moderate to severe stenosis in the distal right ICA and moderate multifocal narrowing in the right M1 and M2  branches. Other previously noted stenoses are overall unchanged. 3.  No hemodynamically significant stenosis in the neck. These results will be called to the ordering clinician or representative by the Radiologist Assistant, and communication documented in the PACS or [employer]. Electronically Signed   By: Merilyn Baba M.D.   On: 11/16/2021 22:04   MR BRAIN WO CONTRAST  Result Date: 11/16/2021 CLINICAL DATA:  Dizziness, unsteady gait, left arm disc coordination, stroke suspected EXAM: MRI HEAD WITHOUT CONTRAST MRA HEAD WITHOUT CONTRAST MRA NECK WITHOUT CONTRAST TECHNIQUE: Multiplanar, multi-echo pulse sequences of the brain and surrounding  structures were acquired without intravenous contrast. Angiographic images of the Circle of Willis were acquired using MRA technique without intravenous contrast. Angiographic images of the neck were acquired using MRA technique without intravenous contrast. Carotid stenosis measurements (when applicable) are obtained utilizing NASCET criteria, using the distal internal carotid diameter as the denominator. The patient refused contrast. COMPARISON:  06/12/2015 MRI and MRA head clinical correlation is also made with 11/16/2021 CT head FINDINGS: MRI HEAD FINDINGS Brain: Two somewhat wedge-shaped areas of restricted diffusion with ADC correlate in the mid and inferior left cerebellum (series 5, image 60 and series 6, image 10; series 5, image 56 and series 6, image 6). These areas are associated with minimally increased T2 hyperintense signal. No acute hemorrhage, mass, mass effect, or midline shift. No hydrocephalus or extra-axial collection. Lacunar infarcts in the bilateral basal ganglia and pons. Vascular: Please see MRA findings below. Skull and upper cervical spine: Normal marrow signal. Degenerative changes in the cervical spine with trace anterolisthesis and a small disc bulge at C3-C4, which indents the ventral spinal cord. Sinuses/Orbits: No acute finding. Status  post left lens replacement. Other: No acute intracranial process. MRA HEAD FINDINGS Anterior circulation: Both internal carotid arteries are patent to the termini, with increased stenosis in the right distal ICA, near the terminus, with multifocal moderate to severe stenosis (series 1, images 89, 91, and 95). A1 segments patent. Normal anterior communicating artery. Anterior cerebral arteries are patent to their distal aspects. Moderate multifocal narrowing of the right M1 segment (series 1, image 100), which has progressed from the prior exam. Moderate multifocal narrowing in the right M2 branches (for example series 1, image 102 and 92), new from the prior exam, with similar additional multifocal irregularity and poor signal in the more distal right MCA branches. Redemonstrated duplicate left MCA with mild irregularity in the more proximal M1 segment but no significant stenosis. Distal left MCA branches perfused. Posterior circulation: Vertebral arteries patent to the vertebrobasilar junction without stenosis. Posterior inferior cerebral arteries patent proximally, with no signal in the left PICA just distal to its takeoff (series 1, images 22-24), without evidence of reconstitution, new from the prior exam. The visualized right PICA is patent. Basilar patent to its distal aspect, with a small fenestration proximally. Superior cerebellar arteries patent bilaterally. Patent right P1 segment. Fetal origin of the left PCA from a patent left posterior communicating artery. Moderate stenosis in the mid right P2 (series 1, images 103-108), unchanged. PCAs otherwise perfused to their distal aspects without stenosis. The left posterior communicating artery is not visualized. Anatomic variants: Fetal origin of the left PCA. MRA NECK FINDINGS Aortic arch: Two-vessel arch with a common origin of the brachiocephalic and left common carotid arteries. Imaged portion shows no evidence of aneurysm or dissection. No significant  stenosis of the major arch vessel origins. Right carotid system: No evidence of dissection, occlusion, or hemodynamically significant stenosis (greater than 50%). Left carotid system: No evidence of dissection, occlusion, or hemodynamically significant stenosis (greater than 50%). Vertebral arteries: No evidence of dissection, occlusion, or hemodynamically significant stenosis (greater than 50%). Other: None IMPRESSION: 1. 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 No evidence of hemorrhage, mass effect, or midline shift. 2. Progressive intracranial atherosclerotic disease compared to the prior exam, with moderate to severe stenosis in the distal right ICA and  moderate multifocal narrowing in the right M1 and M2 branches. Other previously noted stenoses are overall unchanged. 3.  No hemodynamically significant stenosis in the neck. These results will be called to the ordering clinician or representative by the Radiologist Assistant, and communication documented in the PACS or [employer]. Electronically Signed   By: Wiliam Ke M.D.   On: 11/16/2021 22:04   ECHOCARDIOGRAM COMPLETE  Result Date: 11/17/2021    ECHOCARDIOGRAM REPORT   Patient Name:   [name] Date of Exam: 11/17/2021 Medical Rec #:  [id number]         Height:       60.0 in Accession #:    [id number]        Weight:       145.0 lb Date of Birth:  [birth year]         BSA:          1.628 m Patient Age:    76 years          BP:           168/77 mmHg Patient Gender: F                 HR:           70 bpm. Exam Location:  Inpatient Procedure: 2D Echo, Cardiac Doppler and Color Doppler Indications:    TIA  History:        Patient has prior history of Echocardiogram examinations, most                 recent 06/12/2015. Arrythmias:Tachycardia and PVC,                  Signs/Symptoms:Syncope; Risk Factors:Dyslipidemia and                 Hypertension.  Sonographer:    Neomia Dear RDCS Referring Phys: [id number] DAWOOD S ELGERGAWY IMPRESSIONS  1. Left ventricular ejection fraction, by estimation, is 65 to 70%. The left ventricle has hyperdynamic function. The left ventricle has no regional wall motion abnormalities. There is moderate left ventricular hypertrophy. Left ventricular diastolic parameters are consistent with Grade I diastolic dysfunction (impaired relaxation).  2. Right ventricular systolic function is normal. The right ventricular size is normal. Tricuspid regurgitation signal is inadequate for assessing PA pressure.  3. The mitral valve is normal in structure. No evidence of mitral valve regurgitation. No evidence of mitral stenosis.  4. The aortic valve is tricuspid. Aortic valve regurgitation is mild. No aortic stenosis is present.  5. The inferior vena cava is normal in size with <50% respiratory variability, suggesting right atrial pressure of 8 mmHg. FINDINGS  Left Ventricle: Left ventricular ejection fraction, by estimation, is 65 to 70%. The left ventricle has hyperdynamic function. The left ventricle has no regional wall motion abnormalities. The left ventricular internal cavity size was normal in size. There is moderate left ventricular hypertrophy. Left ventricular diastolic parameters are consistent with Grade I diastolic dysfunction (impaired relaxation). Right Ventricle: The right ventricular size is normal. No increase in right ventricular wall thickness. Right ventricular systolic function is normal. Tricuspid regurgitation signal is inadequate for assessing PA pressure. Left Atrium: Left atrial size was normal in size. Right Atrium: Right atrial size was normal in size. Pericardium: Trivial pericardial effusion is present. Mitral Valve: The mitral valve is normal in structure. No evidence of mitral valve regurgitation. No evidence of mitral valve stenosis.  Tricuspid Valve: The tricuspid valve is normal in structure. Tricuspid valve  regurgitation is not demonstrated. Aortic Valve: The aortic valve is tricuspid. Aortic valve regurgitation is mild. Aortic regurgitation PHT measures 572 msec. No aortic stenosis is present. Aortic valve mean gradient measures 6.0 mmHg. Aortic valve peak gradient measures 11.6 mmHg. Aortic valve area, by VTI measures 2.03 cm. Pulmonic Valve: The pulmonic valve was normal in structure. Pulmonic valve regurgitation is trivial. Aorta: The aortic root is normal in size and structure. Venous: The inferior vena cava is normal in size with less than 50% respiratory variability, suggesting right atrial pressure of 8 mmHg. IAS/Shunts: No atrial level shunt detected by color flow Doppler.  LEFT VENTRICLE PLAX 2D LVIDd:         4.40 cm     Diastology LVIDs:         2.60 cm     LV e' medial:    3.75 cm/s LV PW:         1.20 cm     LV E/e' medial:  21.3 LV IVS:        1.20 cm     LV e' lateral:   6.62 cm/s LVOT diam:     2.00 cm     LV E/e' lateral: 12.1 LV SV:         74 LV SV Index:   45 LVOT Area:     3.14 cm  LV Volumes (MOD) LV vol d, MOD A2C: 46.5 ml LV vol d, MOD A4C: 51.6 ml LV vol s, MOD A2C: 16.6 ml LV vol s, MOD A4C: 15.6 ml LV SV MOD A2C:     29.9 ml LV SV MOD A4C:     51.6 ml LV SV MOD BP:      33.1 ml RIGHT VENTRICLE RV S prime:     19.60 cm/s TAPSE (M-mode): 2.9 cm LEFT ATRIUM             Index LA diam:        3.40 cm 2.09 cm/m LA Vol (A2C):   39.2 ml 24.08 ml/m LA Vol (A4C):   31.1 ml 19.10 ml/m LA Biplane Vol: 34.8 ml 21.37 ml/m  AORTIC VALVE                     PULMONIC VALVE AV Area (Vmax):    2.14 cm      PV Vmax:          1.04 m/s AV Area (Vmean):   2.05 cm      PV Vmean:         68.000 cm/s AV Area (VTI):     2.03 cm      PV VTI:           0.184 m AV Vmax:           170.00 cm/s   PV Peak grad:     4.4 mmHg AV Vmean:          114.000 cm/s  PV Mean grad:     2.5 mmHg AV VTI:            0.362 m       PR End Diast Vel: 3.10  msec AV Peak Grad:      11.6 mmHg AV Mean Grad:      6.0 mmHg LVOT Vmax:         116.00 cm/s LVOT Vmean:        74.400 cm/s LVOT VTI:          0.234 m LVOT/AV  VTI ratio: 0.65 AI PHT:            572 msec  AORTA Ao Asc diam: 3.00 cm MITRAL VALVE MV Area (PHT): 3.65 cm     SHUNTS MV Decel Time: 208 msec     Systemic VTI:  0.23 m MV E velocity: 79.80 cm/s   Systemic Diam: 2.00 cm MV A velocity: 142.00 cm/s MV E/A ratio:  0.56 Dalton McleanMD Electronically signed by Franki Monte Signature Date/Time: 11/17/2021/3:34:20 PM    Final      PERTINENT LAB RESULTS: CBC: Recent Labs    11/16/21 1513 11/16/21 1541 11/17/21 0132  WBC 6.5  --  7.3  HGB 13.9 13.9 13.0  HCT 41.0 41.0 37.8  PLT 248  --  220   CMET CMP     Component Value Date/Time   NA 138 11/17/2021 0132   NA 140 06/18/2021 1153   K 3.7 11/17/2021 0132   CL 107 11/17/2021 0132   CO2 24 11/17/2021 0132   GLUCOSE 123 (H) 11/17/2021 0132   BUN 12 11/17/2021 0132   BUN 19 06/18/2021 1153   CREATININE 0.97 11/17/2021 0132   CREATININE 0.79 09/29/2014 0959   CALCIUM 9.3 11/17/2021 0132   PROT 6.3 (L) 11/17/2021 0132   ALBUMIN 3.6 11/17/2021 0132   AST 19 11/17/2021 0132   ALT 18 11/17/2021 0132   ALKPHOS 66 11/17/2021 0132   BILITOT 0.7 11/17/2021 0132   GFRNONAA >60 11/17/2021 0132   GFRAA >60 06/12/2015 0525    GFR Estimated Creatinine Clearance: 41.1 mL/min (by C-G formula based on SCr of 0.97 mg/dL). No results for input(s): LIPASE, AMYLASE in the last 72 hours. No results for input(s): CKTOTAL, CKMB, CKMBINDEX, TROPONINI in the last 72 hours. Invalid input(s): POCBNP No results for input(s): DDIMER in the last 72 hours. Recent Labs    11/17/21 0132  HGBA1C 6.3*   Recent Labs    11/17/21 0132  CHOL 231*  HDL 52  LDLCALC 154*  TRIG 127  CHOLHDL 4.4   No results for input(s): TSH, T4TOTAL, T3FREE, THYROIDAB in the last 72 hours.  Invalid input(s): FREET3 No results for input(s): VITAMINB12, FOLATE,  FERRITIN, TIBC, IRON, RETICCTPCT in the last 72 hours. Coags: Recent Labs    11/16/21 1513  INR 1.0   Microbiology: Recent Results (from the past 240 hour(s))  Resp Panel by RT-PCR (Flu A&B, Covid) Nasopharyngeal Swab     Status: None   Collection Time: 11/16/21  3:00 PM   Specimen: Nasopharyngeal Swab; Nasopharyngeal(NP) swabs in vial transport medium  Result Value Ref Range Status   SARS Coronavirus 2 by RT PCR NEGATIVE NEGATIVE Final    Comment: (NOTE) SARS-CoV-2 target nucleic acids are NOT DETECTED.  The SARS-CoV-2 RNA is generally detectable in upper respiratory specimens during the acute phase of infection. The lowest concentration of SARS-CoV-2 viral copies this assay can detect is 138 copies/mL. A negative result does not preclude SARS-Cov-2 infection and should not be used as the sole basis for treatment or other patient management decisions. A negative result may occur with  improper specimen collection/handling, submission of specimen other than nasopharyngeal swab, presence of viral mutation(s) within the areas targeted by this assay, and inadequate number of viral copies(<138 copies/mL). A negative result must be combined with clinical observations, patient history, and epidemiological information. The expected result is Negative.  Fact Sheet for Patients:  EntrepreneurPulse.com.au  Fact Sheet for Healthcare Providers:  IncredibleEmployment.be  This test is no t yet approved or  cleared by the [place] and  has been authorized for detection and/or diagnosis of SARS-CoV-2 by FDA under an Emergency Use Authorization (EUA). This EUA will remain  in effect (meaning this test can be used) for the duration of the COVID-19 declaration under Section 564(b)(1) of the Act, 21 U.S.C.section 360bbb-3(b)(1), unless the authorization is terminated  or revoked sooner.       Influenza A by PCR NEGATIVE NEGATIVE Final   Influenza  B by PCR NEGATIVE NEGATIVE Final    Comment: (NOTE) The Xpert Xpress SARS-CoV-2/FLU/RSV plus assay is intended as an aid in the diagnosis of influenza from Nasopharyngeal swab specimens and should not be used as a sole basis for treatment. Nasal washings and aspirates are unacceptable for Xpert Xpress SARS-CoV-2/FLU/RSV testing.  Fact Sheet for Patients: EntrepreneurPulse.com.au  Fact Sheet for Healthcare Providers: IncredibleEmployment.be  This test is not yet approved or cleared by the [place] FDA and has been authorized for detection and/or diagnosis of SARS-CoV-2 by FDA under an Emergency Use Authorization (EUA). This EUA will remain in effect (meaning this test can be used) for the duration of the COVID-19 declaration under Section 564(b)(1) of the Act, 21 U.S.C. section 360bbb-3(b)(1), unless the authorization is terminated or revoked.  Performed at [facility], [place] [address], [place], [place] [address]     FURTHER DISCHARGE INSTRUCTIONS:  Get Medicines reviewed and adjusted: Please take all your medications with you for your next visit with your Primary MD  Laboratory/radiological data: Please request your Primary MD to go over all hospital tests and procedure/radiological results at the follow up, please ask your Primary MD to get all Hospital records sent to his/her office.  In some cases, they will be blood work, cultures and biopsy results pending at the time of your discharge. Please request that your primary care M.D. goes through all the records of your hospital data and follows up on these results.  Also Note the following: If you experience worsening of your admission symptoms, develop shortness of breath, life threatening emergency, suicidal or homicidal thoughts you must seek medical attention immediately by calling 911 or calling your MD immediately  if symptoms less severe.  You must read complete  instructions/literature along with all the possible adverse reactions/side effects for all the Medicines you take and that have been prescribed to you. Take any new Medicines after you have completely understood and accpet all the possible adverse reactions/side effects.   Do not drive when taking Pain medications or sleeping medications (Benzodaizepines)  Do not take more than prescribed Pain, Sleep and Anxiety Medications. It is not advisable to combine anxiety,sleep and pain medications without talking with your primary care practitioner  Special Instructions: If you have smoked or chewed Tobacco  in the last 2 yrs please stop smoking, stop any regular Alcohol  and or any Recreational drug use.  Wear Seat belts while driving.  Please note: You were cared for by a hospitalist during your hospital stay. Once you are discharged, your primary care physician will handle any further medical issues. Please note that NO REFILLS for any discharge medications will be authorized once you are discharged, as it is imperative that you return to your primary care physician (or establish a relationship with a primary care physician if you do not have one) for your post hospital discharge needs so that they can reassess your need for medications and monitor your lab values.  Total Time spent coordinating discharge including counseling, education and  face to face time equals greater than 30 minutes.  SignedOren Binet 11/18/2021 10:09 AM

## 2021-11-23 ENCOUNTER — Ambulatory Visit: Payer: Medicare Other

## 2021-11-23 ENCOUNTER — Ambulatory Visit: Payer: Medicare Other | Attending: Otolaryngology | Admitting: Occupational Therapy

## 2021-11-23 ENCOUNTER — Encounter: Payer: Self-pay | Admitting: Occupational Therapy

## 2021-11-23 VITALS — BP 155/76

## 2021-11-23 DIAGNOSIS — R41841 Cognitive communication deficit: Secondary | ICD-10-CM

## 2021-11-23 DIAGNOSIS — R42 Dizziness and giddiness: Secondary | ICD-10-CM

## 2021-11-23 DIAGNOSIS — R269 Unspecified abnormalities of gait and mobility: Secondary | ICD-10-CM

## 2021-11-23 DIAGNOSIS — I69354 Hemiplegia and hemiparesis following cerebral infarction affecting left non-dominant side: Secondary | ICD-10-CM | POA: Insufficient documentation

## 2021-11-23 DIAGNOSIS — R270 Ataxia, unspecified: Secondary | ICD-10-CM | POA: Diagnosis present

## 2021-11-23 DIAGNOSIS — R278 Other lack of coordination: Secondary | ICD-10-CM | POA: Diagnosis present

## 2021-11-23 DIAGNOSIS — M6281 Muscle weakness (generalized): Secondary | ICD-10-CM | POA: Diagnosis present

## 2021-11-23 NOTE — Therapy (Signed)
OUTPATIENT SPEECH LANGUAGE PATHOLOGY EVALUATION   Patient Name: [name] MRN: [id number] DOB:Jun 04, 1946, 76 y.o., female Today's Date: 11/23/2021  PCP: Seward Carol, MD REFERRING PROVIDER: Jonetta Osgood, MD   End of Session - 11/23/21 1637     Visit Number 1    Number of Visits 1    SLP Start Time 1533    SLP Stop Time  [id number]    SLP Time Calculation (min) 42 min    Activity Tolerance Patient tolerated treatment well             Past Medical History:  Diagnosis Date   Anxiety    Arthritis    Balance problem    Depression    Double vision 10/2014   bilateral   Dyslipidemia    High blood pressure    Multiple thyroid nodules    OSA (obstructive sleep apnea)    Paroxysmal SVT (supraventricular tachycardia) (HCC)    PVC (premature ventricular contraction)    intermittent   Syncope and collapse    last friday had alot of dizziness   Past Surgical History:  Procedure Laterality Date   ABDOMINAL HYSTERECTOMY     catheter ablation     TONSILLECTOMY     Patient Active Problem List   Diagnosis Date Noted   Acute CVA (cerebrovascular accident) ([facility]) 11/17/2021   Hypertensive urgency 11/16/2021   Focal neurological deficit 11/16/2021   Paroxysmal atrial tachycardia ([place]) 06/18/2021   Chest pain of uncertain etiology [id number]   Mixed hyperlipidemia 06/18/2021   TIA (transient ischemic attack) 06/12/2015   Amaurosis fugax    Atypical migraine    Amaurosis fugax of left eye 06/11/2015   Dizziness and giddiness 04/04/2013   Abnormality of gait 02/15/2013   Spinal stenosis of cervical region 02/15/2013   Palpitations 11/28/2012   History of supraventricular tachycardia 11/28/2012   Essential hypertension 11/28/2012   History of CVA (cerebrovascular accident) 11/28/2012    ONSET DATE: 11-16-21 (referral= 11/17/2021)  REFERRING DIAG:  R29.818 (ICD-10-CM) - Focal neurological deficit Z86.73 (ICD-10-CM) - History of CVA (cerebrovascular accident)    THERAPY DIAG: Cognitive communication deficit  Rationale for Evaluation and Treatment Rehabilitation  SUBJECTIVE:   SUBJECTIVE STATEMENT: "I feel loopy and tired"  Pt accompanied by: significant other  PERTINENT HISTORY: Patient is a 76 y.o. female admitted with LUE ataxia and weakness.  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 PMH significant for hypertension, hyperlipidemia, prediabetes, medication nonadherence, SVT, anxiety/depression, Mnire's disease.    PAIN:  Are you having pain? No  FALLS: Has patient fallen in last 6 months?  No  LIVING ENVIRONMENT: Lives with: lives with their spouse Lives in: House/apartment  PLOF:  Level of assistance: Independent with ADLs, Independent with IADLs Employment: Retired  OBJECTIVE:   COGNITION: Overall cognitive status: Within functional limits for tasks assessed  COGNITIVE COMMUNICATION Following directions: Follows multi-step commands consistently  Auditory comprehension: WFL Verbal expression: WFL Functional communication: WFL  ORAL MOTOR EXAMINATION WFL  STANDARDIZED ASSESSMENTS: SLUMS: 30/30   PATIENT EDUCATION: Education details: eval results Person educated: Patient and Spouse Education method: Explanation and Demonstration Education comprehension: verbalized understanding and returned demonstration   ASSESSMENT:  CLINICAL IMPRESSION: Patient is a 76 y.o. female who was seen today for ST evaluation following acute CVA on 11-16-21. Pt and husband report baseline functioning for cognition, communication, and swallow function. SLUMS completed with score of 30/30, indicating WFL cognitive functioning. Pt able  to immediately identify any errors and self-correct without assistance. Pt is at baseline for all assessed areas, with no concerns reported by husband or patient. Pt does not require additional ST services at this time.      REHAB POTENTIAL: Excellent  PLAN: SLP FREQUENCY: one time visit  SLP DURATION: other: eval only    Marzetta Board, [place] 11/23/2021, 4:37 PM

## 2021-11-23 NOTE — Therapy (Unsigned)
OUTPATIENT OCCUPATIONAL THERAPY NEURO EVALUATION  Patient Name: [name] MRN: [id number] DOB:December 21, 1945, 76 y.o., female Today's Date: 11/24/2021  PCP: Seward Carol, MD REFERRING PROVIDER: Jonetta Osgood, MD    OT End of Session - 11/24/21 1320     Visit Number 1    Number of Visits 17    Date for OT Re-Evaluation 02/02/22    Authorization Type Medicare A&B/Tricare    Progress Note Due on Visit 10    OT Start Time 1615    OT Stop Time 1700    OT Time Calculation (min) 45 min    Activity Tolerance Patient tolerated treatment well    Behavior During Therapy [facility] for tasks assessed/performed             Past Medical History:  Diagnosis Date   Anxiety    Arthritis    Balance problem    Depression    Double vision 10/2014   bilateral   Dyslipidemia    High blood pressure    Multiple thyroid nodules    OSA (obstructive sleep apnea)    Paroxysmal SVT (supraventricular tachycardia) (HCC)    PVC (premature ventricular contraction)    intermittent   Syncope and collapse    last friday had alot of dizziness   Past Surgical History:  Procedure Laterality Date   ABDOMINAL HYSTERECTOMY     catheter ablation     TONSILLECTOMY     Patient Active Problem List   Diagnosis Date Noted   Acute CVA (cerebrovascular accident) ([place]) 11/17/2021   Hypertensive urgency 11/16/2021   Focal neurological deficit 11/16/2021   Paroxysmal atrial tachycardia ([place]) 06/18/2021   Chest pain of uncertain etiology [id number]   Mixed hyperlipidemia 06/18/2021   TIA (transient ischemic attack) 06/12/2015   Amaurosis fugax    Atypical migraine    Amaurosis fugax of left eye 06/11/2015   Dizziness and giddiness 04/04/2013   Abnormality of gait 02/15/2013   Spinal stenosis of cervical region 02/15/2013   Palpitations 11/28/2012   History of supraventricular tachycardia 11/28/2012   Essential hypertension 11/28/2012   History of CVA (cerebrovascular accident) 11/28/2012      Rationale for Evaluation and Treatment Rehabilitation    ONSET DATE: 11/16/21  REFERRING DIAG:  I16.0 (ICD-10-CM) - Hypertensive urgency  R29.818 (ICD-10-CM) - Focal neurological deficit  Z86.73 (ICD-10-CM) - History of CVA (cerebrovascular accident)    THERAPY DIAG:  Muscle weakness (generalized)  Other lack of coordination  Hemiplegia and hemiparesis following cerebral infarction affecting left non-dominant side (HCC)  Ataxia  SUBJECTIVE:   SUBJECTIVE STATEMENT: "It has gotten better - I want to show you what I have been doing at home"   Pt accompanied by: significant other, husband Al  PERTINENT HISTORY: hypertension, not on any medications currently, hyperlipidemia, vertigo, patient with previous history of TIA in 2016, with her MRA head significant for few areas of focal stenosis, no acute findings however CT significant for chronic left lacunar cerebral infarct, blood pressure was elevated 215/115  PRECAUTIONS: Fall  WEIGHT BEARING RESTRICTIONS No  PAIN:  Are you having pain? No  FALLS: Has patient fallen in last 6 months? No  LIVING ENVIRONMENT: Lives with: lives with their family and lives with their spouse, 1 dog  Lives in: House/apartment Stairs:  3 steps outside to inside, basement, bed/bath on first floor - walk in shower with threshold.  Has following equipment at home: Gilford Rile - 2 wheeled and shower chair  PLOF: Vocation/Vocational requirements: retired, Leisure: host, go to [employer],  go out to eat, and Independent PLOF   PATIENT GOALS "do things more smoothly"  OBJECTIVE:    DIAGNOSTIC FINDINGS:  11/24/21 IMPRESSION: 1. 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 No evidence of hemorrhage, mass effect, or midline shift. 2. Progressive intracranial atherosclerotic disease  compared to the prior exam, with moderate to severe stenosis in the distal right ICA and moderate multifocal narrowing in the right M1 and M2 branches. Other previously noted stenoses are overall unchanged. 3.  No hemodynamically significant stenosis in the neck.    HAND DOMINANCE: Right  ADLs: Overall ADLs: Mod I - Independent Eating: Increased time Grooming: Increased time UB Dressing: Increased time LB Dressing: Increased time Toileting: mod I Bathing: Supervision Tub Shower transfers: Supervision Equipment: Shower seat with back and Walk in shower   IADLs: Shopping: Not completing currently Light housekeeping: Assistance for carrying heavier items Meal Prep: Spouse does most of the cooking Community mobility: Relies on family and friends Medication management: Independent Doctor, hospital: Spouse handles Handwriting: 100% legible  MOBILITY STATUS: Needs Assist: Supervision with 2 wheel walker  - was using prior to CVA  POSTURE COMMENTS:  rounded shoulders and forward head  FUNCTIONAL OUTCOME MEASURES: FOTO: not completed at evaluation at front desk/check in  UE ROM     Active ROM Right 10/25/2021 Left 10/25/2021  Shoulder flexion  [facility]  Shoulder abduction   Shoulder adduction   Shoulder extension   Shoulder internal rotation   Shoulder external rotation   Elbow flexion   Elbow extension   Wrist flexion   Wrist extension   Wrist ulnar deviation   Wrist radial deviation   Wrist pronation   Wrist supination   (Blank rows = not tested)   UE MMT:     MMT Right 10/25/2021 Left 10/25/2021  Shoulder flexion [facility] 4/5  Shoulder abduction  4/5  Shoulder adduction    Shoulder extension    Shoulder internal rotation    Shoulder external rotation    Middle trapezius    Lower trapezius    Elbow flexion  4/5  Elbow extension  4/5  Wrist flexion    Wrist extension    Wrist ulnar deviation    Wrist radial deviation    Wrist pronation    Wrist supination     (Blank rows = not tested)  HAND FUNCTION: Grip strength: Right: 45.4 lbs; Left: 41.6 lbs  COORDINATION: Finger Nose Finger test: [facility] 9 Hole Peg test: Right: 25.25 sec; Left: 31.33 sec  SENSATION: WFL  EDEMA: N/A  COGNITION: Overall cognitive status: Within functional limits for tasks assessed  VISION: Subjective report: Pt reports changes prior to stroke with acuity - encouraged to f/u with regular eye doc Baseline vision:  cataracts, wears glasses Visual history: cataracts  PERCEPTION: WFL  PRAXIS: WFL  TODAY'S TREATMENT:  11/23/21 None today - evaluation only.   PATIENT EDUCATION: Education details: Education on role and purpose of OT  Person educated: Patient and Spouse Education method: Explanation Education comprehension: verbalized understanding   HOME EXERCISE PROGRAM: 11/23/21 To Be Established    GOALS: Goals reviewed with patient? No  SHORT TERM GOALS: Target date: 12/22/2021  Pt will be independent with HEP targeting LUE coordination and proximal strength  Baseline: Goal status: INITIAL  2.  Pt will verbalize understanding of adapted strategies and/or equipment  PRN to increase safety and independence with ADLs and IADLs (I.e. home management tasks, bathing etc)  Baseline:  Goal status: INITIAL   LONG TERM GOALS: Target date: 02/02/2022  Pt will be independent with updated HEPs. Baseline:  Goal status: INITIAL  2.  Pt will complete all basic ADLs with modified independence and good safety awareness. Baseline:  Goal status: INITIAL  3.  Pt will increase fine motor coordination in LUE  by improving 9 hole peg test by at least 5 seconds   Baseline: Right: 25.25 sec; Left: 31.33 sec Goal status: INITIAL  4.  Pt will increase strength in LUE shoulder flexion evidenced by ability to reach and grab an item weight at least 3 lbs from mid/high level shelf. Baseline:  Goal status: INITIAL  5.  Pt will increase ability to complete two tasks  simultaneously by completing dual tasks and/or alternating attention tasks with 90% accuracy or greater. Baseline: diff with trail making b Goal status: INITIAL  ASSESSMENT:  CLINICAL IMPRESSION: Patient is a 76 y.o. female who was seen today for occupational therapy evaluation for CVA. Pt presents with deficits with LUE decreased coordination, impaired alternating attention and proximal LUE weakness, unsteadiness on feet and decreased independence with I/ADLs. Skilled occupational therapy is recommended to target listed areas of deficit and increase independence with ADLs and IADLs and decrease caregiver burden.     PERFORMANCE DEFICITS in functional skills including ADLs, IADLs, coordination, dexterity, ROM, FMC, GMC, balance, decreased knowledge of use of DME, and UE functional use, cognitive skills including attention, safety awareness, and understand  IMPAIRMENTS are limiting patient from ADLs, IADLs, and leisure.   COMORBIDITIES may have co-morbidities  that affects occupational performance. Patient will benefit from skilled OT to address above impairments and improve overall function.  MODIFICATION OR ASSISTANCE TO COMPLETE EVALUATION: Min-Moderate modification of tasks or assist with assess necessary to complete an evaluation.  OT OCCUPATIONAL PROFILE AND HISTORY: Detailed assessment: Review of records and additional review of physical, cognitive, psychosocial history related to current functional performance.  CLINICAL DECISION MAKING: Moderate - several treatment options, min-mod task modification necessary  REHAB POTENTIAL: Good  EVALUATION COMPLEXITY: Moderate    PLAN: OT FREQUENCY: 2x/week  OT DURATION: 10 weeks 16 visits over 10 weeks d/t any scheduling conflicts  PLANNED INTERVENTIONS: self care/ADL training, therapeutic exercise, therapeutic activity, neuromuscular re-education, manual therapy, ultrasound, fluidotherapy, patient/family education, cognitive  remediation/compensation, visual/perceptual remediation/compensation, energy conservation, and DME and/or AE instructions  RECOMMENDED OTHER SERVICES: had ST and PT evaluation this day  CONSULTED AND AGREED WITH PLAN OF CARE: Patient and family member/caregiver  PLAN FOR NEXT SESSION: LUE coordination HEP, LUE proximal strengthening (light)   Zachery Conch, OT 11/24/2021, 1:49 PM

## 2021-11-23 NOTE — Therapy (Signed)
OUTPATIENT PHYSICAL THERAPY NEURO EVALUATION   Patient Name: [name] MRN: [id number] DOB:[birth year], 76 y.o., female Today's Date: 11/23/2021   PCP: Seward Carol, MD REFERRING PROVIDER: Jonetta Osgood, MD   PT End of Session - 11/23/21 1615     Visit Number 1    Number of Visits 17    Date for PT Re-Evaluation 01/18/22    Authorization Type Medicare part A& B    PT Start Time 1440    PT Stop Time 1532    PT Time Calculation (min) 52 min    Equipment Utilized During Treatment Gait belt    Activity Tolerance Patient tolerated treatment well    Behavior During Therapy WFL for tasks assessed/performed;Anxious             Past Medical History:  Diagnosis Date   Anxiety    Arthritis    Balance problem    Depression    Double vision 10/2014   bilateral   Dyslipidemia    High blood pressure    Multiple thyroid nodules    OSA (obstructive sleep apnea)    Paroxysmal SVT (supraventricular tachycardia) (HCC)    PVC (premature ventricular contraction)    intermittent   Syncope and collapse    last friday had alot of dizziness   Past Surgical History:  Procedure Laterality Date   ABDOMINAL HYSTERECTOMY     catheter ablation     TONSILLECTOMY     Patient Active Problem List   Diagnosis Date Noted   Acute CVA (cerebrovascular accident) ([place]) 11/17/2021   Hypertensive urgency 11/16/2021   Focal neurological deficit 11/16/2021   Paroxysmal atrial tachycardia ([place]) 06/18/2021   Chest pain of uncertain etiology [id number]   Mixed hyperlipidemia 06/18/2021   TIA (transient ischemic attack) 06/12/2015   Amaurosis fugax    Atypical migraine    Amaurosis fugax of left eye 06/11/2015   Dizziness and giddiness 04/04/2013   Abnormality of gait 02/15/2013   Spinal stenosis of cervical region 02/15/2013   Palpitations 11/28/2012   History of supraventricular tachycardia 11/28/2012   Essential hypertension 11/28/2012   History of CVA (cerebrovascular accident)  11/28/2012    ONSET DATE: 11/16/21  REFERRING DIAG:  I16.0 (ICD-10-CM) - Hypertensive urgency  I63.9 (ICD-10-CM) - Acute CVA (cerebrovascular accident) ([place])    THERAPY DIAG:  Abnormality of gait  Muscle weakness (generalized)  Dizziness and giddiness  Rationale for Evaluation and Treatment Rehabilitation  SUBJECTIVE:  SUBJECTIVE STATEMENT: Patient reports doing well since CVA, reports some mild discomfort in L knee since meniscus tear. Patient noting most significant impact of stroke was to her L UE coordination, but reports still feeling "weak and off balance and veering to the L." She reports no Meniere "attacks" in 5-6 years, though she does report that when she would have "attacks" it would feel as though she had a band of weakness around her R posterior neck- which is a symptom she reported with her recent TIA.   Pt accompanied by: significant other  PERTINENT HISTORY: 76 year old female with PMH significant for HTN, hyperlipidemia, previous TIA in 2016, Mnire's disease, SVT, prediabetes, medication non-adherence.   PAIN:  Are you having pain? Yes: NPRS scale: 6/10 Pain location: hands, feet, L knee  Pain description: aching  Aggravating factors: movement, functional use Relieving factors: rest, does not take rx  PRECAUTIONS: Fall  WEIGHT BEARING RESTRICTIONS No  FALLS: Has patient fallen in last 6 months? No  LIVING ENVIRONMENT: Lives with: lives with their family Lives in: House/apartment Stairs: Yes: Internal: 3 steps; on right going up and External: 3 flights steps; on right going up and on left going up main living on 1st floor Has following equipment at home: Gilford Rile - 2 wheeled  PLOF: Independent, Independent with basic ADLs, Independent with household mobility with  device, Independent with community mobility with device, and Independent with homemaking with ambulation Using RW for past 1-2 months due to torn meniscus   PATIENT GOALS "I want to get rid of this swimming feeling I have"  OBJECTIVE:   DIAGNOSTIC FINDINGS:  11/16/21 CT head without Contrast: Multiple old appearing lacunar infarcts demonstrated throughout the deep and periventricular white matter, pons, and cerebellum. There is progression since previous study. No acute intracranial hemorrhage or mass effect. 11/16/21 MRA head without contrast: 1. 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 No evidence of hemorrhage, mass effect, or midline shift. 2. Progressive intracranial atherosclerotic disease compared to the prior exam, with moderate to severe stenosis in the distal right ICA and moderate multifocal narrowing in the right M1 and M2 branches. Other previously noted stenoses are overall unchanged.  COGNITION: Overall cognitive status: Within functional limits for tasks assessed   SENSATION: WFL  COORDINATION: WFL heel-shin Slightly hypometric L LE figure 8    POSTURE: rounded shoulders, forward head, and increased lumbar lordosis  LOWER EXTREMITY MMT:    MMT Right Eval Left Eval  Hip flexion 4 4-  Hip extension    Hip abduction 4 4-  Hip adduction 4+ 4  Hip internal rotation    Hip external rotation    Knee flexion 4- 3+  Knee extension 4- 3+  Ankle dorsiflexion 4 4-  Ankle plantarflexion 4 4-  Ankle inversion    Ankle eversion    (Blank rows = not tested)  BED MOBILITY:  Patient denies difficulty with bed mobility or exacerbation of dizziness.  TRANSFERS: Assistive device utilized: Environmental consultant - 2 wheeled  Sit to stand: Modified independence Stand to sit: Modified independence Chair to chair:  Modified independence  VESTIBULAR ASSESSMENT   OCULOMOTOR EXAM:   Ocular Alignment: normal   Ocular ROM: No Limitations   Spontaneous Nystagmus: absent   Gaze-Induced Nystagmus: absent   Smooth Pursuits: saccades   Saccades: hypometric/undershoots  VESTIBULAR - OCULAR REFLEX:    Slow VOR: Normal   VOR Cancellation: Normal  STAIRS:  Level of Assistance: SBA  Stair Negotiation Technique: Alternating Pattern  with Single Rail on Right  Number of Stairs: 12     Height of Stairs: 6  Comments: reports of increased L knee pain with flexion/ descending   GAIT: Gait pattern: step through pattern, Right foot flat, Left foot flat, decreased trunk rotation, trunk flexed, and wide BOS Distance walked: clinic distance  Assistive device utilized: Walker - 2 wheeled Level of assistance: SBA Comments: Patient reporting increase in instability with horizontal head turns and NBOS.    [facility] PT Assessment - 11/23/21 0001       Standardized Balance Assessment   10 Meter Walk .90ft/s   limited community     Functional Gait  Assessment   Gait assessed  Yes    Gait Level Surface Walks 20 ft, slow speed, abnormal gait pattern, evidence for imbalance or deviates 10-15 in outside of the 12 in walkway width. Requires more than 7 sec to ambulate 20 ft.    Change in Gait Speed Able to change speed, demonstrates mild gait deviations, deviates 6-10 in outside of the 12 in walkway width, or no gait deviations, unable to achieve a major change in velocity, or uses a change in velocity, or uses an assistive device.    Gait with Horizontal Head Turns Performs head turns smoothly with slight change in gait velocity (eg, minor disruption to smooth gait path), deviates 6-10 in outside 12 in walkway width, or uses an assistive device.    Gait with Vertical Head Turns Performs task with slight change in gait velocity (eg, minor disruption to smooth gait path), deviates 6 - 10 in outside 12 in walkway width or  uses assistive device    Gait and Pivot Turn Pivot turns safely in greater than 3 sec and stops with no loss of balance, or pivot turns safely within 3 sec and stops with mild imbalance, requires small steps to catch balance.    Step Over Obstacle Is able to step over one shoe box (4.5 in total height) but must slow down and adjust steps to clear box safely. May require verbal cueing.    Gait with Narrow Base of Support Ambulates less than 4 steps heel to toe or cannot perform without assistance.    Gait with Eyes Closed Walks 20 ft, slow speed, abnormal gait pattern, evidence for imbalance, deviates 10-15 in outside 12 in walkway width. Requires more than 9 sec to ambulate 20 ft.    Ambulating Backwards Walks 20 ft, slow speed, abnormal gait pattern, evidence for imbalance, deviates 10-15 in outside 12 in walkway width.    Steps Alternating feet, must use rail.    Total Score 14             PATIENT EDUCATION: Education details: PT POC, goals, rehab prognosis, causes of instability, OM results Person educated: Patient and Spouse Education method: Explanation, Demonstration, and Verbal cues Education comprehension: verbalized understanding, returned demonstration, and needs further education   HOME EXERCISE PROGRAM: To be given at next session    GOALS: Goals reviewed with patient? Yes  SHORT TERM GOALS: Target date: 12/07/2021  Pt will be independent with balance/strength HEP to minimize risk for falling at home.   Baseline: To be established Goal status: INITIAL  2.  Pt will improve gait speed to >/= .35  m/sec to demonstrate improved community ambulation  Baseline: .22m/s  Goal status: INITIAL  3.  Patient will improve FGA score to >/= 19/30 for improved dynamic and ambulatory balance Baseline: 14/30 Goal status: INITIAL    LONG TERM GOALS: Target date: 01/18/2022  Pt will improve gait speed to >/= 1 m/sec to demonstrate improved community ambulation  Baseline:  .86m/s Goal status: INITIAL  2.  Patient will improve FGA score to >/= 28/30 Baseline: 14/30 Goal status: INITIAL  3.  Patient will be able to negotiate a full flight of stairs with no HR use to allow for improved access to her home environment. Baseline: 12 steps with R HR use and SBA Goal status: INITIAL  ASSESSMENT:  CLINICAL IMPRESSION: Patient is a 76 y.o. female who was seen today for physical therapy evaluation and treatment for gait instability, LE weakness and dizziness. She appears anxious regarding her recent CVA and experience of dizziness/instability since. She is able to ambulate household distances with use of RW, but requires SBA and RW when ambulating in community settings and community distances. She reports an increase in dizziness with horizontal head turns, but denies any recent falls. She ambulated at a speed of .34m/s, which is indicative of a limited community ambulator while using her RW. She scored a 14/30 on the FGA which indicates that she is at an increased risk for falling, benefits from the use of an AD and would benefit from skilled intervention to minimize her risk for falling. Given objective data noted above, patient will benefit from skilled PT services to minimize her risk for falling and allow for safe return to activity.   OBJECTIVE IMPAIRMENTS Abnormal gait, decreased activity tolerance, decreased balance, decreased coordination, and dizziness.   ACTIVITY LIMITATIONS carrying, lifting, bending, sitting, standing, squatting, sleeping, stairs, transfers, bed mobility, and locomotion level  PARTICIPATION LIMITATIONS: meal prep, interpersonal relationship, driving, shopping, community activity, and yard work  PERSONAL FACTORS Age and 3+ comorbidities: HTN, Mnire's disease, hyperlipidemia,   are also affecting patient's functional outcome.   REHAB POTENTIAL: Good  CLINICAL DECISION MAKING: Evolving/moderate complexity  EVALUATION COMPLEXITY:  Moderate  PLAN: PT FREQUENCY: 2x/week  PT DURATION: 8 weeks  PLANNED INTERVENTIONS: Therapeutic exercises, Therapeutic activity, Neuromuscular re-education, Balance training, Gait training, Patient/Family education, Joint mobilization, Stair training, Vestibular training, Canalith repositioning, Visual/preceptual remediation/compensation, DME instructions, Aquatic Therapy, Cryotherapy, Moist heat, Manual therapy, and Re-evaluation  PLAN FOR NEXT SESSION: provide patient with HEP, begin dynamic/ambulatory balance re-training, progress gait without AD as able.    Debbora Dus, PT Debbora Dus, PT, DPT, CBIS 11/23/2021, 4:20 PM

## 2021-12-01 ENCOUNTER — Ambulatory Visit: Payer: Medicare Other | Attending: Otolaryngology

## 2021-12-01 ENCOUNTER — Ambulatory Visit: Payer: Medicare Other | Admitting: Occupational Therapy

## 2021-12-01 ENCOUNTER — Encounter: Payer: Medicare Other | Admitting: Occupational Therapy

## 2021-12-01 ENCOUNTER — Ambulatory Visit: Payer: Medicare Other | Admitting: Physical Therapy

## 2021-12-01 ENCOUNTER — Encounter: Payer: Self-pay | Admitting: Occupational Therapy

## 2021-12-01 DIAGNOSIS — R278 Other lack of coordination: Secondary | ICD-10-CM

## 2021-12-01 DIAGNOSIS — R269 Unspecified abnormalities of gait and mobility: Secondary | ICD-10-CM

## 2021-12-01 DIAGNOSIS — I69354 Hemiplegia and hemiparesis following cerebral infarction affecting left non-dominant side: Secondary | ICD-10-CM | POA: Diagnosis present

## 2021-12-01 DIAGNOSIS — R42 Dizziness and giddiness: Secondary | ICD-10-CM | POA: Insufficient documentation

## 2021-12-01 DIAGNOSIS — M6281 Muscle weakness (generalized): Secondary | ICD-10-CM | POA: Diagnosis present

## 2021-12-01 NOTE — Therapy (Signed)
OUTPATIENT OCCUPATIONAL THERAPY TREATMENT NOTE   Patient Name: [name] MRN: [id number] DOB:August 28, 1945, 76 y.o., female Today's Date: 12/01/2021  PCP: Seward Carol, MD REFERRING PROVIDER: Jonetta Osgood, MD   END OF SESSION:   OT End of Session - 12/01/21 1352     Visit Number 2    Number of Visits 17    Date for OT Re-Evaluation 02/02/22    Authorization Type Medicare A&B/[employer]    Progress Note Due on Visit 10    OT Start Time 1400    OT Stop Time 1445    OT Time Calculation (min) 45 min    Activity Tolerance Patient tolerated treatment well    Behavior During Therapy [facility] for tasks assessed/performed             Past Medical History:  Diagnosis Date   Anxiety    Arthritis    Balance problem    Depression    Double vision 10/2014   bilateral   Dyslipidemia    High blood pressure    Multiple thyroid nodules    OSA (obstructive sleep apnea)    Paroxysmal SVT (supraventricular tachycardia) (HCC)    PVC (premature ventricular contraction)    intermittent   Syncope and collapse    last friday had alot of dizziness   Past Surgical History:  Procedure Laterality Date   ABDOMINAL HYSTERECTOMY     catheter ablation     TONSILLECTOMY     Patient Active Problem List   Diagnosis Date Noted   Acute CVA (cerebrovascular accident) ([place]) 11/17/2021   Hypertensive urgency 11/16/2021   Focal neurological deficit 11/16/2021   Paroxysmal atrial tachycardia ([place]) 06/18/2021   Chest pain of uncertain etiology [id number]   Mixed hyperlipidemia 06/18/2021   TIA (transient ischemic attack) 06/12/2015   Amaurosis fugax    Atypical migraine    Amaurosis fugax of left eye 06/11/2015   Dizziness and giddiness 04/04/2013   Abnormality of gait 02/15/2013   Spinal stenosis of cervical region 02/15/2013   Palpitations 11/28/2012   History of supraventricular tachycardia 11/28/2012   Essential hypertension 11/28/2012   History of CVA (cerebrovascular accident)  11/28/2012    ONSET DATE: 11/16/21   REFERRING DIAG:  I16.0 (ICD-10-CM) - Hypertensive urgency  R29.818 (ICD-10-CM) - Focal neurological deficit  Z86.73 (ICD-10-CM) - History of CVA (cerebrovascular accident)    THERAPY DIAG:  Abnormality of gait  Muscle weakness (generalized)  Other lack of coordination  Hemiplegia and hemiparesis following cerebral infarction affecting left non-dominant side (HCC)  Rationale for Evaluation and Treatment Rehabilitation  PERTINENT HISTORY: hypertension, not on any medications currently, hyperlipidemia, vertigo, patient with previous history of TIA in 2016, with her MRA head significant for few areas of focal stenosis, no acute findings however CT significant for chronic left lacunar cerebral infarct, blood pressure was elevated 215/115  PRECAUTIONS: Fall  SUBJECTIVE: Pt had PT prior  PAIN:  Are you having pain?  Yes - "just the hands and the feet that always hurt - it's chronic"     OBJECTIVE:   TODAY'S TREATMENT: 12/01/21 Coordination HEP Constant Therapy Alternating Symbols level 6 with 90% accuracy and 97.35s response time.    HOME EXERCISE PROGRAM: 12/01/21 Coordination HEP   GOALS: Goals reviewed with patient? No   SHORT TERM GOALS: Target date: 12/22/2021   Pt will be independent with HEP targeting LUE coordination and proximal strength  Baseline: Goal status: INITIAL   2.  Pt will verbalize understanding of adapted strategies and/or equipment  PRN to increase safety and independence with ADLs and IADLs (I.e. home management tasks, bathing etc)  Baseline:  Goal status: INITIAL     LONG TERM GOALS: Target date: 02/02/2022   Pt will be independent with updated HEPs. Baseline:  Goal status: INITIAL   2.  Pt will complete all basic ADLs with modified independence and good safety awareness. Baseline:  Goal status: INITIAL   3.  Pt will increase fine motor coordination in LUE  by improving 9 hole peg test by at least 5  seconds   Baseline: Right: 25.25 sec; Left: 31.33 sec Goal status: INITIAL   4.  Pt will increase strength in LUE shoulder flexion evidenced by ability to reach and grab an item weight at least 3 lbs from mid/high level shelf. Baseline:  Goal status: INITIAL   5.  Pt will increase ability to complete two tasks simultaneously by completing dual tasks and/or alternating attention tasks with 90% accuracy or greater. Baseline: diff with trail making b Goal status: INITIAL   ASSESSMENT:   CLINICAL IMPRESSION: Pt has verbalized understanding of goals.    PERFORMANCE DEFICITS in functional skills including ADLs, IADLs, coordination, dexterity, ROM, FMC, GMC, balance, decreased knowledge of use of DME, and UE functional use, cognitive skills including attention, safety awareness, and understand   IMPAIRMENTS are limiting patient from ADLs, IADLs, and leisure.    COMORBIDITIES may have co-morbidities  that affects occupational performance. Patient will benefit from skilled OT to address above impairments and improve overall function.   MODIFICATION OR ASSISTANCE TO COMPLETE EVALUATION: Min-Moderate modification of tasks or assist with assess necessary to complete an evaluation.   OT OCCUPATIONAL PROFILE AND HISTORY: Detailed assessment: Review of records and additional review of physical, cognitive, psychosocial history related to current functional performance.   CLINICAL DECISION MAKING: Moderate - several treatment options, min-mod task modification necessary   REHAB POTENTIAL: Good   EVALUATION COMPLEXITY: Moderate       PLAN: OT FREQUENCY: 2x/week   OT DURATION: 10 weeks 16 visits over 10 weeks d/t any scheduling conflicts   PLANNED INTERVENTIONS: self care/ADL training, therapeutic exercise, therapeutic activity, neuromuscular re-education, manual therapy, ultrasound, fluidotherapy, patient/family education, cognitive remediation/compensation, visual/perceptual  remediation/compensation, energy conservation, and DME and/or AE instructions   RECOMMENDED OTHER SERVICES: had ST and PT evaluation this day   CONSULTED AND AGREED WITH PLAN OF CARE: Patient and family member/caregiver   PLAN FOR NEXT SESSION: dual tasks/alternating attention, LUE proximal strengthening (light)   Zachery Conch, OT 12/01/2021, 3:19 PM

## 2021-12-01 NOTE — Patient Instructions (Signed)
Basic Activities:    Use your affected hand to perform the following activities for 20-30 minutes 1-2 times/day.  Stop activity if you experience pain.   - Flip playing cards - Deal top card with thumb - Toss ball - Rotate ball in your hand  - Turn doorknob - Open/close cabinet door with handle - Pick up coins and place in a container - Stack coins (stacks of 5) and manipulate one at a time to fingertips to place in bank/container - Fold towels - Stack blocks - Pick up 1-inch blocks - Put empty clothes hangers on a rack, remove, and repeat    

## 2021-12-01 NOTE — Therapy (Signed)
OUTPATIENT PHYSICAL THERAPY TREATMENT NOTE   Patient Name: [name] MRN: [id number] DOB:[birth year], 76 y.o., female Today's Date: 12/01/2021  PCP: Renford Dills, MD REFERRING PROVIDER: Renford Dills, MD  END OF SESSION:   PT End of Session - 12/01/21 1318     Visit Number 2    Number of Visits 17    Date for PT Re-Evaluation 01/18/22    Authorization Type Medicare part A& B    PT Start Time 1315    PT Stop Time 1400    PT Time Calculation (min) 45 min    Activity Tolerance Patient tolerated treatment well    Behavior During Therapy [facility] for tasks assessed/performed;Anxious             Past Medical History:  Diagnosis Date   Anxiety    Arthritis    Balance problem    Depression    Double vision 10/2014   bilateral   Dyslipidemia    High blood pressure    Multiple thyroid nodules    OSA (obstructive sleep apnea)    Paroxysmal SVT (supraventricular tachycardia) (HCC)    PVC (premature ventricular contraction)    intermittent   Syncope and collapse    last friday had alot of dizziness   Past Surgical History:  Procedure Laterality Date   ABDOMINAL HYSTERECTOMY     catheter ablation     TONSILLECTOMY     Patient Active Problem List   Diagnosis Date Noted   Acute CVA (cerebrovascular accident) (HCC) 11/17/2021   Hypertensive urgency 11/16/2021   Focal neurological deficit 11/16/2021   Paroxysmal atrial tachycardia (HCC) 06/18/2021   Chest pain of uncertain etiology 06/18/2021   Mixed hyperlipidemia 06/18/2021   TIA (transient ischemic attack) 06/12/2015   Amaurosis fugax    Atypical migraine    Amaurosis fugax of left eye 06/11/2015   Dizziness and giddiness 04/04/2013   Abnormality of gait 02/15/2013   Spinal stenosis of cervical region 02/15/2013   Palpitations 11/28/2012   History of supraventricular tachycardia 11/28/2012   Essential hypertension 11/28/2012   History of CVA (cerebrovascular accident) 11/28/2012    REFERRING DIAG:  I16.0  (ICD-10-CM) - Hypertensive urgency  I63.9 (ICD-10-CM) - Acute CVA (cerebrovascular accident) (HCC)    THERAPY DIAG:  Abnormality of gait  Muscle weakness (generalized)  Dizziness and giddiness  Rationale for Evaluation and Treatment Rehabilitation  PERTINENT HISTORY: HTN, hyperlipidemia, previous TIA in 2016, Mnire's disease, SVT, prediabetes, medication non-adherence.   PRECAUTIONS: fall  SUBJECTIVE: Patient reports doing well, but did have a fall last Friday where her clothes got caught on door knob. She does have a bruise on her L proximal arm. She did cancel a PCP appt for Monday d/t feeling nauseous.   PAIN:  Are you having pain? No  Vitals:  -BP: 167/99  -rest 5': 140/77    TODAYS TREATMENT:  VOR   -x1 horizontal (only able tolerate ~15s before eyes slipping; reports dizziness at ~2/10)   X1 vertical (tolerated ~25s with less dizziness compared to horizontal)  Saccades re-training: horizontal, vertical, diagonal  Suboccipital muscle tone assessment- increased tone noted to R side    PATIENT EDUCATION: Education details: rehab prognosis, vestib re-training, HEP  Person educated: Patient and Spouse Education method: Explanation, Demonstration, and Verbal cues Education comprehension: verbalized understanding, returned demonstration, and needs further education     HOME EXERCISE PROGRAM: Access Code: [email or web address] URL: [email or web address] Date: 12/01/2021 Prepared by: Merry Lofty  Exercises - Seated Gaze Stabilization with Head Rotation  - 1  x daily - 7 x weekly - 3 sets - 10 reps - Seated Horizontal Saccades  - 1 x daily - 7 x weekly - 3 sets - 10 reps - Seated Vertical Saccades  - 1 x daily - 7 x weekly - 3 sets - 10 reps - Seated Diagonal Saccades  - 1 x daily - 7 x weekly - 3 sets - 10 reps     GOALS: Goals reviewed with patient? Yes   SHORT TERM GOALS: Target date: 12/07/2021   Pt will be independent with balance/strength HEP to  minimize risk for falling at home.    Baseline: To be established Goal status: INITIAL   2.  Pt will improve gait speed to >/= .35  m/sec to demonstrate improved community ambulation  Baseline: .26m/s Goal status: INITIAL   3.  Patient will improve FGA score to >/= 19/30 for improved dynamic and ambulatory balance Baseline: 14/30 Goal status: INITIAL       LONG TERM GOALS: Target date: 01/18/2022   Pt will improve gait speed to >/= 1 m/sec to demonstrate improved community ambulation  Baseline: .2m/s Goal status: INITIAL   2.  Patient will improve FGA score to >/= 28/30 Baseline: 14/30 Goal status: INITIAL   3.  Patient will be able to negotiate a full flight of stairs with no HR use to allow for improved access to her home environment. Baseline: 12 steps with R HR use and SBA Goal status: INITIAL   ASSESSMENT:   CLINICAL IMPRESSION:  Patient seen for skilled physicla therapy with emphasis on VOR and balance retraining. Patient reporting increased anxiety regarding remaining vestibular symptoms. PT educating patient on rehab prognosis. Patient performing VOR and saccade retraining. She had increased symptom report with horizontal VOR x1, difficulty coordinating VOR x2 to fully assess symptom provocation. PT providing patient with brief HEP. She does have increased suboccipital tone consistent with patient reports of increased tension in that area with increased dizziness. Patient tolerating therapy well, continue POC.    OBJECTIVE IMPAIRMENTS Abnormal gait, decreased activity tolerance, decreased balance, decreased coordination, and dizziness.    ACTIVITY LIMITATIONS carrying, lifting, bending, sitting, standing, squatting, sleeping, stairs, transfers, bed mobility, and locomotion level   PARTICIPATION LIMITATIONS: meal prep, interpersonal relationship, driving, shopping, community activity, and yard work   PERSONAL FACTORS Age and 3+ comorbidities: HTN, Mnire's disease,  hyperlipidemia,   are also affecting patient's functional outcome.    REHAB POTENTIAL: Good   CLINICAL DECISION MAKING: Evolving/moderate complexity   EVALUATION COMPLEXITY: Moderate   PLAN: PT FREQUENCY: 2x/week   PT DURATION: 8 weeks   PLANNED INTERVENTIONS: Therapeutic exercises, Therapeutic activity, Neuromuscular re-education, Balance training, Gait training, Patient/Family education, Joint mobilization, Stair training, Vestibular training, Canalith repositioning, Visual/preceptual remediation/compensation, DME instructions, Aquatic Therapy, Cryotherapy, Moist heat, Manual therapy, and Re-evaluation   PLAN FOR NEXT SESSION: provide patient with HEP, begin dynamic/ambulatory balance re-training, progress gait without AD as able.      Westley Foots, PT Westley Foots, PT, DPT, CBIS  12/01/2021, 2:02 PM

## 2021-12-07 ENCOUNTER — Ambulatory Visit: Payer: Medicare Other | Admitting: Occupational Therapy

## 2021-12-07 ENCOUNTER — Ambulatory Visit: Payer: Medicare Other

## 2021-12-07 DIAGNOSIS — R269 Unspecified abnormalities of gait and mobility: Secondary | ICD-10-CM

## 2021-12-07 DIAGNOSIS — M6281 Muscle weakness (generalized): Secondary | ICD-10-CM

## 2021-12-07 DIAGNOSIS — R278 Other lack of coordination: Secondary | ICD-10-CM

## 2021-12-07 NOTE — Therapy (Signed)
OUTPATIENT PHYSICAL THERAPY TREATMENT NOTE   Patient Name: [name] MRN: [id number] DOB:Apr 21, 1946, 76 y.o., female Today's Date: 12/07/2021  PCP: Seward Carol, MD REFERRING PROVIDER: Seward Carol, MD  END OF SESSION:   PT End of Session - 12/07/21 1318     Visit Number 3    Number of Visits 17    Date for PT Re-Evaluation 01/18/22    Authorization Type Medicare part A& B    PT Start Time 1230    PT Stop Time 1315    PT Time Calculation (min) 45 min    Equipment Utilized During Treatment Gait belt    Activity Tolerance Patient tolerated treatment well    Behavior During Therapy WFL for tasks assessed/performed;Anxious              Past Medical History:  Diagnosis Date   Anxiety    Arthritis    Balance problem    Depression    Double vision 10/2014   bilateral   Dyslipidemia    High blood pressure    Multiple thyroid nodules    OSA (obstructive sleep apnea)    Paroxysmal SVT (supraventricular tachycardia) (HCC)    PVC (premature ventricular contraction)    intermittent   Syncope and collapse    last friday had alot of dizziness   Past Surgical History:  Procedure Laterality Date   ABDOMINAL HYSTERECTOMY     catheter ablation     TONSILLECTOMY     Patient Active Problem List   Diagnosis Date Noted   Acute CVA (cerebrovascular accident) ([place]) 11/17/2021   Hypertensive urgency 11/16/2021   Focal neurological deficit 11/16/2021   Paroxysmal atrial tachycardia ([place]) 06/18/2021   Chest pain of uncertain etiology 53/74/8270   Mixed hyperlipidemia 06/18/2021   TIA (transient ischemic attack) 06/12/2015   Amaurosis fugax    Atypical migraine    Amaurosis fugax of left eye 06/11/2015   Dizziness and giddiness 04/04/2013   Abnormality of gait 02/15/2013   Spinal stenosis of cervical region 02/15/2013   Palpitations 11/28/2012   History of supraventricular tachycardia 11/28/2012   Essential hypertension 11/28/2012   History of CVA  (cerebrovascular accident) 11/28/2012    REFERRING DIAG:  I16.0 (ICD-10-CM) - Hypertensive urgency  I63.9 (ICD-10-CM) - Acute CVA (cerebrovascular accident) ([place])    THERAPY DIAG:  Abnormality of gait  Muscle weakness (generalized)  Rationale for Evaluation and Treatment Rehabilitation  PERTINENT HISTORY: HTN, hyperlipidemia, previous TIA in 2016, Mnire's disease, SVT, prediabetes, medication non-adherence.   PRECAUTIONS: fall  SUBJECTIVE: Patient with cardiac monitor on ([employer]). "Some days are better than others"   PAIN:  Are you having pain? No  Vitals:  BP beginning of session: 154/77     TODAYS TREATMENT:  Goal assessment:     [facility] PT Assessment - 12/07/21 0001       Functional Gait  Assessment   Gait assessed  Yes    Gait Level Surface Walks 20 ft in less than 7 sec but greater than 5.5 sec, uses assistive device, slower speed, mild gait deviations, or deviates 6-10 in outside of the 12 in walkway width.    Change in Gait Speed Able to smoothly change walking speed without loss of balance or gait deviation. Deviate no more than 6 in outside of the 12 in walkway width.    Gait with Horizontal Head Turns Performs head turns smoothly with slight change in gait velocity (eg, minor disruption to smooth gait path), deviates 6-10 in outside 12 in walkway width,  or uses an assistive device.    Gait with Vertical Head Turns Performs task with slight change in gait velocity (eg, minor disruption to smooth gait path), deviates 6 - 10 in outside 12 in walkway width or uses assistive device    Gait and Pivot Turn Pivot turns safely within 3 sec and stops quickly with no loss of balance.    Step Over Obstacle Is able to step over one shoe box (4.5 in total height) without changing gait speed. No evidence of imbalance.    Gait with Narrow Base of Support Ambulates 7-9 steps.    Gait with Eyes Closed Walks 20 ft, uses assistive device, slower speed, mild gait deviations, deviates 6-10  in outside 12 in walkway width. Ambulates 20 ft in less than 9 sec but greater than 7 sec.    Ambulating Backwards Walks 20 ft, uses assistive device, slower speed, mild gait deviations, deviates 6-10 in outside 12 in walkway width.    Steps Alternating feet, must use rail.    Total Score 22             -Gait speed: .33m/s  -asked to re-assess oculomotor function: smooth pursuit normal; saccades remain abnormal to the R (hypometric)  Standing Balance: Surface: Floor and Pillows Position: Narrow Base of Support Feet Hip Width Apart Wide Base of Support Completed with: Eyes Open and Eyes Closed; Head Turns x 2x30 Reps and Head Nods x 2x30 Reps -very quick LOB to the L with poor and inadequate balance strategies noted with eyes closed, especially on uneven surface  Tandem Stance:  Surface: Floor and Pillows Completed with: Eyes Open and Eyes Closed; Head Turns x 2x30 Reps and Head Nods x 2x30 Reps  -very quick LOB to the L with poor and inadequate balance strategies noted with eyes closed, especially on uneven surface   PATIENT EDUCATION: Education details: OM results, HEP  Person educated: Patient and Spouse Education method: Explanation, Demonstration, and Verbal cues Education comprehension: verbalized understanding, returned demonstration, and needs further education     HOME EXERCISE PROGRAM: Access Code: [email or web address] URL: [email or web address] Date: 12/01/2021 Prepared by: Estevan Ryder  Exercises - Seated Gaze Stabilization with Head Rotation  - 1 x daily - 7 x weekly - 3 sets - 10 reps - Seated Horizontal Saccades  - 1 x daily - 7 x weekly - 3 sets - 10 reps - Seated Vertical Saccades  - 1 x daily - 7 x weekly - 3 sets - 10 reps - Seated Diagonal Saccades  - 1 x daily - 7 x weekly - 3 sets - 10 reps - Standing Balance in Corner  - 1 x daily - 7 x weekly - 3 sets - 10 reps - Standing Balance in Corner with Eyes Closed  - 1 x daily - 7 x weekly - 3 sets - 10  reps - Semi-Tandem Corner Balance With Eyes Open  - 1 x daily - 7 x weekly - 3 sets - 10 reps - Corner Balance Feet Together With Eyes Open  - 1 x daily - 7 x weekly - 3 sets - 10 reps - Corner Balance Feet Together: Eyes Open With Head Turns  - 1 x daily - 7 x weekly - 3 sets - 10 reps - Corner Balance Feet Together With Eyes Closed  - 1 x daily - 7 x weekly - 3 sets - 10 reps - Semi-Tandem Corner Balance: Eyes Open With Head Turns  - 1 x daily - 7  x weekly - 3 sets - 10 reps - Corner Balance Feet Apart: Eyes Closed With Head Turns  - 1 x daily - 7 x weekly - 3 sets - 10 reps     GOALS: Goals reviewed with patient? Yes   SHORT TERM GOALS: Target date: 12/07/2021   Pt will be independent with balance/strength HEP to minimize risk for falling at home.  Baseline: established Goal status: MET   2.  Pt will improve gait speed to >/= .35  m/sec to demonstrate improved community ambulation  Baseline: .35ms; .326m 12/07/21 Goal status: MET   3.  Patient will improve FGA score to >/= 19/30 for improved dynamic and ambulatory balance Baseline: 14/30; 22/30 12/07/21 Goal status: MET       LONG TERM GOALS: Target date: 01/18/2022   Pt will improve gait speed to >/= 1 m/sec to demonstrate improved community ambulation  Baseline: .2464mGoal status: INITIAL   2.  Patient will improve FGA score to >/= 28/30 Baseline: 14/30 Goal status: INITIAL   3.  Patient will be able to negotiate a full flight of stairs with no HR use to allow for improved access to her home environment. Baseline: 12 steps with R HR use and SBA Goal status: INITIAL   ASSESSMENT:   CLINICAL IMPRESSION:  Patient seen for skilled physical therapy session with emphasis on balance retraining and goal assessment. Patient has met 3/3 short term goals. Patient demonstrates increased fall risk as noted by score of 22/30 on  Functional Gait Assessment.   <22/30 = predictive of falls, <20/30 = fall in 6 months, <18/30 =  predictive of falls in PD MCID: 5 points stroke population, 4 points geriatric population (ANPTA Core Set of Outcome Measures for Adults with Neurologic Conditions, 2018). 10 Meter Walk Test: Average Normal speed: .38 m/s Cut off scores: <0.4 m/s = household Ambulator, 0.4-0.8 m/s = limited community Ambulator, >0.8 m/s = community Ambulator, >1.2 m/s = crossing a street, <1.0 = increased fall risk MCID 0.05 m/s (small), 0.13 m/s (moderate), 0.06 m/s (significant)  (ANPTA Core Set of Outcome Measures for Adults with Neurologic Conditions, 2018). Patient noted to have very poor reactionary balance strategies during corner balance exercises. This is likely due to effects of CVA + h/o Mnire's dx. Continue POC.      OBJECTIVE IMPAIRMENTS Abnormal gait, decreased activity tolerance, decreased balance, decreased coordination, and dizziness.    ACTIVITY LIMITATIONS carrying, lifting, bending, sitting, standing, squatting, sleeping, stairs, transfers, bed mobility, and locomotion level   PARTICIPATION LIMITATIONS: meal prep, interpersonal relationship, driving, shopping, community activity, and yard work   PERSONAL FACTORS Age and 3+ comorbidities: HTN, Mnire's disease, hyperlipidemia,   are also affecting patient's functional outcome.    REHAB POTENTIAL: Good   CLINICAL DECISION MAKING: Evolving/moderate complexity   EVALUATION COMPLEXITY: Moderate   PLAN: PT FREQUENCY: 2x/week   PT DURATION: 8 weeks   PLANNED INTERVENTIONS: Therapeutic exercises, Therapeutic activity, Neuromuscular re-education, Balance training, Gait training, Patient/Family education, Joint mobilization, Stair training, Vestibular training, Canalith repositioning, Visual/preceptual remediation/compensation, DME instructions, Aquatic Therapy, Cryotherapy, Moist heat, Manual therapy, and Re-evaluation   PLAN FOR NEXT SESSION: provide patient with HEP, begin dynamic/ambulatory balance re-training, progress gait without AD  as able.      JenDebbora DusT JenDebbora DusT, DPT, CBIS  12/07/2021, 2:12 PM

## 2021-12-07 NOTE — Therapy (Signed)
OUTPATIENT OCCUPATIONAL THERAPY TREATMENT NOTE   Patient Name: [name] MRN: [id number] DOB:09-09-1945, 76 y.o., female Today's Date: 12/07/2021  PCP: Renford Dills, MD REFERRING PROVIDER: Maretta Bees, MD   END OF SESSION:   OT End of Session - 12/07/21 1320     Visit Number 3    Number of Visits 17    Date for OT Re-Evaluation 02/02/22    Authorization Type Medicare A&B/[employer]    Progress Note Due on Visit 10    OT Start Time 1318    OT Stop Time 1400    OT Time Calculation (min) 42 min    Activity Tolerance Patient tolerated treatment well    Behavior During Therapy [facility] for tasks assessed/performed             Past Medical History:  Diagnosis Date   Anxiety    Arthritis    Balance problem    Depression    Double vision 10/2014   bilateral   Dyslipidemia    High blood pressure    Multiple thyroid nodules    OSA (obstructive sleep apnea)    Paroxysmal SVT (supraventricular tachycardia) (HCC)    PVC (premature ventricular contraction)    intermittent   Syncope and collapse    last friday had alot of dizziness   Past Surgical History:  Procedure Laterality Date   ABDOMINAL HYSTERECTOMY     catheter ablation     TONSILLECTOMY     Patient Active Problem List   Diagnosis Date Noted   Acute CVA (cerebrovascular accident) (HCC) 11/17/2021   Hypertensive urgency 11/16/2021   Focal neurological deficit 11/16/2021   Paroxysmal atrial tachycardia (HCC) 06/18/2021   Chest pain of uncertain etiology 06/18/2021   Mixed hyperlipidemia 06/18/2021   TIA (transient ischemic attack) 06/12/2015   Amaurosis fugax    Atypical migraine    Amaurosis fugax of left eye 06/11/2015   Dizziness and giddiness 04/04/2013   Abnormality of gait 02/15/2013   Spinal stenosis of cervical region 02/15/2013   Palpitations 11/28/2012   History of supraventricular tachycardia 11/28/2012   Essential hypertension 11/28/2012   History of CVA (cerebrovascular accident)  11/28/2012    ONSET DATE: 11/16/21   REFERRING DIAG:  I16.0 (ICD-10-CM) - Hypertensive urgency  R29.818 (ICD-10-CM) - Focal neurological deficit  Z86.73 (ICD-10-CM) - History of CVA (cerebrovascular accident)    THERAPY DIAG:  Other lack of coordination  Muscle weakness (generalized)  Rationale for Evaluation and Treatment Rehabilitation  PERTINENT HISTORY: hypertension, not on any medications currently, hyperlipidemia, vertigo, patient with previous history of TIA in 2016, with her MRA head significant for few areas of focal stenosis, no acute findings however CT significant for chronic left lacunar cerebral infarct, blood pressure was elevated 215/115  PRECAUTIONS: Fall, heart monitor  SUBJECTIVE: Doing the coordination ex's.   PAIN:  Are you having pain?  Yes - "just the hands and the feet that always hurt - it's chronic"  (O.T. not directly addressing d/t chronic OA)     TODAY'S TREATMENT:  Pt reports pain in bilateral thumbs d/t OA but worse in Lt thumb (suspect exacerbated from weakness d/t CVA). Discussed task modifications and A/E recommendations including: jar opener, electric can opener, hand chopper, making handles of pens/items larger when possible and modifications to holding pots/pans, wringing out washcloths. Recommended pt wear neoprene thumb braces when doing more demanding tasks (Pt reports she already has)  Assessed BUE high level ROM and scapula mobility - pt appears hypermobile Lt scapula.  Prone: bilateral scapula retraction x 10 w/ min cues. Prone on elbows: chest lift for scapula depression and sh girdle strengthening/stability. Prone w/ sh extension LUE (over EOB) w/ min tactile cues for scapula and posterior sh girdle strengthening      HOME EXERCISE PROGRAM: 12/01/21 Coordination HEP   GOALS: Goals reviewed with patient? No   SHORT TERM GOALS: Target date: 12/22/2021   Pt will be independent with HEP targeting LUE coordination and proximal  strength  Baseline: Goal status: IN PROGRESS   2.  Pt will verbalize understanding of adapted strategies and/or equipment PRN to increase safety and independence with ADLs and IADLs (I.e. home management tasks, bathing etc)  Baseline:  Goal status: IN PROGRESS     LONG TERM GOALS: Target date: 02/02/2022   Pt will be independent with updated HEPs. Baseline:  Goal status: INITIAL   2.  Pt will complete all basic ADLs with modified independence and good safety awareness. Baseline:  Goal status: INITIAL   3.  Pt will increase fine motor coordination in LUE  by improving 9 hole peg test by at least 5 seconds   Baseline: Right: 25.25 sec; Left: 31.33 sec Goal status: INITIAL   4.  Pt will increase strength in LUE shoulder flexion evidenced by ability to reach and grab an item weight at least 3 lbs from mid/high level shelf. Baseline:  Goal status: INITIAL   5.  Pt will increase ability to complete two tasks simultaneously by completing dual tasks and/or alternating attention tasks with 90% accuracy or greater. Baseline: diff with trail making b Goal status: INITIAL   ASSESSMENT:   CLINICAL IMPRESSION: Pt progressing towards STG's. Pt/husband with greater understanding of task modifications and A/E to minimize pain and help protect hands/thumbs   PERFORMANCE DEFICITS in functional skills including ADLs, IADLs, coordination, dexterity, ROM, FMC, GMC, balance, decreased knowledge of use of DME, and UE functional use, cognitive skills including attention, safety awareness, and understand   IMPAIRMENTS are limiting patient from ADLs, IADLs, and leisure.    COMORBIDITIES may have co-morbidities  that affects occupational performance. Patient will benefit from skilled OT to address above impairments and improve overall function.   MODIFICATION OR ASSISTANCE TO COMPLETE EVALUATION: Min-Moderate modification of tasks or assist with assess necessary to complete an evaluation.   OT  OCCUPATIONAL PROFILE AND HISTORY: Detailed assessment: Review of records and additional review of physical, cognitive, psychosocial history related to current functional performance.   CLINICAL DECISION MAKING: Moderate - several treatment options, min-mod task modification necessary   REHAB POTENTIAL: Good   EVALUATION COMPLEXITY: Moderate       PLAN: OT FREQUENCY: 2x/week   OT DURATION: 10 weeks 16 visits over 10 weeks d/t any scheduling conflicts   PLANNED INTERVENTIONS: self care/ADL training, therapeutic exercise, therapeutic activity, neuromuscular re-education, manual therapy, ultrasound, fluidotherapy, patient/family education, cognitive remediation/compensation, visual/perceptual remediation/compensation, energy conservation, and DME and/or AE instructions   RECOMMENDED OTHER SERVICES: had ST and PT evaluation this day   CONSULTED AND AGREED WITH PLAN OF CARE: Patient and family member/caregiver   PLAN FOR NEXT SESSION: dual tasks/alternating attention, LUE proximal strengthening (light) in prone (see above) - give as HEP once pt can do w/o cues   Sheran Lawless, OT 12/07/2021, 1:21 PM

## 2021-12-09 ENCOUNTER — Ambulatory Visit: Payer: Medicare Other

## 2021-12-09 ENCOUNTER — Encounter: Payer: Self-pay | Admitting: Occupational Therapy

## 2021-12-09 ENCOUNTER — Ambulatory Visit: Payer: Medicare Other | Admitting: Occupational Therapy

## 2021-12-09 DIAGNOSIS — R278 Other lack of coordination: Secondary | ICD-10-CM

## 2021-12-09 DIAGNOSIS — I69354 Hemiplegia and hemiparesis following cerebral infarction affecting left non-dominant side: Secondary | ICD-10-CM

## 2021-12-09 DIAGNOSIS — R269 Unspecified abnormalities of gait and mobility: Secondary | ICD-10-CM | POA: Diagnosis not present

## 2021-12-09 DIAGNOSIS — M6281 Muscle weakness (generalized): Secondary | ICD-10-CM

## 2021-12-09 NOTE — Therapy (Signed)
OUTPATIENT PHYSICAL THERAPY TREATMENT NOTE   Patient Name: [name] MRN: [id number] DOB:[birth year], 76 y.o., female Today's Date: 12/09/2021  PCP: Seward Carol, MD REFERRING PROVIDER: Seward Carol, MD  END OF SESSION:   PT End of Session - 12/09/21 1102     Visit Number 4    Number of Visits 17    Date for PT Re-Evaluation 01/18/22    Authorization Type Medicare part A& B    PT Start Time 1101    PT Stop Time 1145    PT Time Calculation (min) 44 min    Activity Tolerance Patient tolerated treatment well    Behavior During Therapy [facility] for tasks assessed/performed;Anxious               Past Medical History:  Diagnosis Date   Anxiety    Arthritis    Balance problem    Depression    Double vision 10/2014   bilateral   Dyslipidemia    High blood pressure    Multiple thyroid nodules    OSA (obstructive sleep apnea)    Paroxysmal SVT (supraventricular tachycardia) (HCC)    PVC (premature ventricular contraction)    intermittent   Syncope and collapse    last friday had alot of dizziness   Past Surgical History:  Procedure Laterality Date   ABDOMINAL HYSTERECTOMY     catheter ablation     TONSILLECTOMY     Patient Active Problem List   Diagnosis Date Noted   Acute CVA (cerebrovascular accident) ([place]) 11/17/2021   Hypertensive urgency 11/16/2021   Focal neurological deficit 11/16/2021   Paroxysmal atrial tachycardia ([place]) 06/18/2021   Chest pain of uncertain etiology 81/44/8185   Mixed hyperlipidemia 06/18/2021   TIA (transient ischemic attack) 06/12/2015   Amaurosis fugax    Atypical migraine    Amaurosis fugax of left eye 06/11/2015   Dizziness and giddiness 04/04/2013   Abnormality of gait 02/15/2013   Spinal stenosis of cervical region 02/15/2013   Palpitations 11/28/2012   History of supraventricular tachycardia 11/28/2012   Essential hypertension 11/28/2012   History of CVA (cerebrovascular accident) 11/28/2012    REFERRING DIAG:   I16.0 (ICD-10-CM) - Hypertensive urgency  I63.9 (ICD-10-CM) - Acute CVA (cerebrovascular accident) ([place])    THERAPY DIAG:  Abnormality of gait  Muscle weakness (generalized)  Rationale for Evaluation and Treatment Rehabilitation  PERTINENT HISTORY: HTN, hyperlipidemia, previous TIA in 2016, Mnire's disease, SVT, prediabetes, medication non-adherence.   PRECAUTIONS: fall  SUBJECTIVE:Patient reports doing well- denies falls. Patient walking into clinic without RW.   PAIN:  Are you having pain? No  Vitals:  BP: 165/64  2' rest: 150/70    TODAYS TREATMENT: SOT:   -COG preferentially L anterior   -above age-matched norm for somatosensory and vision  -below age-matched norm for vestibular  -composite score: 57 (below age-matched norm)   Standing Balance: Surface: Floor Position: Narrow Base of Support Completed with: Eyes Open and Eyes Closed; Head Turns x 10 Reps and Head Nods x 10 Reps - patient requiring verbal cues to shift weight back to R with ankle strategy re-training    PATIENT EDUCATION: Education details: OM results, HEP  Person educated: Patient and Spouse Education method: Explanation, Demonstration, and Verbal cues Education comprehension: verbalized understanding, returned demonstration, and needs further education     HOME EXERCISE PROGRAM: Access Code: [email or web address] URL: [email or web address] Date: 12/01/2021 Prepared by: Estevan Ryder  Exercises - Seated Gaze Stabilization with Head Rotation  - 1 x daily - 7 x weekly -  3 sets - 10 reps - Seated Horizontal Saccades  - 1 x daily - 7 x weekly - 3 sets - 10 reps - Seated Vertical Saccades  - 1 x daily - 7 x weekly - 3 sets - 10 reps - Seated Diagonal Saccades  - 1 x daily - 7 x weekly - 3 sets - 10 reps - Standing Balance in Corner  - 1 x daily - 7 x weekly - 3 sets - 10 reps - Standing Balance in Corner with Eyes Closed  - 1 x daily - 7 x weekly - 3 sets - 10 reps - Semi-Tandem Corner  Balance With Eyes Open  - 1 x daily - 7 x weekly - 3 sets - 10 reps - Corner Balance Feet Together With Eyes Open  - 1 x daily - 7 x weekly - 3 sets - 10 reps - Corner Balance Feet Together: Eyes Open With Head Turns  - 1 x daily - 7 x weekly - 3 sets - 10 reps - Corner Balance Feet Together With Eyes Closed  - 1 x daily - 7 x weekly - 3 sets - 10 reps - Semi-Tandem Corner Balance: Eyes Open With Head Turns  - 1 x daily - 7 x weekly - 3 sets - 10 reps - Corner Balance Feet Apart: Eyes Closed With Head Turns  - 1 x daily - 7 x weekly - 3 sets - 10 reps     GOALS: Goals reviewed with patient? Yes   SHORT TERM GOALS: Target date: 12/07/2021   Pt will be independent with balance/strength HEP to minimize risk for falling at home.  Baseline: established Goal status: MET   2.  Pt will improve gait speed to >/= .35  m/sec to demonstrate improved community ambulation  Baseline: .66ms; .318m 12/07/21 Goal status: MET   3.  Patient will improve FGA score to >/= 19/30 for improved dynamic and ambulatory balance Baseline: 14/30; 22/30 12/07/21 Goal status: MET       LONG TERM GOALS: Target date: 01/18/2022   Pt will improve gait speed to >/= 1 m/sec to demonstrate improved community ambulation  Baseline: .2431mGoal status: INITIAL   2.  Patient will improve FGA score to >/= 28/30 Baseline: 14/30 Goal status: INITIAL   3.  Patient will be able to negotiate a full flight of stairs with no HR use to allow for improved access to her home environment. Baseline: 12 steps with R HR use and SBA Goal status: INITIAL   ASSESSMENT:   CLINICAL IMPRESSION:  Patient seen for skilled PT session with emphasis on balance and vestibular re-training. SOT results noting that vestibular system is functioning below age-matched norm. Patient improving static balance in corner on solid ground with eyes closed. Previous session- patient with quick LOB to the L with little to no balance strategy. With verbal  cues to "ground her R foot" Patient able to maintain balance for >30s. Patient able to maintain dynamic standing balance eyes closed with horizontal and vertical head turns for 15-25s- also a significant improvement. Continue POC.      OBJECTIVE IMPAIRMENTS Abnormal gait, decreased activity tolerance, decreased balance, decreased coordination, and dizziness.    ACTIVITY LIMITATIONS carrying, lifting, bending, sitting, standing, squatting, sleeping, stairs, transfers, bed mobility, and locomotion level   PARTICIPATION LIMITATIONS: meal prep, interpersonal relationship, driving, shopping, community activity, and yard work   PERSONAL FACTORS Age and 3+ comorbidities: HTN, Mnire's disease, hyperlipidemia,   are also affecting patient's functional  outcome.    REHAB POTENTIAL: Good   CLINICAL DECISION MAKING: Evolving/moderate complexity   EVALUATION COMPLEXITY: Moderate   PLAN: PT FREQUENCY: 2x/week   PT DURATION: 8 weeks   PLANNED INTERVENTIONS: Therapeutic exercises, Therapeutic activity, Neuromuscular re-education, Balance training, Gait training, Patient/Family education, Joint mobilization, Stair training, Vestibular training, Canalith repositioning, Visual/preceptual remediation/compensation, DME instructions, Aquatic Therapy, Cryotherapy, Moist heat, Manual therapy, and Re-evaluation   PLAN FOR NEXT SESSION: ankle/hip/stepping strategy, corner exercises     Debbora Dus, PT Debbora Dus, PT, DPT, CBIS  12/09/2021, 11:53 AM

## 2021-12-09 NOTE — Patient Instructions (Signed)
Memory Compensation Strategies  Use "WARM" strategy.  W= write it down  A= associate it  R= repeat it  M= make a mental note  2.   You can keep a Glass blower/designer.  Use a 3-ring notebook with sections for the following: calendar, important names and phone numbers,  medications, doctors' names/phone numbers, lists/reminders, and a section to journal what you did each day.   3.    Use a calendar to write appointments down.  4.    Write yourself a schedule for the day.  This can be placed on the calendar or in a separate section of the Memory Notebook.  Keeping a regular schedule can help memory.  5.    Use medication organizer with sections for each day or morning/evening pills.  You may need help loading it  6.    Keep a basket, or pegboard by the door.  Place items that you need to take out with you in the basket or on the pegboard.  You may also want to  include a message board for reminders.  7.    Use sticky notes.  Place sticky notes with reminders in a place where the task is performed.  For example: " turn off the stove" placed by the stove, "lock the door" placed on the door at eye level, " take your medications" on the bathroom mirror or by the place where you normally take your medications.  8.    Use alarms/timers.  Use while cooking to remind yourself to check on food or as a reminder to take your medicine, or as a  reminder to make a call, or as a reminder to perform another task, etc.   Keeping Thinking Skills Sharp: 1. Jigsaw puzzles 2. Card/board games 3. Talking on the phone/social events 4. Lumosity.com 5. Online games 6. Word serches/crossword puzzles 7.  Logic puzzles 8. Aerobic exercise (stationary bike) 9. Eating balanced diet (fruits & veggies) 10. Drink water 11. Try something new--new recipe, hobby 12. Crafts 13. Do a variety of activities that are challenging 14.  Plan weekly meals and write a grocery list 15. Add cognitive activities to  walking/exercising (think of animal/food/city with each letter of the alphabet, counting backwards, thinking of as many vegetables as you can, etc.).--Only do this  If safe (no freezing/falls).

## 2021-12-09 NOTE — Therapy (Signed)
OUTPATIENT OCCUPATIONAL THERAPY TREATMENT NOTE   Patient Name: [name] MRN: [id number] DOB:1945-08-30, 76 y.o., female Today's Date: 12/09/2021  PCP: Renford Dills, MD REFERRING PROVIDER: Maretta Bees, MD   END OF SESSION:   OT End of Session - 12/09/21 1018     Visit Number 4    Number of Visits 17    Date for OT Re-Evaluation 02/02/22    Authorization Type Medicare A&B/Tricare    Progress Note Due on Visit 10    OT Start Time 1016    OT Stop Time 1100    OT Time Calculation (min) 44 min    Activity Tolerance Patient tolerated treatment well    Behavior During Therapy [facility] for tasks assessed/performed             Past Medical History:  Diagnosis Date   Anxiety    Arthritis    Balance problem    Depression    Double vision 10/2014   bilateral   Dyslipidemia    High blood pressure    Multiple thyroid nodules    OSA (obstructive sleep apnea)    Paroxysmal SVT (supraventricular tachycardia) (HCC)    PVC (premature ventricular contraction)    intermittent   Syncope and collapse    last friday had alot of dizziness   Past Surgical History:  Procedure Laterality Date   ABDOMINAL HYSTERECTOMY     catheter ablation     TONSILLECTOMY     Patient Active Problem List   Diagnosis Date Noted   Acute CVA (cerebrovascular accident) (HCC) 11/17/2021   Hypertensive urgency 11/16/2021   Focal neurological deficit 11/16/2021   Paroxysmal atrial tachycardia (HCC) 06/18/2021   Chest pain of uncertain etiology 06/18/2021   Mixed hyperlipidemia 06/18/2021   TIA (transient ischemic attack) 06/12/2015   Amaurosis fugax    Atypical migraine    Amaurosis fugax of left eye 06/11/2015   Dizziness and giddiness 04/04/2013   Abnormality of gait 02/15/2013   Spinal stenosis of cervical region 02/15/2013   Palpitations 11/28/2012   History of supraventricular tachycardia 11/28/2012   Essential hypertension 11/28/2012   History of CVA (cerebrovascular accident)  11/28/2012    ONSET DATE: 11/16/21   REFERRING DIAG:  I16.0 (ICD-10-CM) - Hypertensive urgency  R29.818 (ICD-10-CM) - Focal neurological deficit  Z86.73 (ICD-10-CM) - History of CVA (cerebrovascular accident)    THERAPY DIAG:  Hemiplegia and hemiparesis following cerebral infarction affecting left non-dominant side (HCC)  Muscle weakness (generalized)  Other lack of coordination  Rationale for Evaluation and Treatment Rehabilitation  PERTINENT HISTORY: hypertension, not on any medications currently, hyperlipidemia, vertigo, patient with previous history of TIA in 2016, with her MRA head significant for few areas of focal stenosis, no acute findings however CT significant for chronic left lacunar cerebral infarct, blood pressure was elevated 215/115  PRECAUTIONS: Fall, heart monitor  SUBJECTIVE: I hurt all over; it's hard to tell. I am wearing the brace for my Lt thumb and it's helping  PAIN:  Are you having pain? Yes NPRS scale: 5/10 Pain location: lower back Pain orientation: Left  PAIN TYPE: aching and sharp Pain description: constant  Aggravating factors: walking Relieving factors: laying down      TODAY'S TREATMENT:  12/09/21: Reviewed and updated coordination ex's for Lt hand verbally and through demo. Pt return demo of each. Pt also instructed to toss/catch ball LUE for gross motor coordination  Issued memory compensation strategies and ways to keep thinking skills sharp and reviewed. See pt instructions.  Pt reports that she doesn't feel cognitively sharp however husband reports that she is too hard on herself and expects too much too soon from herself. Therapist encouraged pt that she is doing very well, especially considering she is only 3 weeks out from strokes. Therapist recommended going easy and gradually adding daily tasks that would work cognition functionally (meal planning, playing card games, sudoko, etc). Also discussed getting a good nights sleep, good  nutrition, and moderate exercise are all parts of the healing process. Pt also reports getting more emotional and became teary during one part of session. Therapist explained this can happen after stroke, however if it begins to impact function or sleep, she needs to talk to her doctor.    (12/07/21: Assessed BUE high level ROM and scapula mobility - pt appears hypermobile Lt scapula.  Prone: bilateral scapula retraction x 10 w/ min cues. Prone on elbows: chest lift for scapula depression and sh girdle strengthening/stability. Prone w/ sh extension LUE (over EOB) w/ min tactile cues for scapula and posterior sh girdle strengthening)      HOME EXERCISE PROGRAM: 12/01/21 Coordination HEP - (updated for more challenge on 12/09/21)  12/09/21: memory strategies, keeping thinking skills sharp   GOALS: Goals reviewed with patient? No   SHORT TERM GOALS: Target date: 12/22/2021   Pt will be independent with HEP targeting LUE coordination and proximal strength  Baseline: Goal status: IN PROGRESS   2.  Pt will verbalize understanding of adapted strategies and/or equipment PRN to increase safety and independence with ADLs and IADLs (I.e. home management tasks, bathing etc)  Baseline:  Goal status: IN PROGRESS     LONG TERM GOALS: Target date: 02/02/2022   Pt will be independent with updated HEPs. Baseline:  Goal status: INITIAL   2.  Pt will complete all basic ADLs with modified independence and good safety awareness. Baseline:  Goal status: INITIAL   3.  Pt will increase fine motor coordination in LUE  by improving 9 hole peg test by at least 5 seconds   Baseline: Right: 25.25 sec; Left: 31.33 sec Goal status: INITIAL   4.  Pt will increase strength in LUE shoulder flexion evidenced by ability to reach and grab an item weight at least 3 lbs from mid/high level shelf. Baseline:  Goal status: INITIAL   5.  Pt will increase ability to complete two tasks simultaneously by completing dual  tasks and/or alternating attention tasks with 90% accuracy or greater. Baseline: diff with trail making b Goal status: INITIAL   ASSESSMENT:   CLINICAL IMPRESSION: Pt progressing towards STG's. Pt/husband with greater understanding of task modifications and A/E to minimize pain and help protect hands/thumbs   PERFORMANCE DEFICITS in functional skills including ADLs, IADLs, coordination, dexterity, ROM, FMC, GMC, balance, decreased knowledge of use of DME, and UE functional use, cognitive skills including attention, safety awareness, and understand   IMPAIRMENTS are limiting patient from ADLs, IADLs, and leisure.    COMORBIDITIES may have co-morbidities  that affects occupational performance. Patient will benefit from skilled OT to address above impairments and improve overall function.   MODIFICATION OR ASSISTANCE TO COMPLETE EVALUATION: Min-Moderate modification of tasks or assist with assess necessary to complete an evaluation.   OT OCCUPATIONAL PROFILE AND HISTORY: Detailed assessment: Review of records and additional review of physical, cognitive, psychosocial history related to current functional performance.   CLINICAL DECISION MAKING: Moderate - several treatment options, min-mod task modification necessary   REHAB POTENTIAL: Good   EVALUATION COMPLEXITY: Moderate  PLAN: OT FREQUENCY: 2x/week   OT DURATION: 10 weeks 16 visits over 10 weeks d/t any scheduling conflicts   PLANNED INTERVENTIONS: self care/ADL training, therapeutic exercise, therapeutic activity, neuromuscular re-education, manual therapy, ultrasound, fluidotherapy, patient/family education, cognitive remediation/compensation, visual/perceptual remediation/compensation, energy conservation, and DME and/or AE instructions   RECOMMENDED OTHER SERVICES: had ST and PT evaluation this day   CONSULTED AND AGREED WITH PLAN OF CARE: Patient and family member/caregiver   PLAN FOR NEXT SESSION: dual  tasks/alternating attention, LUE proximal strengthening (light) in prone (see above from 12/07/21) - give as HEP once pt can do w/o cues, continue to work on gross motor coordination (tossing, dribbling LUE)    Sheran Lawless, OT 12/09/2021, 10:19 AM

## 2021-12-13 ENCOUNTER — Ambulatory Visit: Payer: Self-pay | Admitting: Physical Therapy

## 2021-12-15 ENCOUNTER — Encounter: Payer: Self-pay | Admitting: Occupational Therapy

## 2021-12-15 ENCOUNTER — Ambulatory Visit: Payer: Medicare Other | Admitting: Physical Therapy

## 2021-12-15 ENCOUNTER — Encounter: Payer: Self-pay | Admitting: Physical Therapy

## 2021-12-15 ENCOUNTER — Ambulatory Visit: Payer: Medicare Other | Admitting: Occupational Therapy

## 2021-12-15 VITALS — BP 151/72 | HR 73

## 2021-12-15 DIAGNOSIS — M6281 Muscle weakness (generalized): Secondary | ICD-10-CM

## 2021-12-15 DIAGNOSIS — R278 Other lack of coordination: Secondary | ICD-10-CM

## 2021-12-15 DIAGNOSIS — R42 Dizziness and giddiness: Secondary | ICD-10-CM

## 2021-12-15 DIAGNOSIS — I69354 Hemiplegia and hemiparesis following cerebral infarction affecting left non-dominant side: Secondary | ICD-10-CM

## 2021-12-15 DIAGNOSIS — R269 Unspecified abnormalities of gait and mobility: Secondary | ICD-10-CM | POA: Diagnosis not present

## 2021-12-15 NOTE — Therapy (Signed)
OUTPATIENT OCCUPATIONAL THERAPY TREATMENT NOTE   Patient Name: [name] MRN: [id number] DOB:1945/07/08, 76 y.o., female Today's Date: 12/15/2021  PCP: Renford Dills, MD REFERRING PROVIDER: Maretta Bees, MD   END OF SESSION:   OT End of Session - 12/15/21 1618     Visit Number 5    Number of Visits 17    Date for OT Re-Evaluation 02/02/22    Authorization Type Medicare A&B/Tricare    Progress Note Due on Visit 10    OT Start Time 1617    OT Stop Time 1700    OT Time Calculation (min) 43 min    Activity Tolerance Patient tolerated treatment well    Behavior During Therapy [facility] for tasks assessed/performed             Past Medical History:  Diagnosis Date   Anxiety    Arthritis    Balance problem    Depression    Double vision 10/2014   bilateral   Dyslipidemia    High blood pressure    Multiple thyroid nodules    OSA (obstructive sleep apnea)    Paroxysmal SVT (supraventricular tachycardia) (HCC)    PVC (premature ventricular contraction)    intermittent   Syncope and collapse    last friday had alot of dizziness   Past Surgical History:  Procedure Laterality Date   ABDOMINAL HYSTERECTOMY     catheter ablation     TONSILLECTOMY     Patient Active Problem List   Diagnosis Date Noted   Acute CVA (cerebrovascular accident) (HCC) 11/17/2021   Hypertensive urgency 11/16/2021   Focal neurological deficit 11/16/2021   Paroxysmal atrial tachycardia (HCC) 06/18/2021   Chest pain of uncertain etiology 06/18/2021   Mixed hyperlipidemia 06/18/2021   TIA (transient ischemic attack) 06/12/2015   Amaurosis fugax    Atypical migraine    Amaurosis fugax of left eye 06/11/2015   Dizziness and giddiness 04/04/2013   Abnormality of gait 02/15/2013   Spinal stenosis of cervical region 02/15/2013   Palpitations 11/28/2012   History of supraventricular tachycardia 11/28/2012   Essential hypertension 11/28/2012   History of CVA (cerebrovascular accident)  11/28/2012    ONSET DATE: 11/16/21   REFERRING DIAG:  I16.0 (ICD-10-CM) - Hypertensive urgency  R29.818 (ICD-10-CM) - Focal neurological deficit  Z86.73 (ICD-10-CM) - History of CVA (cerebrovascular accident)    THERAPY DIAG:  Hemiplegia and hemiparesis following cerebral infarction affecting left non-dominant side (HCC)  Muscle weakness (generalized)  Other lack of coordination  Rationale for Evaluation and Treatment Rehabilitation  PERTINENT HISTORY: hypertension, not on any medications currently, hyperlipidemia, vertigo, patient with previous history of TIA in 2016, with her MRA head significant for few areas of focal stenosis, no acute findings however CT significant for chronic left lacunar cerebral infarct, blood pressure was elevated 215/115  PRECAUTIONS: Fall, heart monitor  SUBJECTIVE: I hurt all over; it's hard to tell. I am wearing the brace for my Lt thumb and it's helping  PAIN:  Are you having pain? Yes NPRS scale: 5/10 Pain location: lower back Pain orientation: Left  PAIN TYPE: aching and sharp Pain description: constant  Aggravating factors: walking Relieving factors: laying down      TODAY'S TREATMENT:  12/15/21 Pt reports things are getting better with fine and gross motor tasks with LUE.  Blink Cards with sorting by shape, color and number with speed as a factor. Pt with good speed and attention to detail with this task with min verbal cues for recall  of 3 categories (shape, color and number).  24 pc puzzle ([employer]) Pt with mod cues req'd for completion of puzzle with attention to detail and decreasing speed for more accurate placement of puzzle pieces.   Constant Therapy Same Symbols level 10 with 55% accuracy and 70.29s response time.   Pt discouraged today with progress and awareness of deficits with attention. Pt encouraged to continue with Sudoku puzzles and familiar games with spouse to continue work on cognitive skills.     HOME  EXERCISE PROGRAM: 12/01/21 Coordination HEP - (updated for more challenge on 12/09/21)  12/09/21: memory strategies, keeping thinking skills sharp   GOALS: Goals reviewed with patient? No   SHORT TERM GOALS: Target date: 12/22/2021   Pt will be independent with HEP targeting LUE coordination and proximal strength  Baseline: Goal status: IN PROGRESS   2.  Pt will verbalize understanding of adapted strategies and/or equipment PRN to increase safety and independence with ADLs and IADLs (I.e. home management tasks, bathing etc)  Baseline:  Goal status: IN PROGRESS     LONG TERM GOALS: Target date: 02/02/2022   Pt will be independent with updated HEPs. Baseline:  Goal status: INITIAL   2.  Pt will complete all basic ADLs with modified independence and good safety awareness. Baseline:  Goal status: INITIAL   3.  Pt will increase fine motor coordination in LUE  by improving 9 hole peg test by at least 5 seconds   Baseline: Right: 25.25 sec; Left: 31.33 sec Goal status: INITIAL   4.  Pt will increase strength in LUE shoulder flexion evidenced by ability to reach and grab an item weight at least 3 lbs from mid/high level shelf. Baseline:  Goal status: INITIAL   5.  Pt will increase ability to complete two tasks simultaneously by completing dual tasks and/or alternating attention tasks with 90% accuracy or greater. Baseline: diff with trail making b Goal status: INITIAL   ASSESSMENT:   CLINICAL IMPRESSION: Pt progressing towards STG's however increased awareness into deficits and discouraged today. Encouraged to continue with puzzles and cognitive tasks/games.   PERFORMANCE DEFICITS in functional skills including ADLs, IADLs, coordination, dexterity, ROM, FMC, GMC, balance, decreased knowledge of use of DME, and UE functional use, cognitive skills including attention, safety awareness, and understand   IMPAIRMENTS are limiting patient from ADLs, IADLs, and leisure.    COMORBIDITIES  may have co-morbidities  that affects occupational performance. Patient will benefit from skilled OT to address above impairments and improve overall function.   MODIFICATION OR ASSISTANCE TO COMPLETE EVALUATION: Min-Moderate modification of tasks or assist with assess necessary to complete an evaluation.   OT OCCUPATIONAL PROFILE AND HISTORY: Detailed assessment: Review of records and additional review of physical, cognitive, psychosocial history related to current functional performance.   CLINICAL DECISION MAKING: Moderate - several treatment options, min-mod task modification necessary   REHAB POTENTIAL: Good   EVALUATION COMPLEXITY: Moderate       PLAN: OT FREQUENCY: 2x/week   OT DURATION: 10 weeks 16 visits over 10 weeks d/t any scheduling conflicts   PLANNED INTERVENTIONS: self care/ADL training, therapeutic exercise, therapeutic activity, neuromuscular re-education, manual therapy, ultrasound, fluidotherapy, patient/family education, cognitive remediation/compensation, visual/perceptual remediation/compensation, energy conservation, and DME and/or AE instructions   RECOMMENDED OTHER SERVICES: had ST and PT evaluation this day   CONSULTED AND AGREED WITH PLAN OF CARE: Patient and family member/caregiver   PLAN FOR NEXT SESSION: dual tasks/alternating attention, LUE proximal strengthening (light) in prone (see above from 12/07/21) -  give as HEP once pt can do w/o cues, continue to work on gross motor coordination (tossing, dribbling LUE)    Zachery Conch, OT 12/15/2021, 5:00 PM

## 2021-12-15 NOTE — Therapy (Signed)
OUTPATIENT PHYSICAL THERAPY TREATMENT NOTE   Patient Name: [name] MRN: [id number] DOB:1945-12-31, 76 y.o., female Today's Date: 12/16/2021  PCP: Seward Carol, MD REFERRING PROVIDER: Seward Carol, MD  END OF SESSION:   PT End of Session - 12/15/21 1535     Visit Number 5    Number of Visits 17    Date for PT Re-Evaluation 01/18/22    Authorization Type Medicare part A& B    PT Start Time 0076    PT Stop Time 1615    PT Time Calculation (min) 42 min    Activity Tolerance Patient tolerated treatment well    Behavior During Therapy [facility] for tasks assessed/performed;Anxious               Past Medical History:  Diagnosis Date   Anxiety    Arthritis    Balance problem    Depression    Double vision 10/2014   bilateral   Dyslipidemia    High blood pressure    Multiple thyroid nodules    OSA (obstructive sleep apnea)    Paroxysmal SVT (supraventricular tachycardia) (HCC)    PVC (premature ventricular contraction)    intermittent   Syncope and collapse    last friday had alot of dizziness   Past Surgical History:  Procedure Laterality Date   ABDOMINAL HYSTERECTOMY     catheter ablation     TONSILLECTOMY     Patient Active Problem List   Diagnosis Date Noted   Acute CVA (cerebrovascular accident) ([place]) 11/17/2021   Hypertensive urgency 11/16/2021   Focal neurological deficit 11/16/2021   Paroxysmal atrial tachycardia ([place]) 06/18/2021   Chest pain of uncertain etiology 22/63/3354   Mixed hyperlipidemia 06/18/2021   TIA (transient ischemic attack) 06/12/2015   Amaurosis fugax    Atypical migraine    Amaurosis fugax of left eye 06/11/2015   Dizziness and giddiness 04/04/2013   Abnormality of gait 02/15/2013   Spinal stenosis of cervical region 02/15/2013   Palpitations 11/28/2012   History of supraventricular tachycardia 11/28/2012   Essential hypertension 11/28/2012   History of CVA (cerebrovascular accident) 11/28/2012    REFERRING DIAG:   I16.0 (ICD-10-CM) - Hypertensive urgency  I63.9 (ICD-10-CM) - Acute CVA (cerebrovascular accident) ([place])    THERAPY DIAG:  Muscle weakness (generalized)  Dizziness and giddiness  Rationale for Evaluation and Treatment Rehabilitation  PERTINENT HISTORY: HTN, hyperlipidemia, previous TIA in 2016, Mnire's disease, SVT, prediabetes, medication non-adherence.   PRECAUTIONS: fall  SUBJECTIVE: Doing well, been doing her exercises. Went to a church earlier and got a little bit of dizziness and felt weak. Felt like she was wobbling. Had to sit and it went away after an hour.   PAIN:  Are you having pain? No Reports L knee hurts when standing or walking.   Vitals: Vitals:   12/15/21 1543  BP: (!) 151/72  Pulse: 73      TODAYS TREATMENT:   Standing Balance: Rockerboard:  -In A/P direction: weight shifting x15 reps - cues to use mirror for visual input to weight shift to R as pt keeps weight shifted to L, static stance x30 seconds with cues to shift R for balance, x10 reps head turns, 2 x10 reps head nods. Pt more challenged with head nods for balance, needing intermittent taps to bars. -In M/L direction: keeping board steady in middle, working on weight shifting to R and back to midline as pt has tendency to shift weight to L x10 reps, then performed reaching outside of BOS  to R to tap cone target with 5 second holds, x8 reps   Standing Balance: Surface: Airex Position: Narrow Base of Support (slight space between feet) 3 x 30 seconds, mild postural sway with intermittent taps to bars for balance.         PATIENT EDUCATION: Education details: Gave pt handout of SOT testing and reviewed results of SOT again (per pt's request). Answered pt's questions on her current progress with therapy, purpose of HEP, areas we are working on in therapy and why we are addressing them in regards to pt's impairments  Person educated: Patient and Spouse Education method: Explanation,  Demonstration, and Handout Education comprehension: verbalized understanding.     HOME EXERCISE PROGRAM: Access Code: [email or web address] URL: [email or web address] Date: 12/01/2021 Prepared by: Estevan Ryder  Exercises - Seated Gaze Stabilization with Head Rotation  - 1 x daily - 7 x weekly - 3 sets - 10 reps - Seated Horizontal Saccades  - 1 x daily - 7 x weekly - 3 sets - 10 reps - Seated Vertical Saccades  - 1 x daily - 7 x weekly - 3 sets - 10 reps - Seated Diagonal Saccades  - 1 x daily - 7 x weekly - 3 sets - 10 reps - Standing Balance in Corner  - 1 x daily - 7 x weekly - 3 sets - 10 reps - Standing Balance in Corner with Eyes Closed  - 1 x daily - 7 x weekly - 3 sets - 10 reps - Semi-Tandem Corner Balance With Eyes Open  - 1 x daily - 7 x weekly - 3 sets - 10 reps - Corner Balance Feet Together With Eyes Open  - 1 x daily - 7 x weekly - 3 sets - 10 reps - Corner Balance Feet Together: Eyes Open With Head Turns  - 1 x daily - 7 x weekly - 3 sets - 10 reps - Corner Balance Feet Together With Eyes Closed  - 1 x daily - 7 x weekly - 3 sets - 10 reps - Semi-Tandem Corner Balance: Eyes Open With Head Turns  - 1 x daily - 7 x weekly - 3 sets - 10 reps - Corner Balance Feet Apart: Eyes Closed With Head Turns  - 1 x daily - 7 x weekly - 3 sets - 10 reps     GOALS: Goals reviewed with patient? Yes   SHORT TERM GOALS: Target date: 12/07/2021   Pt will be independent with balance/strength HEP to minimize risk for falling at home.  Baseline: established Goal status: MET   2.  Pt will improve gait speed to >/= .35  m/sec to demonstrate improved community ambulation  Baseline: .61ms; .317m 12/07/21 Goal status: MET   3.  Patient will improve FGA score to >/= 19/30 for improved dynamic and ambulatory balance Baseline: 14/30; 22/30 12/07/21 Goal status: MET       LONG TERM GOALS: Target date: 01/18/2022   Pt will improve gait speed to >/= 1 m/sec to demonstrate improved  community ambulation  Baseline: .2440mGoal status: INITIAL   2.  Patient will improve FGA score to >/= 28/30 Baseline: 14/30 Goal status: INITIAL   3.  Patient will be able to negotiate a full flight of stairs with no HR use to allow for improved access to her home environment. Baseline: 12 steps with R HR use and SBA Goal status: INITIAL   ASSESSMENT:   CLINICAL IMPRESSION:  Provided education during session by answering pt's questions in  regards to results from SOT testing during last session and the purpose of PT interventions in regards to her impairments. Worked on balance strategies with incr vestibular input with EC and rockerboard tasks with weight shifting with trying to get more weight shifted to R side as pt tends to keep majority of her weight to the L and anteriorly (as also noted by SOT testing). Will continue to progress towards LTGs.      OBJECTIVE IMPAIRMENTS Abnormal gait, decreased activity tolerance, decreased balance, decreased coordination, and dizziness.    ACTIVITY LIMITATIONS carrying, lifting, bending, sitting, standing, squatting, sleeping, stairs, transfers, bed mobility, and locomotion level   PARTICIPATION LIMITATIONS: meal prep, interpersonal relationship, driving, shopping, community activity, and yard work   PERSONAL FACTORS Age and 3+ comorbidities: HTN, Mnire's disease, hyperlipidemia,   are also affecting patient's functional outcome.    REHAB POTENTIAL: Good   CLINICAL DECISION MAKING: Evolving/moderate complexity   EVALUATION COMPLEXITY: Moderate   PLAN: PT FREQUENCY: 2x/week   PT DURATION: 8 weeks   PLANNED INTERVENTIONS: Therapeutic exercises, Therapeutic activity, Neuromuscular re-education, Balance training, Gait training, Patient/Family education, Joint mobilization, Stair training, Vestibular training, Canalith repositioning, Visual/preceptual remediation/compensation, DME instructions, Aquatic Therapy, Cryotherapy, Moist heat,  Manual therapy, and Re-evaluation   PLAN FOR NEXT SESSION: ankle/hip/stepping strategy, corner exercises, weight shifting tasks to R.    Arliss Journey, PT, DPT   12/16/2021, 7:59 AM

## 2021-12-17 ENCOUNTER — Ambulatory Visit: Payer: Medicare Other | Admitting: Occupational Therapy

## 2021-12-17 ENCOUNTER — Encounter: Payer: Self-pay | Admitting: Occupational Therapy

## 2021-12-17 ENCOUNTER — Ambulatory Visit: Payer: Medicare Other

## 2021-12-17 DIAGNOSIS — R278 Other lack of coordination: Secondary | ICD-10-CM

## 2021-12-17 DIAGNOSIS — R269 Unspecified abnormalities of gait and mobility: Secondary | ICD-10-CM | POA: Diagnosis not present

## 2021-12-17 DIAGNOSIS — R42 Dizziness and giddiness: Secondary | ICD-10-CM

## 2021-12-17 DIAGNOSIS — I69354 Hemiplegia and hemiparesis following cerebral infarction affecting left non-dominant side: Secondary | ICD-10-CM

## 2021-12-17 DIAGNOSIS — M6281 Muscle weakness (generalized): Secondary | ICD-10-CM

## 2021-12-17 NOTE — Therapy (Signed)
OUTPATIENT PHYSICAL THERAPY TREATMENT NOTE   Patient Name: KEAGAN TREZISE MRN: [id number] DOB:[birth year], 76 y.o., female Today's Date: 12/17/2021  PCP: Renford Dills, MD REFERRING PROVIDER: Renford Dills, MD  END OF SESSION:   PT End of Session - 12/17/21 1403     Visit Number 6    Number of Visits 17    Date for PT Re-Evaluation 01/18/22    Authorization Type Medicare part A& B    PT Start Time 1402    PT Stop Time 1445    PT Time Calculation (min) 43 min    Activity Tolerance Patient tolerated treatment well    Behavior During Therapy [facility] for tasks assessed/performed               Past Medical History:  Diagnosis Date   Anxiety    Arthritis    Balance problem    Depression    Double vision 10/2014   bilateral   Dyslipidemia    High blood pressure    Multiple thyroid nodules    OSA (obstructive sleep apnea)    Paroxysmal SVT (supraventricular tachycardia) (HCC)    PVC (premature ventricular contraction)    intermittent   Syncope and collapse    last friday had alot of dizziness   Past Surgical History:  Procedure Laterality Date   ABDOMINAL HYSTERECTOMY     catheter ablation     TONSILLECTOMY     Patient Active Problem List   Diagnosis Date Noted   Acute CVA (cerebrovascular accident) (HCC) 11/17/2021   Hypertensive urgency 11/16/2021   Focal neurological deficit 11/16/2021   Paroxysmal atrial tachycardia (HCC) 06/18/2021   Chest pain of uncertain etiology 06/18/2021   Mixed hyperlipidemia 06/18/2021   TIA (transient ischemic attack) 06/12/2015   Amaurosis fugax    Atypical migraine    Amaurosis fugax of left eye 06/11/2015   Dizziness and giddiness 04/04/2013   Abnormality of gait 02/15/2013   Spinal stenosis of cervical region 02/15/2013   Palpitations 11/28/2012   History of supraventricular tachycardia 11/28/2012   Essential hypertension 11/28/2012   History of CVA (cerebrovascular accident) 11/28/2012    REFERRING DIAG:  I16.0  (ICD-10-CM) - Hypertensive urgency  I63.9 (ICD-10-CM) - Acute CVA (cerebrovascular accident) (HCC)    THERAPY DIAG:  Dizziness and giddiness  Muscle weakness (generalized)  Rationale for Evaluation and Treatment Rehabilitation  PERTINENT HISTORY: HTN, hyperlipidemia, previous TIA in 2016, Mnire's disease, SVT, prediabetes, medication non-adherence.   PRECAUTIONS: fall  SUBJECTIVE: Patient reports doing well. She denies falls/near falls.    PAIN:  Are you having pain? No Reports L knee hurts when standing or walking.   Vitals: There were no vitals filed for this visit.     TODAYS TREATMENT: NMR: -NuStep x8 mins level 5 B UE/LE  -lunge onto Bosu ball B LE 2x10  -lateral lunge onto Bosu ball B LE 1x10 -seated hamstring stretch 3x30s B LE -standing on ramp upwards (EO/EC, head turns) -standing on ramp upwards with foam (EO/EC, head turns)  -standing on ramp downwards ball toss-> dual cog task (patient prioritizing motor task)     PATIENT EDUCATION: Education details: HEP, balance progressions Person educated: Patient and Spouse Education method: Explanation, Demonstration, and Handout Education comprehension: verbalized understanding.     HOME EXERCISE PROGRAM: Access Code: [email or web address] URL: [email or web address] Date: 12/01/2021 Prepared by: Merry Lofty  Exercises - Seated Gaze Stabilization with Head Rotation  - 1 x daily - 7 x weekly - 3 sets - 10 reps - Seated Horizontal Saccades  -  1 x daily - 7 x weekly - 3 sets - 10 reps - Seated Vertical Saccades  - 1 x daily - 7 x weekly - 3 sets - 10 reps - Seated Diagonal Saccades  - 1 x daily - 7 x weekly - 3 sets - 10 reps - Standing Balance in Corner  - 1 x daily - 7 x weekly - 3 sets - 10 reps - Standing Balance in Corner with Eyes Closed  - 1 x daily - 7 x weekly - 3 sets - 10 reps - Semi-Tandem Corner Balance With Eyes Open  - 1 x daily - 7 x weekly - 3 sets - 10 reps - Corner Balance Feet  Together With Eyes Open  - 1 x daily - 7 x weekly - 3 sets - 10 reps - Corner Balance Feet Together: Eyes Open With Head Turns  - 1 x daily - 7 x weekly - 3 sets - 10 reps - Corner Balance Feet Together With Eyes Closed  - 1 x daily - 7 x weekly - 3 sets - 10 reps - Semi-Tandem Corner Balance: Eyes Open With Head Turns  - 1 x daily - 7 x weekly - 3 sets - 10 reps - Corner Balance Feet Apart: Eyes Closed With Head Turns  - 1 x daily - 7 x weekly - 3 sets - 10 reps - Lunge Onto BOSU Ball  - 1 x daily - 7 x weekly - 3 sets - 10 reps - Seated Hamstring Stretch  - 1 x daily - 7 x weekly - 3 sets - 10 reps     GOALS: Goals reviewed with patient? Yes   SHORT TERM GOALS: Target date: 12/07/2021   Pt will be independent with balance/strength HEP to minimize risk for falling at home.  Baseline: established Goal status: MET   2.  Pt will improve gait speed to >/= .35  m/sec to demonstrate improved community ambulation  Baseline: .28m/s; .55m/s 12/07/21 Goal status: MET   3.  Patient will improve FGA score to >/= 19/30 for improved dynamic and ambulatory balance Baseline: 14/30; 22/30 12/07/21 Goal status: MET       LONG TERM GOALS: Target date: 01/18/2022   Pt will improve gait speed to >/= 1 m/sec to demonstrate improved community ambulation  Baseline: .8m/s Goal status: INITIAL   2.  Patient will improve FGA score to >/= 28/30 Baseline: 14/30 Goal status: INITIAL   3.  Patient will be able to negotiate a full flight of stairs with no HR use to allow for improved access to her home environment. Baseline: 12 steps with R HR use and SBA Goal status: INITIAL   ASSESSMENT:   CLINICAL IMPRESSION:  Patient seen for skilled PT session with emphasis on balance re-training. PT adding to HEP, see above. Patient progressing well with balance tasks, but remains with difficulty dual tasking. Extensive conversation regarding patients perceived difficulty with higher level cog and sequencing.  Continue to progress ambulatory balance and dual tasking as able.    OBJECTIVE IMPAIRMENTS Abnormal gait, decreased activity tolerance, decreased balance, decreased coordination, and dizziness.    ACTIVITY LIMITATIONS carrying, lifting, bending, sitting, standing, squatting, sleeping, stairs, transfers, bed mobility, and locomotion level   PARTICIPATION LIMITATIONS: meal prep, interpersonal relationship, driving, shopping, community activity, and yard work   PERSONAL FACTORS Age and 3+ comorbidities: HTN, Mnire's disease, hyperlipidemia,   are also affecting patient's functional outcome.    REHAB POTENTIAL: Good   CLINICAL DECISION MAKING:  Evolving/moderate complexity   EVALUATION COMPLEXITY: Moderate   PLAN: PT FREQUENCY: 2x/week   PT DURATION: 8 weeks   PLANNED INTERVENTIONS: Therapeutic exercises, Therapeutic activity, Neuromuscular re-education, Balance training, Gait training, Patient/Family education, Joint mobilization, Stair training, Vestibular training, Canalith repositioning, Visual/preceptual remediation/compensation, DME instructions, Aquatic Therapy, Cryotherapy, Moist heat, Manual therapy, and Re-evaluation   PLAN FOR NEXT SESSION: ankle/hip/stepping strategy, corner exercises, weight shifting tasks to R, dual tasking   Westley Foots, PT, DPT  Westley Foots, PT, DPT, CBIS   12/17/2021, 3:03 PM

## 2021-12-22 ENCOUNTER — Encounter: Payer: Medicare Other | Admitting: Occupational Therapy

## 2021-12-22 ENCOUNTER — Ambulatory Visit: Payer: Medicare Other

## 2021-12-23 ENCOUNTER — Encounter: Payer: Self-pay | Admitting: Nurse Practitioner

## 2021-12-23 ENCOUNTER — Ambulatory Visit (INDEPENDENT_AMBULATORY_CARE_PROVIDER_SITE_OTHER): Payer: Medicare Other | Admitting: Nurse Practitioner

## 2021-12-23 VITALS — BP 138/74 | HR 74 | Ht 60.0 in | Wt 143.6 lb

## 2021-12-23 DIAGNOSIS — G459 Transient cerebral ischemic attack, unspecified: Secondary | ICD-10-CM | POA: Diagnosis not present

## 2021-12-23 DIAGNOSIS — I10 Essential (primary) hypertension: Secondary | ICD-10-CM

## 2021-12-23 DIAGNOSIS — I4719 Other supraventricular tachycardia: Secondary | ICD-10-CM

## 2021-12-23 DIAGNOSIS — Z8673 Personal history of transient ischemic attack (TIA), and cerebral infarction without residual deficits: Secondary | ICD-10-CM | POA: Diagnosis not present

## 2021-12-23 DIAGNOSIS — I471 Supraventricular tachycardia: Secondary | ICD-10-CM

## 2021-12-23 DIAGNOSIS — E785 Hyperlipidemia, unspecified: Secondary | ICD-10-CM

## 2021-12-23 NOTE — Patient Instructions (Signed)
Medication Instructions:   Your physician recommends that you continue on your current medications as directed. Please refer to the Current Medication list given to you today.    *If you need a refill on your cardiac medications before your next appointment, please call your pharmacy*   Lab Work:  Your physician recommends that you return for a FASTING lipid profile/Wednesday, July 26. You can come in on the day of your appointment anytime between 7:30-4:30 fasting from midnight the night before.    If you have labs (blood work) drawn today and your tests are completely normal, you will receive your results only by: MyChart Message (if you have MyChart) OR A paper copy in the mail If you have any lab test that is abnormal or we need to change your treatment, we will call you to review the results.   Testing/Procedures:  SELF PAY CALCIUM SCORING AT [place] Non-Cardiac CT scanning, (CAT scanning), is a noninvasive, special x-ray that produces cross-sectional images of the body using x-rays and a computer. CT scans help physicians diagnose and treat medical condition. CT scans provide greater clarity and reveal more details than regular x-ray exams.    Follow-Up: At [facility], you and your health needs are our priority.  As part of our continuing mission to provide you with exceptional heart care, we have created designated Provider Care Teams.  These Care Teams include your primary Cardiologist (physician) and Advanced Practice Providers (APPs -  Physician Assistants and Nurse Practitioners) who all work together to provide you with the care you need, when you need it.  We recommend signing up for the patient portal called "MyChart".  Sign up information is provided on this After Visit Summary.  MyChart is used to connect with patients for Virtual Visits (Telemedicine).  Patients are able to view lab/test results, encounter notes, upcoming appointments, etc.  Non-urgent messages  can be sent to your provider as well.   To learn more about what you can do with MyChart, go to [email or web address].    Your next appointment:   3 month(s)  The format for your next appointment:   In Person  Provider:   Eligha Bridegroom, NP         Other Instructions   HOW TO TAKE YOUR BLOOD PRESSURE: Rest 5 minutes before taking your blood pressure.  Don't smoke or drink caffeinated beverages for at least 30 minutes before. Take your blood pressure before (not after) you eat. Sit comfortably with your back supported and both feet on the floor (don't cross your legs). Elevate your arm to heart level on a table or a desk. Use the proper sized cuff. It should fit smoothly and snugly around your bare upper arm. There should be enough room to slip a fingertip under the cuff. The bottom edge of the cuff should be 1 inch above the crease of the elbow.  Please send in BP readings either call @ [phone] or send mychart message in one week.  Please Drink plenty of water 48 oz per day.  Important Information About Sugar

## 2021-12-23 NOTE — Progress Notes (Signed)
Cardiology Office Note:    Date:  12/23/2021   ID:  MAGUIRE KILLMER, DOB 02/18/1946, MRN [id number]  PCP:  Renford Dills, MD   [facility] Providers Cardiologist:  Donato Schultz, MD     Referring MD: Renford Dills, MD   Chief Complaint: follow-up recent CVA  History of Present Illness:    [name] is a pleasant 76 y.o. female with a hx of SVT, TIA, OSA on CPAP, former tobacco abuse  Previously seen and 2016 by Dr. Antoine Poche for evaluation of chest discomfort and palpitations, history of SVT with an apparently failed ablation at [facility] clinic years prior (around 1999). She reestablished care on 06/18/2021 with Dr. Anne Fu for evaluation of chest burning, SVT. She reported 2 times a month she would feel her heart racing, this was unprovoked.  Symptoms occur most often when lying, feels a burning sensation that radiates up chest wall to throat.  Feels lightheaded at times.  History of Mnire's disease, no attack x5 years.  Prior event monitor in 2014 showed short episodes of PAT lasting up to 10 beats duration, 136 bpm. She was initially advised to undergo CTA for evaluation of coronary anatomy but she called back with concerns for receiving CT contrast due to allergy. Dr. Anne Fu advised that she undergo CT for calcium scoring and Lexiscan Myoview. Patient was to call back to schedule these.  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  Discharged on aspirin/Plavix x3 weeks, followed by aspirin alone. Outpatient cardiac monitor recommended.    Today, she is here with her husband for posthospital follow-up.  She reports she is doing well, has some days where she has more energy than others. Rare episodes of palpitations/heart racing. If it is really fast, she may feel a little "woozy." Home SP ranging from 130s - 160s with diastolic  readings mostly < 80. Ankle swelling since CVA, left worse than right. She denies chest pain, shortness of breath, lower extremity edema, melena, hematuria, hemoptysis, diaphoresis, weakness, presyncope, syncope, orthopnea, and PND. Admits that she does not drink enough water.   Past Medical History:  Diagnosis Date   Anxiety    Arthritis    Balance problem    Depression    Double vision 10/2014   bilateral   Dyslipidemia    High blood pressure    Multiple thyroid nodules    OSA (obstructive sleep apnea)    Paroxysmal SVT (supraventricular tachycardia) (HCC)    PVC (premature ventricular contraction)    intermittent   Syncope and collapse    last friday had alot of dizziness    Past Surgical History:  Procedure Laterality Date   ABDOMINAL HYSTERECTOMY     catheter ablation     TONSILLECTOMY      Current Medications: Current Meds  Medication Sig   amLODipine (NORVASC) 5 MG tablet Take 1 tablet (5 mg total) by mouth daily.   aspirin EC 81 MG tablet Take 1 tablet (81 mg total) by mouth daily. Swallow whole.   ondansetron (ZOFRAN-ODT) 4 MG disintegrating tablet Take 1 tablet (4 mg total) by mouth every 8 (eight) hours as needed for nausea or vomiting.   rosuvastatin (CRESTOR) 40 MG tablet Take 1 tablet (40 mg total) by mouth daily.     Allergies:   Epinephrine, Contrast media [iodinated contrast media], and Keflex [cephalexin]   Social History  Socioeconomic History   Marital status: Married    Spouse name: Antony Haste   Number of children: 0   Years of education: 12   Highest education level: Not on file  Occupational History    Employer: RETIRED    Comment: retired  Tobacco Use   Smoking status: Former    Packs/day: 0.00    Years: 20.00    Total pack years: 0.00    Types: Cigarettes    Quit date: 12/11/1984    Years since quitting: 37.0   Smokeless tobacco: Never   Tobacco comments:    Quit 40 years ago  Vaping Use   Vaping Use: Never used  Substance and Sexual  Activity   Alcohol use: No   Drug use: No   Sexual activity: Yes    Partners: Male    Comment: married- 28 yrs   Other Topics Concern   Not on file  Social History Narrative   Patient lives at home with her husband Antony Haste). Patient is retired.    Social Determinants of Health   Financial Resource Strain: Not on file  Food Insecurity: Not on file  Transportation Needs: Not on file  Physical Activity: Not on file  Stress: Not on file  Social Connections: Not on file     Family History: The patient's family history includes Arthritis in her mother; CAD (age of onset: 50) in her father; High blood pressure in her father; Neuropathy in her mother; Stroke (age of onset: 54) in her father.  ROS:   Please see the history of present illness.    + fatigue All other systems reviewed and are negative.  Labs/Other Studies Reviewed:    The following studies were reviewed today:  Echo 11/17/21  1. Left ventricular ejection fraction, by estimation, is 65 to 70%. The  left ventricle has hyperdynamic function. The left ventricle has no  regional wall motion abnormalities. There is moderate left ventricular  hypertrophy. Left ventricular diastolic  parameters are consistent with Grade I diastolic dysfunction (impaired  relaxation).   2. Right ventricular systolic function is normal. The right ventricular  size is normal. Tricuspid regurgitation signal is inadequate for assessing  PA pressure.   3. The mitral valve is normal in structure. No evidence of mitral valve  regurgitation. No evidence of mitral stenosis.   4. The aortic valve is tricuspid. Aortic valve regurgitation is mild. No  aortic stenosis is present.   5. The inferior vena cava is normal in size with <50% respiratory  variability, suggesting right atrial pressure of 8 mmHg.    Echo 05/2015  - Left ventricle: The cavity size was normal. Wall thickness was    normal. Systolic function was vigorous. The estimated ejection     fraction was in the range of 65% to 70%. Doppler parameters are    consistent with abnormal left ventricular relaxation (grade 1    diastolic dysfunction).  - Aortic valve: There was trivial regurgitation.   Recent Labs: 11/17/2021: ALT 18; BUN 12; Creatinine, Ser 0.97; Hemoglobin 13.0; Platelets 220; Potassium 3.7; Sodium 138  Recent Lipid Panel    Component Value Date/Time   CHOL 231 (H) 11/17/2021 0132   TRIG 127 11/17/2021 0132   HDL 52 11/17/2021 0132   CHOLHDL 4.4 11/17/2021 0132   VLDL 25 11/17/2021 0132   LDLCALC 154 (H) 11/17/2021 0132     Risk Assessment/Calculations:       Physical Exam:    VS:  BP 138/74 (BP Location: Right Arm,  Patient Position: Sitting, Cuff Size: Normal)   Pulse 74   Ht 5' (1.524 m)   Wt 143 lb 9.6 oz (65.1 kg)   SpO2 95%   BMI 28.04 kg/m     Wt Readings from Last 3 Encounters:  12/23/21 143 lb 9.6 oz (65.1 kg)  11/16/21 145 lb (65.8 kg)  06/23/21 142 lb (64.4 kg)     GEN:  Well nourished, well developed in no acute distress HEENT: Normal NECK: No JVD; No carotid bruits CARDIAC: RRR, no murmurs, rubs, gallops RESPIRATORY:  Clear to auscultation without rales, wheezing or rhonchi  ABDOMEN: Soft, non-tender, non-distended MUSCULOSKELETAL:  Mild ankle edema LLE; No deformity. 2+ pedal pulses, equal bilaterally SKIN: Warm and dry NEUROLOGIC:  Alert and oriented x 3 PSYCHIATRIC:  Normal affect   EKG:  EKG is not ordered today.    Diagnoses:    1. TIA (transient ischemic attack)   2. Paroxysmal atrial tachycardia ([place])   3. History of stroke   4. Hyperlipidemia LDL goal <70   5. Essential hypertension    Assessment and Plan:     Paroxysmal atrial tachycardia: Rare episodes of palpitations/fast HR.  Recent cardiac monitor by PCP revealed occasional episodes of SVT longest of which was 8.4 seconds at 146 bpm, no atrial fibrillation. Previously did not tolerate Inderal.  Advised her to notify us if symptoms worsen.   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  Also with hypertensive urgency. Recovering well with only minor fatigue. Completed 3 week course of DAPT. Now just on aspirin. Management per neurology.   Hypertension: BP is well-controlled today. Hospital d/c states initially permissive hypertension, now to optimize management. Generally gets higher readings at home.  We will have her monitor for 1 week and report back to [place].  We discussed treatment options in addition to amlodipine and she would like to try spironolactone. If BP remains > 130/80, we will start spironolactone 25 mg daily and check bmet 7-10 days after.   Hyperlipidemia LDL goal < 70: LDL 154 on 11/17/2021.  She is tolerating rosuvastatin 40 mg.  We will recheck in 4 weeks.  Cardiovascular risk reduction: She asked about testing for coronary artery disease.  She would like to proceed with coronary calcium score.  Encouraged heart healthy, mostly plant-based diet, good hydration with water, regular physical activity, and low LDL cholesterol.     Disposition: 3 months with APP  Medication Adjustments/Labs and Tests Ordered: Current medicines are reviewed at length with the patient today.  Concerns regarding medicines are outlined above.  Orders Placed This Encounter  Procedures   CT CARDIAC SCORING (SELF PAY ONLY)   Lipid Profile   No orders of the defined types were placed in this encounter.   Patient Instructions  Medication Instructions:   Your physician recommends that you continue on your current medications as directed. Please refer to the Current Medication list given to you today.    *If you need a refill on your cardiac medications before your next appointment, please call your pharmacy*   Lab Work:  Your physician recommends that you return for a FASTING lipid profile/Wednesday, July 26. You can come in on the day of your appointment anytime between  7:30-4:30 fasting from midnight the night before.    If you have labs (blood work) drawn today and your tests are completely normal, you will receive your results only by: [place] (if you have MyChart) OR  A paper copy in the mail If you have any lab test that is abnormal or we need to change your treatment, we will call you to review the results.   Testing/Procedures:  SELF PAY CALCIUM SCORING AT [place]BRID[place] Non-Cardiac CT scanning, (CAT scanning), is a noninvasive, special x-ray that produces cross-sectional images of the body using x-rays and a computer. CT scans help physicians diagnose and treat medical condition. CT scans provide greater clarity and reveal more details than regular x-ray exams.    Follow-Up: At [facility], you and your health needs are our priority.  As part of our continuing mission to provide you with exceptional heart care, we have created designated Provider Care Teams.  These Care Teams include your primary Cardiologist (physician) and Advanced Practice Providers (APPs -  Physician Assistants and Nurse Practitioners) who all work together to provide you with the care you need, when you need it.  We recommend signing up for the patient portal called "MyChart".  Sign up information is provided on this After Visit Summary.  MyChart is used to connect with patients for Virtual Visits (Telemedicine).  Patients are able to view lab/test results, encounter notes, upcoming appointments, etc.  Non-urgent messages can be sent to your provider as well.   To learn more about what you can do with MyChart, go to [email or web address].    Your next appointment:   3 month(s)  The format for your next appointment:   In Person  Provider:   Eligha Bridegroom, NP         Other Instructions   HOW TO TAKE YOUR BLOOD PRESSURE: Rest 5 minutes before taking your blood pressure.  Don't smoke or drink caffeinated beverages for at least 30 minutes before. Take your  blood pressure before (not after) you eat. Sit comfortably with your back supported and both feet on the floor (don't cross your legs). Elevate your arm to heart level on a table or a desk. Use the proper sized cuff. It should fit smoothly and snugly around your bare upper arm. There should be enough room to slip a fingertip under the cuff. The bottom edge of the cuff should be 1 inch above the crease of the elbow.  Please send in BP readings either call @ [phone] or send mychart message in one week.  Please Drink plenty of water 48 oz per day.  Important Information About Sugar         Signed, Levi Aland, NP  12/23/2021 2:20 PM    [facility]

## 2021-12-24 ENCOUNTER — Ambulatory Visit: Payer: Medicare Other | Admitting: Occupational Therapy

## 2021-12-24 ENCOUNTER — Ambulatory Visit: Payer: Medicare Other | Admitting: Physical Therapy

## 2021-12-24 ENCOUNTER — Encounter: Payer: Self-pay | Admitting: Physical Therapy

## 2021-12-24 ENCOUNTER — Encounter: Payer: Self-pay | Admitting: Occupational Therapy

## 2021-12-24 DIAGNOSIS — R278 Other lack of coordination: Secondary | ICD-10-CM

## 2021-12-24 DIAGNOSIS — I69354 Hemiplegia and hemiparesis following cerebral infarction affecting left non-dominant side: Secondary | ICD-10-CM

## 2021-12-24 DIAGNOSIS — R269 Unspecified abnormalities of gait and mobility: Secondary | ICD-10-CM | POA: Diagnosis not present

## 2021-12-24 DIAGNOSIS — R42 Dizziness and giddiness: Secondary | ICD-10-CM

## 2021-12-24 DIAGNOSIS — M6281 Muscle weakness (generalized): Secondary | ICD-10-CM

## 2021-12-24 NOTE — Therapy (Signed)
OUTPATIENT PHYSICAL THERAPY TREATMENT NOTE   Patient Name: [name] MRN: [id number] DOB:02/07/1946, 76 y.o., female Today's Date: 12/24/2021  PCP: Seward Carol, MD REFERRING PROVIDER: Seward Carol, MD  END OF SESSION:   PT End of Session - 12/24/21 1450     Visit Number 7    Number of Visits 17    Date for PT Re-Evaluation 01/18/22    Authorization Type Medicare part A& B    PT Start Time 1448    PT Stop Time 1528    PT Time Calculation (min) 40 min    Activity Tolerance Patient tolerated treatment well    Behavior During Therapy [facility] for tasks assessed/performed               Past Medical History:  Diagnosis Date   Anxiety    Arthritis    Balance problem    Depression    Double vision 10/2014   bilateral   Dyslipidemia    High blood pressure    Multiple thyroid nodules    OSA (obstructive sleep apnea)    Paroxysmal SVT (supraventricular tachycardia) (HCC)    PVC (premature ventricular contraction)    intermittent   Syncope and collapse    last friday had alot of dizziness   Past Surgical History:  Procedure Laterality Date   ABDOMINAL HYSTERECTOMY     catheter ablation     TONSILLECTOMY     Patient Active Problem List   Diagnosis Date Noted   Acute CVA (cerebrovascular accident) ([place]) 11/17/2021   Hypertensive urgency 11/16/2021   Focal neurological deficit 11/16/2021   Paroxysmal atrial tachycardia ([place]) 06/18/2021   Chest pain of uncertain etiology 40/98/1191   Mixed hyperlipidemia 06/18/2021   TIA (transient ischemic attack) 06/12/2015   Amaurosis fugax    Atypical migraine    Amaurosis fugax of left eye 06/11/2015   Dizziness and giddiness 04/04/2013   Abnormality of gait 02/15/2013   Spinal stenosis of cervical region 02/15/2013   Palpitations 11/28/2012   History of supraventricular tachycardia 11/28/2012   Essential hypertension 11/28/2012   History of CVA (cerebrovascular accident) 11/28/2012    REFERRING DIAG:  I16.0  (ICD-10-CM) - Hypertensive urgency  I63.9 (ICD-10-CM) - Acute CVA (cerebrovascular accident) ([place])    THERAPY DIAG:  Muscle weakness (generalized)  Other lack of coordination  Hemiplegia and hemiparesis following cerebral infarction affecting left non-dominant side (HCC)  Dizziness and giddiness  Rationale for Evaluation and Treatment Rehabilitation  PERTINENT HISTORY: HTN, hyperlipidemia, previous TIA in 2016, Mnire's disease, SVT, prediabetes, medication non-adherence.   PRECAUTIONS: fall  SUBJECTIVE: No changes since she was last here. Has good days and bad days. Pt's husband reports she is not leaning to the left as much.   PAIN:  Are you having pain? No Reports L knee hurts when standing or walking.   Vitals: There were no vitals filed for this visit.     TODAYS TREATMENT: NMR: -115' tossing ball and catching it - cued to track it with head/eyes, then continued to perform an additional 2 laps for cog dual tasking with naming animals in [place] x1 lap and animals under the ocean x1 lap, pt not tossing ball as much or as high as she ws before.   -In front of rebounder: on air ex with feet hip width tossing ball and catching and then adding in cognitive task with counting backwards, then performed SLS x10 reps each leg and tossing/catching ball, pt only able to hold for a few seconds at a  time.  -On air ex: slow marching x10 reps each leg, min guard/min A at times for balance -Tandem gait; down and back x2 reps on blue foam beam, down and back x2 reps on level ground with focus on going slowly and pt holding each tandem stance position for 5 seconds each -On blue air ex; alternating forward stepping strategy x15 reps each leg with head turn to R/L, pt needing intermittent UE support for balance.    Standing Balance: Surface: Airex Position: Feet Hip Width Apart Completed with: Eyes Closed; Head Turns x 2 sets of 10 Reps and Head Nods x 2 sets of  Reps  Cued for slowed  pace when performing head motions.         PATIENT EDUCATION: Education details: Continue with HEP - standing marching added on pillow for home (per pt request) Person educated: Patient and Spouse Education method: Explanation, Demonstration, and Handout Education comprehension: verbalized understanding.     HOME EXERCISE PROGRAM: Access Code: [email or web address] URL: [email or web address] Date: 12/24/2021 Prepared by: Janann August  Exercises - Seated Gaze Stabilization with Head Rotation  - 1 x daily - 7 x weekly - 3 sets - 10 reps - Seated Horizontal Saccades  - 1 x daily - 7 x weekly - 3 sets - 10 reps - Seated Vertical Saccades  - 1 x daily - 7 x weekly - 3 sets - 10 reps - Seated Diagonal Saccades  - 1 x daily - 7 x weekly - 3 sets - 10 reps - Standing Balance in Corner  - 1 x daily - 7 x weekly - 3 sets - 10 reps - Standing Balance in Corner with Eyes Closed  - 1 x daily - 7 x weekly - 3 sets - 10 reps - Semi-Tandem Corner Balance With Eyes Open  - 1 x daily - 7 x weekly - 3 sets - 10 reps - Corner Balance Feet Together With Eyes Open  - 1 x daily - 7 x weekly - 3 sets - 10 reps - Corner Balance Feet Together: Eyes Open With Head Turns  - 1 x daily - 7 x weekly - 3 sets - 10 reps - Corner Balance Feet Together With Eyes Closed  - 1 x daily - 7 x weekly - 3 sets - 10 reps - Semi-Tandem Corner Balance: Eyes Open With Head Turns  - 1 x daily - 7 x weekly - 3 sets - 10 reps - Corner Balance Feet Apart: Eyes Closed With Head Turns  - 1 x daily - 7 x weekly - 3 sets - 10 reps - Lunge Onto BOSU Ball  - 1 x daily - 7 x weekly - 3 sets - 10 reps - Seated Hamstring Stretch  - 1 x daily - 7 x weekly - 3 sets - 10 reps - Standing Marching  - 1 x daily - 5 x weekly - 1-2 sets - 10 reps   GOALS: Goals reviewed with patient? Yes   SHORT TERM GOALS: Target date: 12/07/2021   Pt will be independent with balance/strength HEP to minimize risk for falling at home.  Baseline:  established Goal status: MET   2.  Pt will improve gait speed to >/= .35  m/sec to demonstrate improved community ambulation  Baseline: .62ms; .348m 12/07/21 Goal status: MET   3.  Patient will improve FGA score to >/= 19/30 for improved dynamic and ambulatory balance Baseline: 14/30; 22/30 12/07/21 Goal status: MET       LONG  TERM GOALS: Target date: 01/18/2022   Pt will improve gait speed to >/= 1 m/sec to demonstrate improved community ambulation  Baseline: .66ms Goal status: INITIAL   2.  Patient will improve FGA score to >/= 28/30 Baseline: 14/30 Goal status: INITIAL   3.  Patient will be able to negotiate a full flight of stairs with no HR use to allow for improved access to her home environment. Baseline: 12 steps with R HR use and SBA Goal status: INITIAL   ASSESSMENT:   CLINICAL IMPRESSION:  Today's skilled session continued to focus on balance strategies with incr vestibular input, narrow BOS, compliant surfaces, and cognitive dual tasking. Pt continues to have difficulty with cognitive dual tasking when added to a motor/balance task. Will continue to progress towards LTGs.     OBJECTIVE IMPAIRMENTS Abnormal gait, decreased activity tolerance, decreased balance, decreased coordination, and dizziness.    ACTIVITY LIMITATIONS carrying, lifting, bending, sitting, standing, squatting, sleeping, stairs, transfers, bed mobility, and locomotion level   PARTICIPATION LIMITATIONS: meal prep, interpersonal relationship, driving, shopping, community activity, and yard work   PERSONAL FACTORS Age and 3+ comorbidities: HTN, Mnire's disease, hyperlipidemia,   are also affecting patient's functional outcome.    REHAB POTENTIAL: Good   CLINICAL DECISION MAKING: Evolving/moderate complexity   EVALUATION COMPLEXITY: Moderate   PLAN: PT FREQUENCY: 2x/week   PT DURATION: 8 weeks   PLANNED INTERVENTIONS: Therapeutic exercises, Therapeutic activity, Neuromuscular  re-education, Balance training, Gait training, Patient/Family education, Joint mobilization, Stair training, Vestibular training, Canalith repositioning, Visual/preceptual remediation/compensation, DME instructions, Aquatic Therapy, Cryotherapy, Moist heat, Manual therapy, and Re-evaluation   PLAN FOR NEXT SESSION: ankle/hip/stepping strategy, corner exercises, weight shifting tasks to R, dual tasking. Pt to look at exercises at home that are getting easy and to progress them as needed.   CArliss Journey PT, DPT  12/24/2021, 3:42 PM

## 2021-12-24 NOTE — Therapy (Signed)
OUTPATIENT OCCUPATIONAL THERAPY TREATMENT NOTE   Patient Name: [name] MRN: [id number] DOB:January 09, 1946, 76 y.o., female Today's Date: 12/24/2021  PCP: Seward Carol, MD REFERRING PROVIDER: Jonetta Osgood, MD   END OF SESSION:   OT End of Session - 12/24/21 1534     Visit Number 7    Number of Visits 17    Date for OT Re-Evaluation 02/02/22    Authorization Type Medicare A&B/Tricare    Progress Note Due on Visit 10    OT Start Time 1533    OT Stop Time 1611    OT Time Calculation (min) 38 min    Activity Tolerance Patient tolerated treatment well    Behavior During Therapy [facility] for tasks assessed/performed             Past Medical History:  Diagnosis Date   Anxiety    Arthritis    Balance problem    Depression    Double vision 10/2014   bilateral   Dyslipidemia    High blood pressure    Multiple thyroid nodules    OSA (obstructive sleep apnea)    Paroxysmal SVT (supraventricular tachycardia) (HCC)    PVC (premature ventricular contraction)    intermittent   Syncope and collapse    last friday had alot of dizziness   Past Surgical History:  Procedure Laterality Date   ABDOMINAL HYSTERECTOMY     catheter ablation     TONSILLECTOMY     Patient Active Problem List   Diagnosis Date Noted   Acute CVA (cerebrovascular accident) ([place]) 11/17/2021   Hypertensive urgency 11/16/2021   Focal neurological deficit 11/16/2021   Paroxysmal atrial tachycardia ([place]) 06/18/2021   Chest pain of uncertain etiology 76/16/0737   Mixed hyperlipidemia 06/18/2021   TIA (transient ischemic attack) 06/12/2015   Amaurosis fugax    Atypical migraine    Amaurosis fugax of left eye 06/11/2015   Dizziness and giddiness 04/04/2013   Abnormality of gait 02/15/2013   Spinal stenosis of cervical region 02/15/2013   Palpitations 11/28/2012   History of supraventricular tachycardia 11/28/2012   Essential hypertension 11/28/2012   History of CVA (cerebrovascular accident)  11/28/2012    ONSET DATE: 11/16/21   REFERRING DIAG:  I16.0 (ICD-10-CM) - Hypertensive urgency  R29.818 (ICD-10-CM) - Focal neurological deficit  Z86.73 (ICD-10-CM) - History of CVA (cerebrovascular accident)    THERAPY DIAG:  Muscle weakness (generalized)  Other lack of coordination  Hemiplegia and hemiparesis following cerebral infarction affecting left non-dominant side ([place])  Rationale for Evaluation and Treatment Rehabilitation  PERTINENT HISTORY: hypertension, not on any medications currently, hyperlipidemia, vertigo, patient with previous history of TIA in 2016, with her MRA head significant for few areas of focal stenosis, no acute findings however CT significant for chronic left lacunar cerebral infarct, blood pressure was elevated 215/115  PRECAUTIONS: Fall, heart monitor  SUBJECTIVE: "YOU DOING ANYTHING FOR THE WEEKEND?"  PAIN: Are you having pain? Yes NPRS scale: 1/10 Pain location: knee Pain orientation: Left      TODAY'S TREATMENT: 12/24/21   Access Code: [email or web address] URL: [email or web address] Date: 12/24/2021 Prepared by: Waldo Laine  Exercises - Seated Shoulder Row with Resistance Anchored at Feet  - 1 x daily - 7 x weekly - 3 sets - 10 reps - Seated Shoulder Horizontal Abduction with Resistance  - 1 x daily - 7 x weekly - 3 sets - 10 reps - Seated Shoulder Front Raise with Resistance  - 1 x daily - 7 x weekly - 3 sets -  10 reps - Seated Shoulder Extension with Resistance  - 1 x daily - 7 x weekly - 3 sets - 10 reps - Standing Single Arm Elbow Flexion with Resistance  - 1 x daily - 7 x weekly - 3 sets - 10 reps  Constant Therapy Alternating Symbols level 7 with 92% accuracy and 92.95s response time.   [place] Pegs with LUE with min difficulty and good speed without use of tweezers and not used in formal assessment.  HOME EXERCISE PROGRAM: 12/01/21 Coordination HEP - (updated for more challenge on 12/09/21)  12/09/21: memory  strategies, keeping thinking skills sharp 12/24/21 Yellow Theraband LUE for proximal strengthening   GOALS: Goals reviewed with patient? No   SHORT TERM GOALS: Target date: 12/22/2021   Pt will be independent with HEP targeting LUE coordination and proximal strength  Baseline: Goal status: MET   2.  Pt will verbalize understanding of adapted strategies and/or equipment PRN to increase safety and independence with ADLs and IADLs (I.e. home management tasks, bathing etc)  Baseline:  Goal status: MET     LONG TERM GOALS: Target date: 02/02/2022   Pt will be independent with updated HEPs. Baseline:  Goal status: IN PROGRESS   2.  Pt will complete all basic ADLs with modified independence and good safety awareness. Baseline:  Goal status: MET  3.  Pt will increase fine motor coordination in LUE  by improving 9 hole peg test by at least 5 seconds   Baseline: Right: 25.25 sec; Left: 31.33 sec Goal status: IN PROGRESS LUE 39.4s   4.  Pt will increase strength in LUE shoulder flexion evidenced by ability to reach and grab an item weight at least 3 lbs from mid/high level shelf. Baseline:  Goal status: IN PROGRESS   5.  Pt will increase ability to complete two tasks simultaneously by completing dual tasks and/or alternating attention tasks with 90% accuracy or greater. Baseline: diff with trail making b Goal status: IN PROGRESS   ASSESSMENT:   CLINICAL IMPRESSION: Pt continues to progress towards goals. Pt has met all STGs and is progressing towards LTGs.   PERFORMANCE DEFICITS in functional skills including ADLs, IADLs, coordination, dexterity, ROM, FMC, GMC, balance, decreased knowledge of use of DME, and UE functional use, cognitive skills including attention, safety awareness, and understand   IMPAIRMENTS are limiting patient from ADLs, IADLs, and leisure.    COMORBIDITIES may have co-morbidities  that affects occupational performance. Patient will benefit from skilled OT to  address above impairments and improve overall function.   MODIFICATION OR ASSISTANCE TO COMPLETE EVALUATION: Min-Moderate modification of tasks or assist with assess necessary to complete an evaluation.   OT OCCUPATIONAL PROFILE AND HISTORY: Detailed assessment: Review of records and additional review of physical, cognitive, psychosocial history related to current functional performance.   CLINICAL DECISION MAKING: Moderate - several treatment options, min-mod task modification necessary   REHAB POTENTIAL: Good   EVALUATION COMPLEXITY: Moderate       PLAN: OT FREQUENCY: 2x/week   OT DURATION: 10 weeks 16 visits over 10 weeks d/t any scheduling conflicts   PLANNED INTERVENTIONS: self care/ADL training, therapeutic exercise, therapeutic activity, neuromuscular re-education, manual therapy, ultrasound, fluidotherapy, patient/family education, cognitive remediation/compensation, visual/perceptual remediation/compensation, energy conservation, and DME and/or AE instructions   RECOMMENDED OTHER SERVICES: had ST and PT evaluation this day   CONSULTED AND AGREED WITH PLAN OF CARE: Patient and family member/caregiver   PLAN FOR NEXT SESSION: 12/24/21 coordination with LUE, dual tasks/alternating attention, LUE proximal strengthening (  light) in prone (see above from 12/07/21) - give as HEP once pt can do w/o cues, continue to work on gross motor coordination (tossing, dribbling LUE)    Zachery Conch, OT 12/24/2021, 4:08 PM

## 2021-12-27 ENCOUNTER — Ambulatory Visit: Payer: Medicare Other | Attending: Otolaryngology | Admitting: Occupational Therapy

## 2021-12-27 ENCOUNTER — Encounter: Payer: Self-pay | Admitting: Occupational Therapy

## 2021-12-27 ENCOUNTER — Ambulatory Visit: Payer: Medicare Other

## 2021-12-27 DIAGNOSIS — R278 Other lack of coordination: Secondary | ICD-10-CM

## 2021-12-27 DIAGNOSIS — R269 Unspecified abnormalities of gait and mobility: Secondary | ICD-10-CM | POA: Diagnosis present

## 2021-12-27 DIAGNOSIS — I69354 Hemiplegia and hemiparesis following cerebral infarction affecting left non-dominant side: Secondary | ICD-10-CM | POA: Diagnosis present

## 2021-12-27 DIAGNOSIS — M6281 Muscle weakness (generalized): Secondary | ICD-10-CM | POA: Diagnosis present

## 2021-12-27 DIAGNOSIS — R42 Dizziness and giddiness: Secondary | ICD-10-CM | POA: Insufficient documentation

## 2021-12-27 NOTE — Patient Instructions (Signed)
Scapular Retraction (Prone)    Lie with arms at sides. Pinch shoulder blades together and raise shoulders a few inches from floor (lower arms resting on bed) Repeat __10__ times per set.  Do _1-2___ sessions per day.   On Elbows (Prone)    Rise up on elbows as high as possible, keeping hips on floor. Hold _5___ seconds. Repeat __5-10__ times per set. Do __1-2__ sessions per day.   Extension - Prone (Dumbbell)    Lie with left arm hanging off side of bed. Lift hand back and up. Repeat __10__ times per set.  Do 1-2____ sessions per day. NO weight

## 2021-12-27 NOTE — Therapy (Signed)
OUTPATIENT OCCUPATIONAL THERAPY TREATMENT NOTE   Patient Name: [name] MRN: [id number] DOB:08/10/1945, 76 y.o., female Today's Date: 12/27/2021  PCP: Seward Carol, MD REFERRING PROVIDER: Jonetta Osgood, MD   END OF SESSION:   OT End of Session - 12/27/21 1234     Visit Number 8    Number of Visits 17    Date for OT Re-Evaluation 02/02/22    Authorization Type Medicare A&B/Tricare    Progress Note Due on Visit 10    OT Start Time 4098    OT Stop Time 1315    OT Time Calculation (min) 45 min    Activity Tolerance Patient tolerated treatment well    Behavior During Therapy [facility] for tasks assessed/performed             Past Medical History:  Diagnosis Date   Anxiety    Arthritis    Balance problem    Depression    Double vision 10/2014   bilateral   Dyslipidemia    High blood pressure    Multiple thyroid nodules    OSA (obstructive sleep apnea)    Paroxysmal SVT (supraventricular tachycardia) (HCC)    PVC (premature ventricular contraction)    intermittent   Syncope and collapse    last friday had alot of dizziness   Past Surgical History:  Procedure Laterality Date   ABDOMINAL HYSTERECTOMY     catheter ablation     TONSILLECTOMY     Patient Active Problem List   Diagnosis Date Noted   Acute CVA (cerebrovascular accident) ([place]) 11/17/2021   Hypertensive urgency 11/16/2021   Focal neurological deficit 11/16/2021   Paroxysmal atrial tachycardia ([place]) 06/18/2021   Chest pain of uncertain etiology 11/91/4782   Mixed hyperlipidemia 06/18/2021   TIA (transient ischemic attack) 06/12/2015   Amaurosis fugax    Atypical migraine    Amaurosis fugax of left eye 06/11/2015   Dizziness and giddiness 04/04/2013   Abnormality of gait 02/15/2013   Spinal stenosis of cervical region 02/15/2013   Palpitations 11/28/2012   History of supraventricular tachycardia 11/28/2012   Essential hypertension 11/28/2012   History of CVA (cerebrovascular accident)  11/28/2012    ONSET DATE: 11/16/21   REFERRING DIAG:  I16.0 (ICD-10-CM) - Hypertensive urgency  R29.818 (ICD-10-CM) - Focal neurological deficit  Z86.73 (ICD-10-CM) - History of CVA (cerebrovascular accident)    THERAPY DIAG:  Muscle weakness (generalized)  Other lack of coordination  Hemiplegia and hemiparesis following cerebral infarction affecting left non-dominant side ([place])  Rationale for Evaluation and Treatment Rehabilitation  PERTINENT HISTORY: hypertension, not on any medications currently, hyperlipidemia, vertigo, patient with previous history of TIA in 2016, with her MRA head significant for few areas of focal stenosis, no acute findings however CT significant for chronic left lacunar cerebral infarct, blood pressure was elevated 215/115  PRECAUTIONS: Fall, heart monitor  SUBJECTIVE: The theraband ex's are going well  PAIN: Are you having pain? Yes NPRS scale: 0-6/10 Pain location: knee Pain orientation: Left      TODAY'S TREATMENT:  Tossed small ball in each hand, alternating then simultaneously w/ only 1 drop Lt hand. Dribbling large ball in place Lt hand, then ambulating while dribbling ball.   Issued HEP for scapula strengthening in prone (from exercises done in 12/07/21 session) w/ less cues needed and less compensations. See pt instructions for details.   Dual tasking b/t physical task (tossing ball while walking) and cognitive tasks w/ min errors (90% accuracy or higher) During more difficult cognitive tasks,  would stop tossing ball and return to it - demo alternating attention   HOME EXERCISE PROGRAM: 12/01/21 Coordination HEP - (updated for more challenge on 12/09/21)  12/09/21: memory strategies, keeping thinking skills sharp 12/24/21 Yellow Theraband LUE for proximal strengthening 12/27/21: scapula strengthening HEP   GOALS: Goals reviewed with patient? No   SHORT TERM GOALS: Target date: 12/22/2021   Pt will be independent with HEP targeting LUE  coordination and proximal strength  Baseline: Goal status: MET   2.  Pt will verbalize understanding of adapted strategies and/or equipment PRN to increase safety and independence with ADLs and IADLs (I.e. home management tasks, bathing etc)  Baseline:  Goal status: MET     LONG TERM GOALS: Target date: 02/02/2022   Pt will be independent with updated HEPs. Baseline:  Goal status: MET   2.  Pt will complete all basic ADLs with modified independence and good safety awareness. Baseline:  Goal status: MET  3.  Pt will increase fine motor coordination in LUE  by improving 9 hole peg test by at least 5 seconds   Baseline: Right: 25.25 sec; Left: 31.33 sec Goal status: IN PROGRESS LUE 39.4s   4.  Pt will increase strength in LUE shoulder flexion evidenced by ability to reach and grab an item weight at least 3 lbs from mid/high level shelf. Baseline:  Goal status: MET   5.  Pt will increase ability to complete two tasks simultaneously by completing dual tasks and/or alternating attention tasks with 90% accuracy or greater. Baseline: diff with trail making b Goal status: MET w/ simple cognitive and physical tasks   ASSESSMENT:   CLINICAL IMPRESSION: Pt continues to progress towards goals. Pt has met all STGs and most LTG's at this time   PERFORMANCE DEFICITS in functional skills including ADLs, IADLs, coordination, dexterity, ROM, FMC, GMC, balance, decreased knowledge of use of DME, and UE functional use, cognitive skills including attention, safety awareness, and understand   IMPAIRMENTS are limiting patient from ADLs, IADLs, and leisure.    COMORBIDITIES may have co-morbidities  that affects occupational performance. Patient will benefit from skilled OT to address above impairments and improve overall function.   MODIFICATION OR ASSISTANCE TO COMPLETE EVALUATION: Min-Moderate modification of tasks or assist with assess necessary to complete an evaluation.   OT OCCUPATIONAL  PROFILE AND HISTORY: Detailed assessment: Review of records and additional review of physical, cognitive, psychosocial history related to current functional performance.   CLINICAL DECISION MAKING: Moderate - several treatment options, min-mod task modification necessary   REHAB POTENTIAL: Good   EVALUATION COMPLEXITY: Moderate       PLAN: OT FREQUENCY: 2x/week   OT DURATION: 10 weeks 16 visits over 10 weeks d/t any scheduling conflicts   PLANNED INTERVENTIONS: self care/ADL training, therapeutic exercise, therapeutic activity, neuromuscular re-education, manual therapy, ultrasound, fluidotherapy, patient/family education, cognitive remediation/compensation, visual/perceptual remediation/compensation, energy conservation, and DME and/or AE instructions   RECOMMENDED OTHER SERVICES: had ST and PT evaluation this day   CONSULTED AND AGREED WITH PLAN OF CARE: Patient and family member/caregiver   PLAN FOR NEXT SESSION: [place] LUE, check remaining goal and potential d/c next session   Hans Eden, OT 12/27/2021, 12:35 PM

## 2021-12-27 NOTE — Therapy (Signed)
OUTPATIENT PHYSICAL THERAPY TREATMENT NOTE   Patient Name: [name] MRN: [id number] DOB:10/17/1945, 76 y.o., female Today's Date: 12/27/2021  PCP: Ronald Polite, MD REFERRING PROVIDER: Ronald Polite, MD  END OF SESSION:   PT End of Session - 12/27/21 1307     Visit Number 8    Number of Visits 17    Date for PT Re-Evaluation 01/18/22    Authorization Type Medicare part A& B    PT Start Time 1318               Past Medical History:  Diagnosis Date   Anxiety    Arthritis    Balance problem    Depression    Double vision 10/2014   bilateral   Dyslipidemia    High blood pressure    Multiple thyroid nodules    OSA (obstructive sleep apnea)    Paroxysmal SVT (supraventricular tachycardia) (HCC)    PVC (premature ventricular contraction)    intermittent   Syncope and collapse    last friday had alot of dizziness   Past Surgical History:  Procedure Laterality Date   ABDOMINAL HYSTERECTOMY     catheter ablation     TONSILLECTOMY     Patient Active Problem List   Diagnosis Date Noted   Acute CVA (cerebrovascular accident) (HCC) 11/17/2021   Hypertensive urgency 11/16/2021   Focal neurological deficit 11/16/2021   Paroxysmal atrial tachycardia (HCC) 06/18/2021   Chest pain of uncertain etiology 06/18/2021   Mixed hyperlipidemia 06/18/2021   TIA (transient ischemic attack) 06/12/2015   Amaurosis fugax    Atypical migraine    Amaurosis fugax of left eye 06/11/2015   Dizziness and giddiness 04/04/2013   Abnormality of gait 02/15/2013   Spinal stenosis of cervical region 02/15/2013   Palpitations 11/28/2012   History of supraventricular tachycardia 11/28/2012   Essential hypertension 11/28/2012   History of CVA (cerebrovascular accident) 11/28/2012    REFERRING DIAG:  I16.0 (ICD-10-CM) - Hypertensive urgency  I63.9 (ICD-10-CM) - Acute CVA (cerebrovascular accident) (HCC)    THERAPY DIAG:  Muscle weakness (generalized)  Other lack of  coordination  Dizziness and giddiness  Abnormality of gait  Rationale for Evaluation and Treatment Rehabilitation  PERTINENT HISTORY: HTN, hyperlipidemia, previous TIA in 2016, Mnire's disease, SVT, prediabetes, medication non-adherence.   PRECAUTIONS: fall  SUBJECTIVE: Patient reports doing well. No falls/near falls. She reports that exercises are going well- balance is better, but not great.   PAIN:  Are you having pain? No Reports L knee hurts when standing or walking.    Vitals: BP at beginning of session: 165/64     TODAYS TREATMENT: NMR: -ambulatory dual motor task-> dual motor + cog task with ball toss naming animals (CGA required with dual cog)  -ambulatory dual motor tossing scarves back and forth -> dual motor (did not progress to dual motor cog task with this task due to patient fatigue) -rockerboard A/P EO/EC, EO head turns, EC head turns  -rockerboard lateral EO/EC, EO head turns, EC head turns  -Bosu ball (ball side up) static lunge anterior B LE (EO/EC) -Bosu ball (flat side up) static lunge anterior B LE(EO/EC)   Standing Balance: Surface: Airex Position: Feet Hip Width Apart Completed with: Eyes Closed; Head Turns x 2 sets of 10 Reps and Head Nods x 2 sets of  Reps  Cued for slowed pace when performing head motions.      PATIENT EDUCATION: Education details: HEP progressions,  balance strategies  Person educated: Patient and Spouse   Education method: Explanation, Demonstration, and Handout Education comprehension: verbalized understanding.     HOME EXERCISE PROGRAM: Access Code: [email or web address] URL: [email or web address] Date: 12/24/2021 Prepared by: Janann August  Exercises - Seated Gaze Stabilization with Head Rotation  - 1 x daily - 7 x weekly - 3 sets - 10 reps - Seated Horizontal Saccades  - 1 x daily - 7 x weekly - 3 sets - 10 reps - Seated Vertical Saccades  - 1 x daily - 7 x weekly - 3 sets - 10 reps - Seated Diagonal  Saccades  - 1 x daily - 7 x weekly - 3 sets - 10 reps - Standing Balance in Corner  - 1 x daily - 7 x weekly - 3 sets - 10 reps - Standing Balance in Corner with Eyes Closed  - 1 x daily - 7 x weekly - 3 sets - 10 reps - Semi-Tandem Corner Balance With Eyes Open  - 1 x daily - 7 x weekly - 3 sets - 10 reps - Corner Balance Feet Together With Eyes Open  - 1 x daily - 7 x weekly - 3 sets - 10 reps - Corner Balance Feet Together: Eyes Open With Head Turns  - 1 x daily - 7 x weekly - 3 sets - 10 reps - Corner Balance Feet Together With Eyes Closed  - 1 x daily - 7 x weekly - 3 sets - 10 reps - Semi-Tandem Corner Balance: Eyes Open With Head Turns  - 1 x daily - 7 x weekly - 3 sets - 10 reps - Corner Balance Feet Apart: Eyes Closed With Head Turns  - 1 x daily - 7 x weekly - 3 sets - 10 reps - Lunge Onto BOSU Ball  - 1 x daily - 7 x weekly - 3 sets - 10 reps - Seated Hamstring Stretch  - 1 x daily - 7 x weekly - 3 sets - 10 reps - Standing Marching  - 1 x daily - 5 x weekly - 1-2 sets - 10 reps   GOALS: Goals reviewed with patient? Yes   SHORT TERM GOALS: Target date: 12/07/2021   Pt will be independent with balance/strength HEP to minimize risk for falling at home.  Baseline: established Goal status: MET   2.  Pt will improve gait speed to >/= .35  m/sec to demonstrate improved community ambulation  Baseline: .68ms; .31m 12/07/21 Goal status: MET   3.  Patient will improve FGA score to >/= 19/30 for improved dynamic and ambulatory balance Baseline: 14/30; 22/30 12/07/21 Goal status: MET       LONG TERM GOALS: Target date: 01/18/2022   Pt will improve gait speed to >/= 1 m/sec to demonstrate improved community ambulation  Baseline: .2468mGoal status: INITIAL   2.  Patient will improve FGA score to >/= 28/30 Baseline: 14/30 Goal status: INITIAL   3.  Patient will be able to negotiate a full flight of stairs with no HR use to allow for improved access to her home  environment. Baseline: 12 steps with R HR use and SBA Goal status: INITIAL   ASSESSMENT:   CLINICAL IMPRESSION:  Patient seen for skilled PT session with emphasis on balance re-training. She is improving her ability to complete dual motor + cog tasks, but continues to prioritize the cog tasks. She is also improving her balance strategies using ankles and hips prior to stepping. She does have a tendency to lose balance to the L, but improved ability  to re-gain noted. Continue POC.   Today's skilled session continued to focus on balance strategies with incr vestibular input, narrow BOS, compliant surfaces, and cognitive dual tasking. Pt continues to have difficulty with cognitive dual tasking when added to a motor/balance task. Will continue to progress towards LTGs.     OBJECTIVE IMPAIRMENTS Abnormal gait, decreased activity tolerance, decreased balance, decreased coordination, and dizziness.    ACTIVITY LIMITATIONS carrying, lifting, bending, sitting, standing, squatting, sleeping, stairs, transfers, bed mobility, and locomotion level   PARTICIPATION LIMITATIONS: meal prep, interpersonal relationship, driving, shopping, community activity, and yard work   PERSONAL FACTORS Age and 3+ comorbidities: HTN, Mnire's disease, hyperlipidemia,   are also affecting patient's functional outcome.    REHAB POTENTIAL: Good   CLINICAL DECISION MAKING: Evolving/moderate complexity   EVALUATION COMPLEXITY: Moderate   PLAN: PT FREQUENCY: 2x/week   PT DURATION: 8 weeks   PLANNED INTERVENTIONS: Therapeutic exercises, Therapeutic activity, Neuromuscular re-education, Balance training, Gait training, Patient/Family education, Joint mobilization, Stair training, Vestibular training, Canalith repositioning, Visual/preceptual remediation/compensation, DME instructions, Aquatic Therapy, Cryotherapy, Moist heat, Manual therapy, and Re-evaluation   PLAN FOR NEXT SESSION: ankle/hip/stepping strategy, corner  exercises, weight shifting tasks to R, dual tasking. Pt to look at exercises at home that are getting easy and to progress them as needed.   Jennifer A Novak, PT, DPT  Jennifer A Novak, PT, DPT, CBIS  12/27/2021, 1:19 PM    

## 2021-12-30 ENCOUNTER — Ambulatory Visit: Payer: Medicare Other | Admitting: Occupational Therapy

## 2021-12-30 ENCOUNTER — Ambulatory Visit: Payer: Medicare Other

## 2022-01-04 ENCOUNTER — Ambulatory Visit: Payer: Medicare Other

## 2022-01-04 ENCOUNTER — Encounter: Payer: Medicare Other | Admitting: Occupational Therapy

## 2022-01-04 DIAGNOSIS — M6281 Muscle weakness (generalized): Secondary | ICD-10-CM | POA: Diagnosis not present

## 2022-01-04 DIAGNOSIS — R269 Unspecified abnormalities of gait and mobility: Secondary | ICD-10-CM

## 2022-01-04 DIAGNOSIS — R42 Dizziness and giddiness: Secondary | ICD-10-CM

## 2022-01-04 DIAGNOSIS — R278 Other lack of coordination: Secondary | ICD-10-CM

## 2022-01-04 NOTE — Therapy (Signed)
OUTPATIENT PHYSICAL THERAPY TREATMENT NOTE   Patient Name: [name] MRN: [id number] DOB:1946/02/13, 76 y.o., female Today's Date: 01/04/2022  PCP: Seward Carol, MD REFERRING PROVIDER: Seward Carol, MD  END OF SESSION:   PT End of Session - 01/04/22 1236     Visit Number 9    Number of Visits 17    Date for PT Re-Evaluation 01/18/22    Authorization Type Medicare part A& B    PT Start Time 1234    PT Stop Time 1315    PT Time Calculation (min) 41 min    Equipment Utilized During Treatment Gait belt    Activity Tolerance Patient tolerated treatment well    Behavior During Therapy WFL for tasks assessed/performed             Past Medical History:  Diagnosis Date   Anxiety    Arthritis    Balance problem    Depression    Double vision 10/2014   bilateral   Dyslipidemia    High blood pressure    Multiple thyroid nodules    OSA (obstructive sleep apnea)    Paroxysmal SVT (supraventricular tachycardia) (HCC)    PVC (premature ventricular contraction)    intermittent   Syncope and collapse    last friday had alot of dizziness   Past Surgical History:  Procedure Laterality Date   ABDOMINAL HYSTERECTOMY     catheter ablation     TONSILLECTOMY     Patient Active Problem List   Diagnosis Date Noted   Acute CVA (cerebrovascular accident) ([place]) 11/17/2021   Hypertensive urgency 11/16/2021   Focal neurological deficit 11/16/2021   Paroxysmal atrial tachycardia ([place]) 06/18/2021   Chest pain of uncertain etiology 50/35/4656   Mixed hyperlipidemia 06/18/2021   TIA (transient ischemic attack) 06/12/2015   Amaurosis fugax    Atypical migraine    Amaurosis fugax of left eye 06/11/2015   Dizziness and giddiness 04/04/2013   Abnormality of gait 02/15/2013   Spinal stenosis of cervical region 02/15/2013   Palpitations 11/28/2012   History of supraventricular tachycardia 11/28/2012   Essential hypertension 11/28/2012   History of CVA (cerebrovascular  accident) 11/28/2012    REFERRING DIAG:  I16.0 (ICD-10-CM) - Hypertensive urgency  I63.9 (ICD-10-CM) - Acute CVA (cerebrovascular accident) ([place])    THERAPY DIAG:  Muscle weakness (generalized)  Other lack of coordination  Dizziness and giddiness  Abnormality of gait  Rationale for Evaluation and Treatment Rehabilitation  PERTINENT HISTORY: HTN, hyperlipidemia, previous TIA in 2016, Mnire's disease, SVT, prediabetes, medication non-adherence.   PRECAUTIONS: fall  SUBJECTIVE: Patient reports doing well. No falls/near falls. She reports that exercises are going well- balance is better, but not great.   PAIN:  Are you having pain? No Reports L knee hurts when standing or walking.    Vitals: BP at beginning of session: 165/64     TODAYS TREATMENT: NMR: -Standing Balance: Surface: Floor and Pillows Position: Narrow Base of Support Feet Hip Width Apart Completed with: Eyes Open and Eyes Closed;   Single Leg Stance:   Surface: Floor and Pillows  Lower Extremity: RLE and LLE  Time: R LE- 3-5s; L LE- 5-10s  Tandem Stance:  Surface: Floor and Pillows Completed with: Eyes Open and Eyes Closed  Time: 25-30s -resisted walking 230' -> weaving thru cones with resistated walking + perturbations  -balloon toss-> ambulatory balloon toss  -balloon toss on AP rockerboard-> lateral rockerboard  -static standing on wobble board     PATIENT EDUCATION: Education details: HEP  progressions,  balance strategies  Person educated: Patient and Spouse Education method: Explanation, Demonstration, and Handout Education comprehension: verbalized understanding.     HOME EXERCISE PROGRAM: Access Code: [email or web address] URL: [email or web address] Date: 12/24/2021 Prepared by: Janann August  Exercises - Seated Gaze Stabilization with Head Rotation  - 1 x daily - 7 x weekly - 3 sets - 10 reps - Seated Horizontal Saccades  - 1 x daily - 7 x weekly - 3 sets - 10 reps -  Seated Vertical Saccades  - 1 x daily - 7 x weekly - 3 sets - 10 reps - Seated Diagonal Saccades  - 1 x daily - 7 x weekly - 3 sets - 10 reps - Standing Balance in Corner  - 1 x daily - 7 x weekly - 3 sets - 10 reps - Standing Balance in Corner with Eyes Closed  - 1 x daily - 7 x weekly - 3 sets - 10 reps - Semi-Tandem Corner Balance With Eyes Open  - 1 x daily - 7 x weekly - 3 sets - 10 reps - Corner Balance Feet Together With Eyes Open  - 1 x daily - 7 x weekly - 3 sets - 10 reps - Corner Balance Feet Together: Eyes Open With Head Turns  - 1 x daily - 7 x weekly - 3 sets - 10 reps - Corner Balance Feet Together With Eyes Closed  - 1 x daily - 7 x weekly - 3 sets - 10 reps - Semi-Tandem Corner Balance: Eyes Open With Head Turns  - 1 x daily - 7 x weekly - 3 sets - 10 reps - Corner Balance Feet Apart: Eyes Closed With Head Turns  - 1 x daily - 7 x weekly - 3 sets - 10 reps - Lunge Onto BOSU Ball  - 1 x daily - 7 x weekly - 3 sets - 10 reps - Seated Hamstring Stretch  - 1 x daily - 7 x weekly - 3 sets - 10 reps - Standing Marching  - 1 x daily - 5 x weekly - 1-2 sets - 10 reps   GOALS: Goals reviewed with patient? Yes   SHORT TERM GOALS: Target date: 12/07/2021   Pt will be independent with balance/strength HEP to minimize risk for falling at home.  Baseline: established Goal status: MET   2.  Pt will improve gait speed to >/= .35  m/sec to demonstrate improved community ambulation  Baseline: .33ms; .355m 12/07/21 Goal status: MET   3.  Patient will improve FGA score to >/= 19/30 for improved dynamic and ambulatory balance Baseline: 14/30; 22/30 12/07/21 Goal status: MET       LONG TERM GOALS: Target date: 01/18/2022   Pt will improve gait speed to >/= 1 m/sec to demonstrate improved community ambulation  Baseline: .2446mGoal status: INITIAL   2.  Patient will improve FGA score to >/= 28/30 Baseline: 14/30 Goal status: INITIAL   3.  Patient will be able to negotiate a full  flight of stairs with no HR use to allow for improved access to her home environment. Baseline: 12 steps with R HR use and SBA Goal status: INITIAL   ASSESSMENT:   CLINICAL IMPRESSION:  Patient seen for skilled PT session with emphasis on balance re-training. She tolerated progression to resisted walking with balance perturbations well with appropriate stepping strategy when indicated. Improving ability to maintain balance with eyes closed, but does demonstrate tendency toward L lateral lean/LOB. Improving ability to regain balance  noted. Appropriate and effective anticipatory and reactionary balance displayed with balloon toss, even on wobble board noted. Continue POC.      OBJECTIVE IMPAIRMENTS Abnormal gait, decreased activity tolerance, decreased balance, decreased coordination, and dizziness.    ACTIVITY LIMITATIONS carrying, lifting, bending, sitting, standing, squatting, sleeping, stairs, transfers, bed mobility, and locomotion level   PARTICIPATION LIMITATIONS: meal prep, interpersonal relationship, driving, shopping, community activity, and yard work   PERSONAL FACTORS Age and 3+ comorbidities: HTN, Mnire's disease, hyperlipidemia,   are also affecting patient's functional outcome.    REHAB POTENTIAL: Good   CLINICAL DECISION MAKING: Evolving/moderate complexity   EVALUATION COMPLEXITY: Moderate   PLAN: PT FREQUENCY: 2x/week   PT DURATION: 8 weeks   PLANNED INTERVENTIONS: Therapeutic exercises, Therapeutic activity, Neuromuscular re-education, Balance training, Gait training, Patient/Family education, Joint mobilization, Stair training, Vestibular training, Canalith repositioning, Visual/preceptual remediation/compensation, DME instructions, Aquatic Therapy, Cryotherapy, Moist heat, Manual therapy, and Re-evaluation   PLAN FOR NEXT SESSION: ankle/hip/stepping strategy, corner exercises, weight shifting tasks to R, dual tasking. Pt to look at exercises at home that are  getting easy and to progress them as needed; SOT next visit, wobble board/rockerboard  Debbora Dus, PT, DPT  Debbora Dus, PT, DPT, CBIS  01/04/2022, 1:49 PM

## 2022-01-06 ENCOUNTER — Encounter: Payer: Self-pay | Admitting: Occupational Therapy

## 2022-01-06 ENCOUNTER — Ambulatory Visit: Payer: Medicare Other | Admitting: Occupational Therapy

## 2022-01-06 ENCOUNTER — Ambulatory Visit: Payer: Medicare Other

## 2022-01-06 DIAGNOSIS — R278 Other lack of coordination: Secondary | ICD-10-CM

## 2022-01-06 DIAGNOSIS — M6281 Muscle weakness (generalized): Secondary | ICD-10-CM

## 2022-01-06 DIAGNOSIS — I69354 Hemiplegia and hemiparesis following cerebral infarction affecting left non-dominant side: Secondary | ICD-10-CM

## 2022-01-06 DIAGNOSIS — R42 Dizziness and giddiness: Secondary | ICD-10-CM

## 2022-01-06 NOTE — Therapy (Signed)
OUTPATIENT OCCUPATIONAL THERAPY TREATMENT NOTE & DISCHARGE   Patient Name: LAURETTA SALLAS MRN: [id number] DOB:1945/08/26, 76 y.o., female Today's Date: 01/06/2022  PCP: Seward Carol, MD REFERRING PROVIDER: Jonetta Osgood, MD   END OF SESSION:   OT End of Session - 01/06/22 1401     Visit Number 9    Number of Visits 17    Date for OT Re-Evaluation 02/02/22    Authorization Type Medicare A&B/[employer]    Progress Note Due on Visit 10    OT Start Time 1402    OT Stop Time 1433   d/c session   OT Time Calculation (min) 31 min    Activity Tolerance Patient tolerated treatment well    Behavior During Therapy [facility] for tasks assessed/performed             Past Medical History:  Diagnosis Date   Anxiety    Arthritis    Balance problem    Depression    Double vision 10/2014   bilateral   Dyslipidemia    High blood pressure    Multiple thyroid nodules    OSA (obstructive sleep apnea)    Paroxysmal SVT (supraventricular tachycardia) (HCC)    PVC (premature ventricular contraction)    intermittent   Syncope and collapse    last friday had alot of dizziness   Past Surgical History:  Procedure Laterality Date   ABDOMINAL HYSTERECTOMY     catheter ablation     TONSILLECTOMY     Patient Active Problem List   Diagnosis Date Noted   Acute CVA (cerebrovascular accident) ([place]) 11/17/2021   Hypertensive urgency 11/16/2021   Focal neurological deficit 11/16/2021   Paroxysmal atrial tachycardia ([place]) 06/18/2021   Chest pain of uncertain etiology 35/46/5681   Mixed hyperlipidemia 06/18/2021   TIA (transient ischemic attack) 06/12/2015   Amaurosis fugax    Atypical migraine    Amaurosis fugax of left eye 06/11/2015   Dizziness and giddiness 04/04/2013   Abnormality of gait 02/15/2013   Spinal stenosis of cervical region 02/15/2013   Palpitations 11/28/2012   History of supraventricular tachycardia 11/28/2012   Essential hypertension 11/28/2012   History of CVA  (cerebrovascular accident) 11/28/2012    ONSET DATE: 11/16/21   REFERRING DIAG:  I16.0 (ICD-10-CM) - Hypertensive urgency  R29.818 (ICD-10-CM) - Focal neurological deficit  Z86.73 (ICD-10-CM) - History of CVA (cerebrovascular accident)    THERAPY DIAG:  Muscle weakness (generalized)  Other lack of coordination  Hemiplegia and hemiparesis following cerebral infarction affecting left non-dominant side ([place])  Rationale for Evaluation and Treatment Rehabilitation  PERTINENT HISTORY: hypertension, not on any medications currently, hyperlipidemia, vertigo, patient with previous history of TIA in 2016, with her MRA head significant for few areas of focal stenosis, no acute findings however CT significant for chronic left lacunar cerebral infarct, blood pressure was elevated 215/115  PRECAUTIONS: Fall, heart monitor  SUBJECTIVE: The theraband ex's are going well  PAIN: Are you having pain? Yes NPRS scale: 0-6/10 Pain location: knee Pain orientation: Left      TODAY'S TREATMENT:  01/06/22  Small Peg Board with LUE with one at a time for placing pegs into board. Pt removed with in hand manipulation. Pt copied pattern with 100% accuracy. Pt req'd no additional cues and completed with minimal difficulty and drops.   Assessed 9 hole peg test - pt met goal. See below.  Grooved Pegs with LUE for increased coordination. Pt placed pegs with one at a time while holding dice in ulnar  side of palm and removed with in hand manipulation. Pt completed with min difficulty and min drops.  Constant Therapy Read a Map level 1 with 80% accuracy and 64.14s response time. Pt req'd min/mod cues for attending to entire map and attending to details.   OCCUPATIONAL THERAPY DISCHARGE SUMMARY  Visits from Start of Care: 9  Current functional level related to goals / functional outcomes: Pt has improved with overall LUE coordination and strength and alternating attention and overall attention.    Remaining deficits: Pt continues to demonstrate some difficulty with overall attention to multiple tasks and coordination.    Education / Equipment: HEPs and strategies   Patient agrees to discharge. Patient goals were met. Patient is being discharged due to meeting the stated rehab goals.Marland Kitchen    HOME EXERCISE PROGRAM: 12/01/21 Coordination HEP - (updated for more challenge on 12/09/21)  12/09/21: memory strategies, keeping thinking skills sharp 12/24/21 Yellow Theraband LUE for proximal strengthening 12/27/21: scapula strengthening HEP   GOALS: Goals reviewed with patient? No   SHORT TERM GOALS: Target date: 12/22/2021   Pt will be independent with HEP targeting LUE coordination and proximal strength  Baseline: Goal status: MET   2.  Pt will verbalize understanding of adapted strategies and/or equipment PRN to increase safety and independence with ADLs and IADLs (I.e. home management tasks, bathing etc)  Baseline:  Goal status: MET     LONG TERM GOALS: Target date: 02/02/2022   Pt will be independent with updated HEPs. Baseline:  Goal status: MET   2.  Pt will complete all basic ADLs with modified independence and good safety awareness. Baseline:  Goal status: MET  3.  Pt will increase fine motor coordination in LUE  by improving 9 hole peg test by at least 5 seconds   Baseline: Right: 25.25 sec; Left: 31.33 sec Goal status: MET LUE 24.48   4.  Pt will increase strength in LUE shoulder flexion evidenced by ability to reach and grab an item weight at least 3 lbs from mid/high level shelf. Baseline:  Goal status: MET   5.  Pt will increase ability to complete two tasks simultaneously by completing dual tasks and/or alternating attention tasks with 90% accuracy or greater. Baseline: diff with trail making b Goal status: MET w/ simple cognitive and physical tasks   ASSESSMENT:   CLINICAL IMPRESSION: Pt has met all goals and is ready for d/c from occupational therapy at this  time.   PERFORMANCE DEFICITS in functional skills including ADLs, IADLs, coordination, dexterity, ROM, FMC, GMC, balance, decreased knowledge of use of DME, and UE functional use, cognitive skills including attention, safety awareness, and understand   IMPAIRMENTS are limiting patient from ADLs, IADLs, and leisure.    COMORBIDITIES may have co-morbidities  that affects occupational performance. Patient will benefit from skilled OT to address above impairments and improve overall function.   MODIFICATION OR ASSISTANCE TO COMPLETE EVALUATION: Min-Moderate modification of tasks or assist with assess necessary to complete an evaluation.   OT OCCUPATIONAL PROFILE AND HISTORY: Detailed assessment: Review of records and additional review of physical, cognitive, psychosocial history related to current functional performance.   CLINICAL DECISION MAKING: Moderate - several treatment options, min-mod task modification necessary   REHAB POTENTIAL: Good   EVALUATION COMPLEXITY: Moderate       PLAN: OT FREQUENCY: 2x/week   OT DURATION: 10 weeks 16 visits over 10 weeks d/t any scheduling conflicts   PLANNED INTERVENTIONS: self care/ADL training, therapeutic exercise, therapeutic activity, neuromuscular re-education, manual  therapy, ultrasound, fluidotherapy, patient/family education, cognitive remediation/compensation, visual/perceptual remediation/compensation, energy conservation, and DME and/or AE instructions   RECOMMENDED OTHER SERVICES: had ST and PT evaluation this day   CONSULTED AND AGREED WITH PLAN OF CARE: Patient and family member/caregiver   PLAN FOR NEXT SESSION: d/c OT   Zachery Conch, OT 01/06/2022, 2:33 PM

## 2022-01-06 NOTE — Therapy (Signed)
OUTPATIENT PHYSICAL THERAPY TREATMENT NOTE   Patient Name: ILYNN Taylor MRN: [id number] DOB:[birth year], 76 y.o., female Today's Date: 01/06/2022  PCP: Seward Carol, MD REFERRING PROVIDER: Seward Carol, MD  END OF SESSION:   PT End of Session - 01/06/22 1318     Visit Number 10    Number of Visits 17    Date for PT Re-Evaluation 01/18/22    Authorization Type Medicare part A& B    Progress Note Due on Visit 10    PT Start Time 9562    PT Stop Time 1400    PT Time Calculation (min) 43 min    Activity Tolerance Patient tolerated treatment well    Behavior During Therapy [facility] for tasks assessed/performed             Past Medical History:  Diagnosis Date   Anxiety    Arthritis    Balance problem    Depression    Double vision 10/2014   bilateral   Dyslipidemia    High blood pressure    Multiple thyroid nodules    OSA (obstructive sleep apnea)    Paroxysmal SVT (supraventricular tachycardia) (HCC)    PVC (premature ventricular contraction)    intermittent   Syncope and collapse    last friday had alot of dizziness   Past Surgical History:  Procedure Laterality Date   ABDOMINAL HYSTERECTOMY     catheter ablation     TONSILLECTOMY     Patient Active Problem List   Diagnosis Date Noted   Acute CVA (cerebrovascular accident) ([place]) 11/17/2021   Hypertensive urgency 11/16/2021   Focal neurological deficit 11/16/2021   Paroxysmal atrial tachycardia ([place]) 06/18/2021   Chest pain of uncertain etiology 13/01/6577   Mixed hyperlipidemia 06/18/2021   TIA (transient ischemic attack) 06/12/2015   Amaurosis fugax    Atypical migraine    Amaurosis fugax of left eye 06/11/2015   Dizziness and giddiness 04/04/2013   Abnormality of gait 02/15/2013   Spinal stenosis of cervical region 02/15/2013   Palpitations 11/28/2012   History of supraventricular tachycardia 11/28/2012   Essential hypertension 11/28/2012   History of CVA (cerebrovascular accident) 11/28/2012     REFERRING DIAG:  I16.0 (ICD-10-CM) - Hypertensive urgency  I63.9 (ICD-10-CM) - Acute CVA (cerebrovascular accident) ([place])    THERAPY DIAG:  Muscle weakness (generalized)  Other lack of coordination  Dizziness and giddiness  Rationale for Evaluation and Treatment Rehabilitation  PERTINENT HISTORY: HTN, hyperlipidemia, previous TIA in 2016, Mnire's disease, SVT, prediabetes, medication non-adherence.   PRECAUTIONS: fall  SUBJECTIVE: Patient reports doing well. No falls/near falls. Patient reports that B LE feel "achy"  PAIN:  Are you having pain? No Reports L knee hurts when standing or walking.    TODAYS TREATMENT: NMR: -obstacle course: walk over uneven ground, tandem walk, weave thru cones, standing on foam toe tap to 4" box x3 laps (progress to dual cog task)  -standing on wobble board EO 2x30s  -standing on rockerboard AP weight shift onto airex -ankle AROM on wobble board B LE  Theract:  [facility] PT Assessment - 01/06/22 0001       Standardized Balance Assessment   10 Meter Walk .15ms      Functional Gait  Assessment   Gait assessed  Yes    Gait Level Surface Walks 20 ft in less than 7 sec but greater than 5.5 sec, uses assistive device, slower speed, mild gait deviations, or deviates 6-10 in outside of the 12 in walkway width.  Change in Gait Speed Able to smoothly change walking speed without loss of balance or gait deviation. Deviate no more than 6 in outside of the 12 in walkway width.    Gait with Horizontal Head Turns Performs head turns smoothly with slight change in gait velocity (eg, minor disruption to smooth gait path), deviates 6-10 in outside 12 in walkway width, or uses an assistive device.    Gait with Vertical Head Turns Performs head turns with no change in gait. Deviates no more than 6 in outside 12 in walkway width.    Gait and Pivot Turn Pivot turns safely within 3 sec and stops quickly with no loss of balance.    Step Over Obstacle Is able  to step over 2 stacked shoe boxes taped together (9 in total height) without changing gait speed. No evidence of imbalance.    Gait with Narrow Base of Support Ambulates 7-9 steps.    Gait with Eyes Closed Walks 20 ft, no assistive devices, good speed, no evidence of imbalance, normal gait pattern, deviates no more than 6 in outside 12 in walkway width. Ambulates 20 ft in less than 7 sec.    Ambulating Backwards Walks 20 ft, uses assistive device, slower speed, mild gait deviations, deviates 6-10 in outside 12 in walkway width.    Steps Alternating feet, must use rail.    Total Score 25                PATIENT EDUCATION: Education details: HEP progressions,  balance strategies  Person educated: Patient and Spouse Education method: Explanation, Demonstration, and Handout Education comprehension: verbalized understanding.     HOME EXERCISE PROGRAM: Access Code: [email or web address] URL: [email or web address] Date: 12/24/2021 Prepared by: Janann August  Exercises - Seated Gaze Stabilization with Head Rotation  - 1 x daily - 7 x weekly - 3 sets - 10 reps - Seated Horizontal Saccades  - 1 x daily - 7 x weekly - 3 sets - 10 reps - Seated Vertical Saccades  - 1 x daily - 7 x weekly - 3 sets - 10 reps - Seated Diagonal Saccades  - 1 x daily - 7 x weekly - 3 sets - 10 reps - Standing Balance in Corner  - 1 x daily - 7 x weekly - 3 sets - 10 reps - Standing Balance in Corner with Eyes Closed  - 1 x daily - 7 x weekly - 3 sets - 10 reps - Semi-Tandem Corner Balance With Eyes Open  - 1 x daily - 7 x weekly - 3 sets - 10 reps - Corner Balance Feet Together With Eyes Open  - 1 x daily - 7 x weekly - 3 sets - 10 reps - Corner Balance Feet Together: Eyes Open With Head Turns  - 1 x daily - 7 x weekly - 3 sets - 10 reps - Corner Balance Feet Together With Eyes Closed  - 1 x daily - 7 x weekly - 3 sets - 10 reps - Semi-Tandem Corner Balance: Eyes Open With Head Turns  - 1 x daily - 7 x weekly  - 3 sets - 10 reps - Corner Balance Feet Apart: Eyes Closed With Head Turns  - 1 x daily - 7 x weekly - 3 sets - 10 reps - Lunge Onto BOSU Ball  - 1 x daily - 7 x weekly - 3 sets - 10 reps - Seated Hamstring Stretch  - 1 x daily - 7 x weekly - 3 sets -  10 reps - Standing Marching  - 1 x daily - 5 x weekly - 1-2 sets - 10 reps - Single Leg Stance with Support  - 1 x daily - 7 x weekly - 3 sets - 10 reps   GOALS: Goals reviewed with patient? Yes   SHORT TERM GOALS: Target date: 12/07/2021   Pt will be independent with balance/strength HEP to minimize risk for falling at home.  Baseline: established Goal status: MET   2.  Pt will improve gait speed to >/= .35  m/sec to demonstrate improved community ambulation  Baseline: .44ms; .355m 12/07/21; .4633mon 01/06/22 Goal status: MET   3.  Patient will improve FGA score to >/= 19/30 for improved dynamic and ambulatory balance Baseline: 14/30; 22/30 12/07/21; 25/30 01/06/22 Goal status: MET       LONG TERM GOALS: Target date: 01/18/2022   Pt will improve gait speed to >/= 1 m/sec to demonstrate improved community ambulation  Baseline: .82m10moal status: INITIAL   2.  Patient will improve FGA score to >/= 28/30 Baseline: 14/30 Goal status: INITIAL   3.  Patient will be able to negotiate a full flight of stairs with no HR use to allow for improved access to her home environment. Baseline: 12 steps with R HR use and SBA Goal status: INITIAL   ASSESSMENT:   CLINICAL IMPRESSION:  Patient seen for skilled PT session with emphasis on balance re-training. She tolerated obstacle course with balance challenges well with noted increased difficulty with dual task progressions. Would benefit from further ankle strengthening as well as proprioceptive work. Patient scored 25/30 on  Functional Gait Assessment.   <22/30 = predictive of falls, <20/30 = fall in 6 months, <18/30 = predictive of falls in PD MCID: 5 points stroke population, 4 points  geriatric population (ANPTA Core Set of Outcome Measures for Adults with Neurologic Conditions, 2018). 10 Meter Walk Test: Patient instructed to walk 10 meters (32.8 ft) as quickly and as safely as possible at their normal speed x2 and at a fast speed x2. Time measured from 2 meter mark to 8 meter mark to accommodate ramp-up and ramp-down.  Normal speed:.34m/65mt off scores: <0.4 m/s = household Ambulator, 0.4-0.8 m/s = limited community Ambulator, >0.8 m/s = community Ambulator, >1.2 m/s = crossing a street, <1.0 = increased fall risk MCID 0.05 m/s (small), 0.13 m/s (moderate), 0.06 m/s (significant)  (ANPTA Core Set of Outcome Measures for Adults with Neurologic Conditions, 2018). Continue POC.     OBJECTIVE IMPAIRMENTS Abnormal gait, decreased activity tolerance, decreased balance, decreased coordination, and dizziness.    ACTIVITY LIMITATIONS carrying, lifting, bending, sitting, standing, squatting, sleeping, stairs, transfers, bed mobility, and locomotion level   PARTICIPATION LIMITATIONS: meal prep, interpersonal relationship, driving, shopping, community activity, and yard work   PERSONAL FACTORS Age and 3+ comorbidities: HTN, Mnire's disease, hyperlipidemia,   are also affecting patient's functional outcome.    REHAB POTENTIAL: Good   CLINICAL DECISION MAKING: Evolving/moderate complexity   EVALUATION COMPLEXITY: Moderate   PLAN: PT FREQUENCY: 2x/week   PT DURATION: 8 weeks   PLANNED INTERVENTIONS: Therapeutic exercises, Therapeutic activity, Neuromuscular re-education, Balance training, Gait training, Patient/Family education, Joint mobilization, Stair training, Vestibular training, Canalith repositioning, Visual/preceptual remediation/compensation, DME instructions, Aquatic Therapy, Cryotherapy, Moist heat, Manual therapy, and Re-evaluation   PLAN FOR NEXT SESSION: ankle/hip/stepping strategy, corner exercises, weight shifting tasks to R, dual tasking. Pt to look at  exercises at home that are getting easy and to progress them as needed; SOT  next visit, wobble board/rockerboard  Debbora Dus, PT, DPT  Debbora Dus, PT, DPT, CBIS  01/06/2022, 2:05 PM

## 2022-01-07 ENCOUNTER — Inpatient Hospital Stay (HOSPITAL_BASED_OUTPATIENT_CLINIC_OR_DEPARTMENT_OTHER): Admission: RE | Admit: 2022-01-07 | Payer: Medicare Other | Source: Ambulatory Visit

## 2022-01-11 ENCOUNTER — Ambulatory Visit: Payer: Medicare Other | Admitting: Physical Therapy

## 2022-01-11 ENCOUNTER — Encounter: Payer: Medicare Other | Admitting: Occupational Therapy

## 2022-01-12 ENCOUNTER — Ambulatory Visit: Payer: Medicare Other | Admitting: Nurse Practitioner

## 2022-01-12 ENCOUNTER — Other Ambulatory Visit (HOSPITAL_BASED_OUTPATIENT_CLINIC_OR_DEPARTMENT_OTHER): Payer: Medicare Other

## 2022-01-13 ENCOUNTER — Encounter: Payer: Medicare Other | Admitting: Occupational Therapy

## 2022-01-13 ENCOUNTER — Ambulatory Visit: Payer: Medicare Other | Admitting: Physical Therapy

## 2022-01-18 ENCOUNTER — Ambulatory Visit: Payer: Medicare Other

## 2022-01-18 ENCOUNTER — Telehealth: Payer: Self-pay | Admitting: Nurse Practitioner

## 2022-01-18 ENCOUNTER — Encounter: Payer: Medicare Other | Admitting: Occupational Therapy

## 2022-01-18 DIAGNOSIS — R002 Palpitations: Secondary | ICD-10-CM

## 2022-01-18 NOTE — Telephone Encounter (Signed)
Patient is following up. She would also like to know if an A1C panel can be added on for labs tomorrow. I informed her, per previous note, Marcelino Duster, NP is out of office. Is anyone else able to assist with this? Please advise patient.

## 2022-01-18 NOTE — Telephone Encounter (Signed)
  Pt is asking if [name] can add thyroid panel to her lab work tomorrow. She also wants to confirm if triglyceride is included on her lipid panel lab. She said, she will have PT today, if she unable to answer her phone to leave her a detailed message

## 2022-01-18 NOTE — Telephone Encounter (Signed)
Spoke with Dr. Anne Fu, he is fine with ordering a TSH. Told patient to follow up with her PCP about the HgbA1c. Patient's last HgbA1c was in May. Added TSH order to lab schedule.

## 2022-01-19 ENCOUNTER — Other Ambulatory Visit: Payer: Medicare Other

## 2022-01-19 DIAGNOSIS — G459 Transient cerebral ischemic attack, unspecified: Secondary | ICD-10-CM

## 2022-01-19 DIAGNOSIS — I471 Supraventricular tachycardia: Secondary | ICD-10-CM

## 2022-01-19 LAB — LIPID PANEL
Chol/HDL Ratio: 3 ratio (ref 0.0–4.4)
Cholesterol, Total: 125 mg/dL (ref 100–199)
HDL: 42 mg/dL (ref >39)
LDL Chol Calc (NIH): 56 mg/dL (ref 0–99)
Triglycerides: 162 mg/dL — ABNORMAL HIGH (ref 0–149)
VLDL Cholesterol Cal: 27 mg/dL (ref 5–40)

## 2022-01-20 ENCOUNTER — Encounter: Payer: Medicare Other | Admitting: Occupational Therapy

## 2022-01-20 ENCOUNTER — Ambulatory Visit: Payer: Medicare Other

## 2022-01-20 LAB — TSH: TSH: 2.11 u[IU]/mL (ref 0.450–4.500)

## 2022-01-20 NOTE — Therapy (Unsigned)
[facility] [address] [place], [place], [address] Phone: [phone]   Fax:  [phone]  Patient Details  Name: [name] MRN: [id number] Date of Birth: 09-16-1945 Referring Provider:  No ref. provider found  Encounter Date: 01/20/2022  PHYSICAL THERAPY DISCHARGE SUMMARY  Visits from Start of Care: 10  Current functional level related to goals / functional outcomes: Patient did not complete the full recommended POC and chose to self- dc before formal final goal assessment. At last assessment, she ambulated at a speed of .81m/s and scored a 25/30 on the FGA. 10 Meter Walk Test: Patient instructed to walk 10 meters (32.8 ft) as quickly and as safely as possible at their normal speed x2 and at a fast speed x2. Time measured from 2 meter mark to 8 meter mark to accommodate ramp-up and ramp-down.  Normal speed: .51m/s  Cut off scores: <0.4 m/s = household Ambulator, 0.4-0.8 m/s = limited community Ambulator, >0.8 m/s = community Ambulator, >1.2 m/s = crossing a street, <1.0 = increased fall risk MCID 0.05 m/s (small), 0.13 m/s (moderate), 0.06 m/s (significant)  (ANPTA Core Set of Outcome Measures for Adults with Neurologic Conditions, 2018). Patient scored a 25/30 on  Functional Gait Assessment.   <22/30 = predictive of falls, <20/30 = fall in 6 months, <18/30 = predictive of falls in PD MCID: 5 points stroke population, 4 points geriatric population (ANPTA Core Set of Outcome Measures for Adults with Neurologic Conditions, 2018).      Remaining deficits: She continues to have a strong visual dependency in order to maintain her balance.    Education / Equipment: HEP, PT POC, OM results   Patient agrees to discharge. Patient goals were  Unable to formally assess LTGs as patient choose to self-dc . Patient is being discharged due to being pleased with the current functional level.  Westley Foots, PT 01/20/2022,  4:22 PM  [facility] [address] [place], [place], [address] Phone: [phone]   Fax:  [phone]

## 2022-02-01 ENCOUNTER — Ambulatory Visit (HOSPITAL_BASED_OUTPATIENT_CLINIC_OR_DEPARTMENT_OTHER)
Admission: RE | Admit: 2022-02-01 | Discharge: 2022-02-01 | Disposition: A | Discharge status: Home / Self Care | Payer: Medicare Other | Source: Ambulatory Visit | Attending: Nurse Practitioner | Admitting: Nurse Practitioner

## 2022-02-01 DIAGNOSIS — I471 Supraventricular tachycardia: Secondary | ICD-10-CM | POA: Insufficient documentation

## 2022-02-01 DIAGNOSIS — G459 Transient cerebral ischemic attack, unspecified: Secondary | ICD-10-CM | POA: Insufficient documentation

## 2022-02-02 ENCOUNTER — Ambulatory Visit (INDEPENDENT_AMBULATORY_CARE_PROVIDER_SITE_OTHER): Payer: Medicare Other | Admitting: Neurology

## 2022-02-02 ENCOUNTER — Encounter: Payer: Self-pay | Admitting: Neurology

## 2022-02-02 VITALS — BP 153/77 | HR 72 | Ht 61.0 in | Wt 141.0 lb

## 2022-02-02 DIAGNOSIS — I639 Cerebral infarction, unspecified: Secondary | ICD-10-CM | POA: Diagnosis not present

## 2022-02-02 DIAGNOSIS — R931 Abnormal findings on diagnostic imaging of heart and coronary circulation: Secondary | ICD-10-CM

## 2022-02-02 DIAGNOSIS — E782 Mixed hyperlipidemia: Secondary | ICD-10-CM | POA: Diagnosis not present

## 2022-02-02 DIAGNOSIS — H903 Sensorineural hearing loss, bilateral: Secondary | ICD-10-CM | POA: Insufficient documentation

## 2022-02-02 DIAGNOSIS — I672 Cerebral atherosclerosis: Secondary | ICD-10-CM

## 2022-02-02 DIAGNOSIS — Z8669 Personal history of other diseases of the nervous system and sense organs: Secondary | ICD-10-CM | POA: Diagnosis not present

## 2022-02-02 DIAGNOSIS — H9312 Tinnitus, left ear: Secondary | ICD-10-CM | POA: Insufficient documentation

## 2022-02-02 NOTE — Progress Notes (Signed)
[facility] [address] [place]. [place] [address] [phone]       OFFICE FOLLOW-UP NOTE  [name] Date of Birth:  January 26, 1946 Medical Record Number:  [id number]   HPI: 76 year old Caucasian lady seen today for initial office follow-up visit following consultation for stroke in May 2023.  She is accompanied by husband.  History is obtained from them, review of electronic medical records and I personally reviewed pertinent available imaging films in PACS.[name] is a 76 y.o. female with history of hypertension, hyperlipidemia, prediabetes, medication nonadherence, SVT, anxiety/depression, Mnire's disease, remote smoking history presenting with dizziness, left arm weakness, nausea, and headache. Woke up at 2am on 11/16/21 and was dizzy. She was fine on 5/22 when she went to bed. 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 She is still experiences some dizziness, however it is better when she is still. PT recommending outpatient vestibular therapy. 2D echo shows EF 65-70%.  LDL cholesterol was elevated 154 mg percent and hemoglobin A1c was 6.3.  Patient was started on aspirin and Plavix for 3 weeks followed by aspirin alone.  She states she is doing well.  She is finished outpatient physical and occupational therapy which she did for 6 weeks.  She is able to walk quite securely by herself without a cane.  She states her blood pressure is getting better though it does fluctuate a bit.  She recently underwent a coronary calcium score which was quite elevated at 444.  She plans to see cardiologist Dr. Anne Fu to discuss this and further cardiac workup.  She is tolerating Crestor well without side effects.  She had follow-up lipid profile done a  few weeks ago in primary care physician's office which was satisfactory but I do not have the results for review.  She has longstanding history of Mnire's disease and had an attack a month ago.  Reports that she had some transient nausea vomiting dizziness and feels she may be another attack.  She is tolerating aspirin well without bruising or bleeding.  Blood pressure slightly elevated in office today at 176/76 and came down to 153/77.  ROS:   14 system review of systems is positive for dizziness, ataxia, imbalance all other systems negative  PMH:  Past Medical History:  Diagnosis Date   Anxiety    Arthritis    Balance problem    Depression    Double vision 10/2014   bilateral   Dyslipidemia    High blood pressure    Multiple thyroid nodules    OSA (obstructive sleep apnea)    Paroxysmal SVT (supraventricular tachycardia) (HCC)    PVC (premature ventricular contraction)    intermittent   Syncope and collapse    last friday had alot of dizziness    Social History:  Social History   Socioeconomic History   Marital status: Married    Spouse name: [name]   Number of children: 0   Years of education: 12   Highest education level: Not on file  Occupational History    Employer: RETIRED    Comment: retired  Tobacco Use   Smoking status: Former    Packs/day: 0.00    Years: 20.00    Total pack years: 0.00    Types: Cigarettes    Quit date: 12/11/1984  Years since quitting: 37.1   Smokeless tobacco: Never   Tobacco comments:    Quit 40 years ago  Vaping Use   Vaping Use: Never used  Substance and Sexual Activity   Alcohol use: No   Drug use: No   Sexual activity: Yes    Partners: Male    Comment: married- 52 yrs   Other Topics Concern   Not on file  Social History Narrative   Patient lives at home with [name]). Patient is retired.    Social Determinants of Health   Financial Resource Strain: Not on file  Food Insecurity: Not on file  Transportation  Needs: Not on file  Physical Activity: Not on file  Stress: Not on file  Social Connections: Not on file  Intimate Partner Violence: Not on file    Medications:   Current Outpatient Medications on File Prior to Visit  Medication Sig Dispense Refill   amLODipine (NORVASC) 5 MG tablet Take 1 tablet (5 mg total) by mouth daily. 30 tablet 2   aspirin EC 81 MG tablet Take 1 tablet (81 mg total) by mouth daily. Swallow whole. 30 tablet 2   ondansetron (ZOFRAN-ODT) 4 MG disintegrating tablet Take 1 tablet (4 mg total) by mouth every 8 (eight) hours as needed for nausea or vomiting. 20 tablet 0   rosuvastatin (CRESTOR) 40 MG tablet Take 1 tablet (40 mg total) by mouth daily. 30 tablet 2   No current facility-administered medications on file prior to visit.    Allergies:   Allergies  Allergen Reactions   Epinephrine Itching   Contrast Media [Iodinated Contrast Media] Itching    Pt had itchy nose and dry throat and was told she was allergic to IV contrast and does not want to have it.  PT DENIES HAVING A CONTRAST ALLERGY   Keflex [Cephalexin] Swelling    Physical Exam General: well developed, well nourished pleasant elderly Caucasian lady, seated, in no evident distress Head: head normocephalic and atraumatic.  Neck: supple with no carotid or supraclavicular bruits Cardiovascular: regular rate and rhythm, no murmurs Musculoskeletal: no deformity Skin:  no rash/petichiae Vascular:  Normal pulses all extremities Vitals:   02/02/22 1037 02/02/22 1041  BP: (!) 176/76 (!) 153/77  Pulse: 76 72   Neurologic Exam Mental Status: Awake and fully alert. Oriented to place and time. Recent and remote memory intact. Attention span, concentration and fund of knowledge appropriate. Mood and affect appropriate.  Cranial Nerves: Fundoscopic exam reveals sharp disc margins. Pupils equal, briskly reactive to light. Extraocular movements full without nystagmus. Visual fields full to confrontation.  Hearing intact. Facial sensation intact. Face, tongue, palate moves normally and symmetrically.  Motor: Normal bulk and tone. Normal strength in all tested extremity muscles. Sensory.: intact to touch ,pinprick .position and vibratory sensation.  Coordination: Rapid alternating movements normal in all extremities. Finger-to-nose and heel-to-shin performed accurately bilaterally. Gait and Station: Arises from chair without difficulty. Stance is normal. Gait slightly wide-based but steady.. Able to heel, toe and tandem walk with moderate difficulty.  Reflexes: 1+ and symmetric. Toes downgoing.   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  Vascular risk factors of hypertension, hyperlipidemia, coronary artery disease and intracranial atherosclerosis.     PLAN: I had a long d/w patient and her husband about her recent cerebellar stroke, intracranial stenosis, risk for recurrent stroke/TIAs, personally independently  reviewed imaging studies and stroke evaluation results and answered questions.Continue aspirin 81 mg daily  for secondary stroke prevention and maintain strict control of hypertension with blood pressure goal below 130/90, diabetes with hemoglobin A1c goal below 6.5% and lipids with LDL cholesterol goal below 70 mg/dL. I also advised the patient to eat a healthy diet with plenty of whole grains, cereals, fruits and vegetables, exercise regularly and maintain ideal body weight.  I advised her to see cardiologist Dr. Anne Fu to discuss her elevated coronary calcium score see family physician Dr. Nehemiah Settle for treatment for her Mnire's disease and depression.  Followup in the future with me in 6 months or call earlier if necessary. Greater than 50% of time during this 42 minute visit was spent on counseling,explanation of diagnosis, planning of further  management, discussion with patient and family and coordination of care Delia Heady, MD Note: This document was prepared with digital dictation and possible smart phrase technology. Any transcriptional errors that result from this process are unintentional

## 2022-02-02 NOTE — Patient Instructions (Addendum)
I had a long d/w patient and her husband about her recent cerebellar stroke, intracranial stenosis, risk for recurrent stroke/TIAs, personally independently reviewed imaging studies and stroke evaluation results and answered questions.Continue aspirin 81 mg daily  for secondary stroke prevention and maintain strict control of hypertension with blood pressure goal below 130/90, diabetes with hemoglobin A1c goal below 6.5% and lipids with LDL cholesterol goal below 70 mg/dL. I also advised the patient to eat a healthy diet with plenty of whole grains, cereals, fruits and vegetables, exercise regularly and maintain ideal body weight.  I advised her to see cardiologist Dr. Anne Fu to discuss her elevated coronary calcium score see family physician Dr. Nehemiah Settle for treatment for her Mnire's disease and depression.  Followup in the future with me in 6 months or call earlier if necessary.  Stroke Prevention Some medical conditions and behaviors can lead to a higher chance of having a stroke. You can help prevent a stroke by eating healthy, exercising, not smoking, and managing any medical conditions you have. Stroke is a leading cause of functional impairment. Primary prevention is particularly important because a majority of strokes are first-time events. Stroke changes the lives of not only those who experience a stroke but also their family and other caregivers. How can this condition affect me? A stroke is a medical emergency and should be treated right away. A stroke can lead to brain damage and can sometimes be life-threatening. If a person gets medical treatment right away, there is a better chance of surviving and recovering from a stroke. What can increase my risk? The following medical conditions may increase your risk of a stroke: Cardiovascular disease. High blood pressure (hypertension). Diabetes. High cholesterol. Sickle cell disease. Blood clotting disorders (hypercoagulable  state). Obesity. Sleep disorders (obstructive sleep apnea). Other risk factors include: Being older than age 34. Having a history of blood clots, stroke, or mini-stroke (transient ischemic attack, TIA). Genetic factors, such as race, ethnicity, or a family history of stroke. Smoking cigarettes or using other tobacco products. Taking birth control pills, especially if you also use tobacco. Heavy use of alcohol or drugs, especially cocaine and methamphetamine. Physical inactivity. What actions can I take to prevent this? Manage your health conditions High cholesterol levels. Eating a healthy diet is important for preventing high cholesterol. If cholesterol cannot be managed through diet alone, you may need to take medicines. Take any prescribed medicines to control your cholesterol as told by your health care provider. Hypertension. To reduce your risk of stroke, try to keep your blood pressure below 130/80. Eating a healthy diet and exercising regularly are important for controlling blood pressure. If these steps are not enough to manage your blood pressure, you may need to take medicines. Take any prescribed medicines to control hypertension as told by your health care provider. Ask your health care provider if you should monitor your blood pressure at home. Have your blood pressure checked every year, even if your blood pressure is normal. Blood pressure increases with age and some medical conditions. Diabetes. Eating a healthy diet and exercising regularly are important parts of managing your blood sugar (glucose). If your blood sugar cannot be managed through diet and exercise, you may need to take medicines. Take any prescribed medicines to control your diabetes as told by your health care provider. Get evaluated for obstructive sleep apnea. Talk to your health care provider about getting a sleep evaluation if you snore a lot or have excessive sleepiness. Make sure that any other  medical conditions you have, such as atrial fibrillation or atherosclerosis, are managed. Nutrition Follow instructions from your health care provider about what to eat or drink to help manage your health condition. These instructions may include: Reducing your daily calorie intake. Limiting how much salt (sodium) you use to 1,500 milligrams (mg) each day. Using only healthy fats for cooking, such as olive oil, canola oil, or sunflower oil. Eating healthy foods. You can do this by: Choosing foods that are high in fiber, such as whole grains, and fresh fruits and vegetables. Eating at least 5 servings of fruits and vegetables a day. Try to fill one-half of your plate with fruits and vegetables at each meal. Choosing lean protein foods, such as lean cuts of meat, poultry without skin, fish, tofu, beans, and nuts. Eating low-fat dairy products. Avoiding foods that are high in sodium. This can help lower blood pressure. Avoiding foods that have saturated fat, trans fat, and cholesterol. This can help prevent high cholesterol. Avoiding processed and prepared foods. Counting your daily carbohydrate intake.  Lifestyle If you drink alcohol: Limit how much you have to: 0-1 drink a day for women who are not pregnant. 0-2 drinks a day for men. Know how much alcohol is in your drink. In the [place] one drink equals one 12 oz bottle of beer (329m), one 5 oz glass of wine (1478m, or one 1 oz glass of hard liquor (4418m Do not use any products that contain nicotine or tobacco. These products include cigarettes, chewing tobacco, and vaping devices, such as e-cigarettes. If you need help quitting, ask your health care provider. Avoid secondhand smoke. Do not use drugs. Activity  Try to stay at a healthy weight. Get at least 30 minutes of exercise on most days, such as: Fast walking. Biking. Swimming. Medicines Take over-the-counter and prescription medicines only as told by your health care  provider. Aspirin or blood thinners (antiplatelets or anticoagulants) may be recommended to reduce your risk of forming blood clots that can lead to stroke. Avoid taking birth control pills. Talk to your health care provider about the risks of taking birth control pills if: You are over 35 62ars old. You smoke. You get very bad headaches. You have had a blood clot. Where to find more information American Stroke Association: www.strokeassociation.org Get help right away if: You or a loved one has any symptoms of a stroke. "BE FAST" is an easy way to remember the main warning signs of a stroke: B - Balance. Signs are dizziness, sudden trouble walking, or loss of balance. E - Eyes. Signs are trouble seeing or a sudden change in vision. F - Face. Signs are sudden weakness or numbness of the face, or the face or eyelid drooping on one side. A - Arms. Signs are weakness or numbness in an arm. This happens suddenly and usually on one side of the body. S - Speech. Signs are sudden trouble speaking, slurred speech, or trouble understanding what people say. T - Time. Time to call emergency services. Write down what time symptoms started. You or a loved one has other signs of a stroke, such as: A sudden, severe headache with no known cause. Nausea or vomiting. Seizure. These symptoms may represent a serious problem that is an emergency. Do not wait to see if the symptoms will go away. Get medical help right away. Call your local emergency services (911 in the [place] Do not drive yourself to the hospital. Summary You can help to prevent a stroke  by eating healthy, exercising, not smoking, limiting alcohol intake, and managing any medical conditions you may have. Do not use any products that contain nicotine or tobacco. These include cigarettes, chewing tobacco, and vaping devices, such as e-cigarettes. If you need help quitting, ask your health care provider. Remember "BE FAST" for warning signs of a  stroke. Get help right away if you or a loved one has any of these signs. This information is not intended to replace advice given to you by your health care provider. Make sure you discuss any questions you have with your health care provider. Document Revised: 01/13/2020 Document Reviewed: 01/13/2020 Elsevier Patient Education  [place].

## 2022-02-04 ENCOUNTER — Ambulatory Visit (INDEPENDENT_AMBULATORY_CARE_PROVIDER_SITE_OTHER): Payer: Medicare Other

## 2022-02-04 ENCOUNTER — Ambulatory Visit (INDEPENDENT_AMBULATORY_CARE_PROVIDER_SITE_OTHER): Payer: Medicare Other | Admitting: Podiatry

## 2022-02-04 DIAGNOSIS — M25872 Other specified joint disorders, left ankle and foot: Secondary | ICD-10-CM

## 2022-02-04 DIAGNOSIS — M778 Other enthesopathies, not elsewhere classified: Secondary | ICD-10-CM

## 2022-02-04 DIAGNOSIS — I639 Cerebral infarction, unspecified: Secondary | ICD-10-CM

## 2022-02-04 DIAGNOSIS — M79671 Pain in right foot: Secondary | ICD-10-CM

## 2022-02-04 DIAGNOSIS — M19079 Primary osteoarthritis, unspecified ankle and foot: Secondary | ICD-10-CM

## 2022-02-04 DIAGNOSIS — M79672 Pain in left foot: Secondary | ICD-10-CM

## 2022-02-04 NOTE — Progress Notes (Unsigned)
Subjective:   Patient ID: [name], female   DOB: 76 y.o.   MRN: [id number]   HPI 76 year old female presents the office today for concerns of bilateral foot pain left side is worse than the right.  She points on the midfoot which seems to majority discomfort but she also gets some pain on the second MPJ.  She changes shoes regularly throughout the day.  It is difficult to find comfortable shoes.  She uses CBD oil which is helpful.  She also feels that she is walking on a bone on the bottom of her left big toe and that she is lost the fat.  No recent injuries.  Review of Systems  All other systems reviewed and are negative.  Past Medical History:  Diagnosis Date   Anxiety    Arthritis    Balance problem    Depression    Double vision 10/2014   bilateral   Dyslipidemia    High blood pressure    Multiple thyroid nodules    OSA (obstructive sleep apnea)    Paroxysmal SVT (supraventricular tachycardia) (HCC)    PVC (premature ventricular contraction)    intermittent   Syncope and collapse    last friday had alot of dizziness    Past Surgical History:  Procedure Laterality Date   ABDOMINAL HYSTERECTOMY     catheter ablation     TONSILLECTOMY       Current Outpatient Medications:    amLODipine (NORVASC) 5 MG tablet, Take 1 tablet (5 mg total) by mouth daily., Disp: 30 tablet, Rfl: 2   aspirin EC 81 MG tablet, Take 1 tablet (81 mg total) by mouth daily. Swallow whole., Disp: 30 tablet, Rfl: 2   ondansetron (ZOFRAN-ODT) 4 MG disintegrating tablet, Take 1 tablet (4 mg total) by mouth every 8 (eight) hours as needed for nausea or vomiting., Disp: 20 tablet, Rfl: 0   rosuvastatin (CRESTOR) 40 MG tablet, Take 1 tablet (40 mg total) by mouth daily., Disp: 30 tablet, Rfl: 2  Allergies  Allergen Reactions   Epinephrine Itching   Contrast Media [Iodinated Contrast Media] Itching    Pt had itchy nose and dry throat and was told she was allergic to IV contrast and does not  want to have it.  PT DENIES HAVING A CONTRAST ALLERGY   Keflex [Cephalexin] Swelling          Objective:  Physical Exam  General: AAO x3, NAD  Dermatological: Skin is warm, dry and supple bilateral.  There are no open sores, no preulcerative lesions, no rash or signs of infection present.  Vascular: Dorsalis Pedis artery and Posterior Tibial artery pedal pulses are 2/4 bilateral with immedate capillary fill time. There is no pain with calf compression, swelling, warmth, erythema.   Musculoskeletal: Decreased medial arch height.  There is tenderness palpation of the Lisfranc joint left side worse than the right.  Tenderness left second MPJ as well.  Tenderness along the left hallux IPJ with bony prominence present.  No edema to this area.  There is no area pinpoint tenderness otherwise.  Slight edema to the feet there is no erythema or associated warmth.  Flexor, extensor tendons appear to be intact.  MMT 5/5.  Neurological: Sensation intact with Semmes Weinstein monofilament.  Gait: Unassisted, Nonantalgic.       Assessment:   Midfoot arthritis, capsulitis     Plan:  -Treatment options discussed including all alternatives, risks, and complications -Etiology of symptoms were discussed -X-rays were obtained and reviewed  with the patient.  3 views of bilateral feet were obtained.  No evidence of acute fracture.  Significant arthritic changes present in the Lisfranc joint left foot worse than right.  Sesamoid noted along the hallux IPJ.  Micah Flesher on discussion regards to treatment options.  Discussed surgery versus conservative care.  Conservative discussed wearing shoes with a stiffer sole and support.  Continue CBD creams but also Voltaren gel if needed.  Consider steroid injection as well.  Dispensed an offloading pad for the left hallux.  Vivi Barrack DPM

## 2022-02-06 ENCOUNTER — Emergency Department (HOSPITAL_COMMUNITY): Payer: Medicare Other

## 2022-02-06 ENCOUNTER — Observation Stay (HOSPITAL_COMMUNITY)
Admission: EM | Admit: 2022-02-06 | Discharge: 2022-02-07 | Disposition: A | Discharge status: Home / Self Care | Payer: Medicare Other | Attending: Internal Medicine | Admitting: Internal Medicine

## 2022-02-06 ENCOUNTER — Encounter (HOSPITAL_COMMUNITY): Payer: Self-pay | Admitting: *Deleted

## 2022-02-06 ENCOUNTER — Other Ambulatory Visit: Payer: Self-pay

## 2022-02-06 DIAGNOSIS — R778 Other specified abnormalities of plasma proteins: Secondary | ICD-10-CM | POA: Diagnosis not present

## 2022-02-06 DIAGNOSIS — Z79899 Other long term (current) drug therapy: Secondary | ICD-10-CM | POA: Diagnosis not present

## 2022-02-06 DIAGNOSIS — R079 Chest pain, unspecified: Secondary | ICD-10-CM | POA: Diagnosis not present

## 2022-02-06 DIAGNOSIS — R0789 Other chest pain: Secondary | ICD-10-CM | POA: Diagnosis present

## 2022-02-06 DIAGNOSIS — I200 Unstable angina: Secondary | ICD-10-CM

## 2022-02-06 DIAGNOSIS — I10 Essential (primary) hypertension: Secondary | ICD-10-CM | POA: Diagnosis present

## 2022-02-06 DIAGNOSIS — I25110 Atherosclerotic heart disease of native coronary artery with unstable angina pectoris: Secondary | ICD-10-CM | POA: Diagnosis not present

## 2022-02-06 DIAGNOSIS — Z87891 Personal history of nicotine dependence: Secondary | ICD-10-CM | POA: Diagnosis not present

## 2022-02-06 DIAGNOSIS — Z7982 Long term (current) use of aspirin: Secondary | ICD-10-CM | POA: Insufficient documentation

## 2022-02-06 DIAGNOSIS — Z8673 Personal history of transient ischemic attack (TIA), and cerebral infarction without residual deficits: Secondary | ICD-10-CM

## 2022-02-06 LAB — CBC
HCT: 39.1 % (ref 36.0–46.0)
Hemoglobin: 13.1 g/dL (ref 12.0–15.0)
MCH: 30.7 pg (ref 26.0–34.0)
MCHC: 33.5 g/dL (ref 30.0–36.0)
MCV: 91.6 fL (ref 80.0–100.0)
Platelets: 246 10*3/uL (ref 150–400)
RBC: 4.27 MIL/uL (ref 3.87–5.11)
RDW: 12.3 % (ref 11.5–15.5)
WBC: 6.5 10*3/uL (ref 4.0–10.5)
nRBC: 0 % (ref 0.0–0.2)

## 2022-02-06 LAB — BASIC METABOLIC PANEL
Anion gap: 8 (ref 5–15)
BUN: 17 mg/dL (ref 8–23)
CO2: 25 mmol/L (ref 22–32)
Calcium: 9.6 mg/dL (ref 8.9–10.3)
Chloride: 108 mmol/L (ref 98–111)
Creatinine, Ser: 1.05 mg/dL — ABNORMAL HIGH (ref 0.44–1.00)
GFR, Estimated: 55 mL/min — ABNORMAL LOW (ref >60)
Glucose, Bld: 98 mg/dL (ref 70–99)
Potassium: 3.5 mmol/L (ref 3.5–5.1)
Sodium: 141 mmol/L (ref 135–145)

## 2022-02-06 LAB — TROPONIN I (HIGH SENSITIVITY): Troponin I (High Sensitivity): 32 ng/L — ABNORMAL HIGH (ref <18)

## 2022-02-06 MED ORDER — NITROGLYCERIN 2 % TD OINT
0.5000 [in_us] | TOPICAL_OINTMENT | Freq: Once | TRANSDERMAL | Status: AC
  Administered 2022-02-06: 0.5 [in_us] via TOPICAL
  Filled 2022-02-06: qty 1

## 2022-02-06 MED ORDER — HEPARIN BOLUS VIA INFUSION
3000.0000 [IU] | Freq: Once | INTRAVENOUS | Status: AC
  Administered 2022-02-06: 3000 [IU] via INTRAVENOUS
  Filled 2022-02-06: qty 3000

## 2022-02-06 MED ORDER — ROSUVASTATIN CALCIUM 20 MG PO TABS
40.0000 mg | ORAL_TABLET | Freq: Every day | ORAL | Status: DC
  Administered 2022-02-06: 40 mg via ORAL
  Filled 2022-02-06: qty 2

## 2022-02-06 MED ORDER — MECLIZINE HCL 25 MG PO TABS: 25.0000 mg | ORAL_TABLET | Freq: Three times a day (TID) | ORAL | Status: DC | PRN

## 2022-02-06 MED ORDER — ACETAMINOPHEN 325 MG PO TABS: 650.0000 mg | ORAL_TABLET | ORAL | Status: DC | PRN

## 2022-02-06 MED ORDER — ASPIRIN 81 MG PO CHEW
324.0000 mg | CHEWABLE_TABLET | Freq: Once | ORAL | Status: AC
  Administered 2022-02-06: 243 mg via ORAL
  Filled 2022-02-06: qty 4

## 2022-02-06 MED ORDER — METOPROLOL SUCCINATE ER 25 MG PO TB24
12.5000 mg | ORAL_TABLET | Freq: Every day | ORAL | Status: DC
  Filled 2022-02-06: qty 1

## 2022-02-06 MED ORDER — ASPIRIN 81 MG PO TBEC: 81.0000 mg | DELAYED_RELEASE_TABLET | Freq: Every day | ORAL | Status: DC

## 2022-02-06 MED ORDER — HEPARIN (PORCINE) 25000 UT/250ML-% IV SOLN
800.0000 [IU]/h | INTRAVENOUS | Status: DC
  Administered 2022-02-06: 800 [IU]/h via INTRAVENOUS
  Filled 2022-02-06: qty 250

## 2022-02-06 MED ORDER — ALPRAZOLAM 0.25 MG PO TABS
0.2500 mg | ORAL_TABLET | Freq: Every evening | ORAL | Status: DC | PRN
Start: 2022-02-06 — End: 2022-02-08

## 2022-02-06 MED ORDER — AMLODIPINE BESYLATE 5 MG PO TABS
5.0000 mg | ORAL_TABLET | Freq: Every day | ORAL | Status: DC
  Administered 2022-02-07: 5 mg via ORAL
  Filled 2022-02-06: qty 1

## 2022-02-06 MED ORDER — ONDANSETRON HCL 4 MG/2ML IJ SOLN: 4.0000 mg | Freq: Four times a day (QID) | INTRAMUSCULAR | Status: DC | PRN

## 2022-02-06 NOTE — H&P (Signed)
History and Physical    [name] [name] DOB: 01/12/1946 DOA: 02/06/2022  PCP: Renford Dills, MD   Patient coming from: Home   Chief Complaint: Chest discomfort   HPI: [name] is a very pleasant 76 y.o. female with medical history significant for hypertension, hyperlipidemia, CVA, Mnire's disease, depression, and anxiety, now presenting to the emergency department with chest discomfort.  Patient reports that she went to bed last night in her usual state but was experiencing a heavy sensation in her chest when she woke this morning.  She has continued to experience what she describes as heaviness in the central chest throughout the course of the day, associated with shortness of breath but no cough, and without diaphoresis.  She often has nausea which she attributes to her Mnire's disease but this is unchanged today.  She reports mild bilateral lower extremity edema since starting amlodipine.  Reports that she used to take metoprolol but had heart rate in the 40s.  ED Course: Upon arrival to the ED, patient is found to be afebrile and saturating well on room air with elevated blood pressure.  EKG features sinus rhythm with PACs and nonspecific ST-T abnormality.  Chest x-ray negative for acute cardiopulmonary disease.  Chemistry panel notable for creatinine 1.05.  CBC unremarkable.  Troponin is 32.  Cardiology (Dr. Welton Flakes) was consulted by the ED physician and the patient was treated with 324 mg of aspirin, nitroglycerin ointment, and started on IV heparin infusion.  Review of Systems:  All other systems reviewed and apart from HPI, are negative.  Past Medical History:  Diagnosis Date   Anxiety    Arthritis    Balance problem    Depression    Double vision 10/2014   bilateral   Dyslipidemia    High blood pressure    Multiple thyroid nodules    OSA (obstructive sleep apnea)    Paroxysmal SVT (supraventricular tachycardia) (HCC)    PVC (premature ventricular  contraction)    intermittent   Syncope and collapse    last friday had alot of dizziness    Past Surgical History:  Procedure Laterality Date   ABDOMINAL HYSTERECTOMY     catheter ablation     TONSILLECTOMY      Social History:   reports that she quit smoking about 37 years ago. Her smoking use included cigarettes. She has never used smokeless tobacco. She reports that she does not drink alcohol and does not use drugs.  Allergies  Allergen Reactions   Epinephrine Itching   Contrast Media [Iodinated Contrast Media] Itching    Pt had itchy nose and dry throat and was told she was allergic to IV contrast and does not want to have it.  PT DENIES HAVING A CONTRAST ALLERGY   Keflex [Cephalexin] Swelling    Family History  Problem Relation Age of Onset   High blood pressure Father    Stroke Father 74   CAD Father 10       CABG.  Lived to be 16   Arthritis Mother    Neuropathy Mother      Prior to Admission medications   Medication Sig Start Date End Date Taking? Authorizing Provider  ALPRAZolam Prudy Feeler) 0.25 MG tablet Take 0.25 mg by mouth at bedtime as needed for anxiety or sleep.   Yes [provider]  amLODipine (NORVASC) 5 MG tablet Take 1 tablet (5 mg total) by mouth daily. 11/18/21  Yes Ghimire, Werner Lean, MD  aspirin EC 81 MG  tablet Take 1 tablet (81 mg total) by mouth daily. Swallow whole. 11/18/21  Yes Ghimire, Werner Lean, MD  meclizine (ANTIVERT) 25 MG tablet Take 25 mg by mouth every 8 (eight) hours as needed for dizziness. 02/03/22  Yes [provider]  ondansetron (ZOFRAN-ODT) 4 MG disintegrating tablet Take 1 tablet (4 mg total) by mouth every 8 (eight) hours as needed for nausea or vomiting. 11/18/21  Yes Ghimire, Werner Lean, MD  rosuvastatin (CRESTOR) 40 MG tablet Take 1 tablet (40 mg total) by mouth daily. 11/19/21  Yes Maretta Bees, MD    Physical Exam: Vitals:   02/06/22 1953 02/06/22 2014 02/06/22 2247 02/06/22 2249  BP: (!) 161/83  (!)  160/79 (!) 160/79  Pulse: 75  76 87  Resp: 16  16 18   Temp: 97.9 F (36.6 C)   98.2 F (36.8 C)  TempSrc: Oral   Oral  SpO2: 100%  98% 100%  Weight:  64 kg    Height:  5\' 1"  (1.549 m)       Constitutional: NAD, no pallor or diaphoresis   Eyes: PERTLA, lids and conjunctivae normal ENMT: Mucous membranes are moist. Posterior pharynx clear of any exudate or lesions.   Neck: supple, no masses  Respiratory:  no wheezing, no crackles. No accessory muscle use.  Cardiovascular: S1 & S2 heard, regular rate and rhythm. No extremity edema.  Abdomen: No distension, no tenderness, soft. Bowel sounds active.  Musculoskeletal: no clubbing / cyanosis. No joint deformity upper and lower extremities.   Skin: no significant rashes, lesions, ulcers. Warm, dry, well-perfused. Neurologic: CN 2-12 grossly intact. Moving all extremities. Alert and oriented.  Psychiatric: Anxious, tearful. Cooperative.    Labs and Imaging on Admission: I have personally reviewed following labs and imaging studies  CBC: Recent Labs  Lab 02/06/22 2033  WBC 6.5  HGB 13.1  HCT 39.1  MCV 91.6  PLT 246   Basic Metabolic Panel: Recent Labs  Lab 02/06/22 2033  NA 141  K 3.5  CL 108  CO2 25  GLUCOSE 98  BUN 17  CREATININE 1.05*  CALCIUM 9.6   GFR: Estimated Creatinine Clearance: 38.5 mL/min (A) (by C-G formula based on SCr of 1.05 mg/dL (H)). Liver Function Tests: No results for input(s): "AST", "ALT", "ALKPHOS", "BILITOT", "PROT", "ALBUMIN" in the last 168 hours. No results for input(s): "LIPASE", "AMYLASE" in the last 168 hours. No results for input(s): "AMMONIA" in the last 168 hours. Coagulation Profile: No results for input(s): "INR", "PROTIME" in the last 168 hours. Cardiac Enzymes: No results for input(s): "CKTOTAL", "CKMB", "CKMBINDEX", "TROPONINI" in the last 168 hours. BNP (last 3 results) No results for input(s): "PROBNP" in the last 8760 hours. HbA1C: No results for input(s): "HGBA1C" in the  last 72 hours. CBG: No results for input(s): "GLUCAP" in the last 168 hours. Lipid Profile: No results for input(s): "CHOL", "HDL", "LDLCALC", "TRIG", "CHOLHDL", "LDLDIRECT" in the last 72 hours. Thyroid Function Tests: No results for input(s): "TSH", "T4TOTAL", "FREET4", "T3FREE", "THYROIDAB" in the last 72 hours. Anemia Panel: No results for input(s): "VITAMINB12", "FOLATE", "FERRITIN", "TIBC", "IRON", "RETICCTPCT" in the last 72 hours. Urine analysis:    Component Value Date/Time   COLORURINE YELLOW 11/16/2021 1600   APPEARANCEUR CLEAR 11/16/2021 1600   LABSPEC 1.012 11/16/2021 1600   PHURINE 6.0 11/16/2021 1600   GLUCOSEU NEGATIVE 11/16/2021 1600   HGBUR NEGATIVE 11/16/2021 1600   BILIRUBINUR NEGATIVE 11/16/2021 1600   KETONESUR 5 (A) 11/16/2021 1600   PROTEINUR NEGATIVE 11/16/2021 1600  UROBILINOGEN 0.2 11/18/2014 0115   NITRITE NEGATIVE 11/16/2021 1600   LEUKOCYTESUR NEGATIVE 11/16/2021 1600   Sepsis Labs: @LABRCNTIP (procalcitonin:4,lacticidven:4) )No results found for this or any previous visit (from the past 240 hour(s)).   Radiological Exams on Admission: DG Chest 2 View  Result Date: 02/06/2022 CLINICAL DATA:  Chest pain, shortness of breath EXAM: CHEST - 2 VIEW COMPARISON:  Previous studies including chest radiograph done on 12/11/2012, CT none on 12/11/2012 and cardiac CT done on 02/01/2022 FINDINGS: Cardiac size is within normal limits. Increase in the AP diameter of chest suggests COPD. Small linear densities in left lower lung fields suggest scarring. There are no signs of pulmonary edema or focal pulmonary consolidation. There is no pleural effusion or pneumothorax. IMPRESSION: There are no signs of pulmonary edema or focal pulmonary consolidation. Electronically Signed   By: 04/03/2022 M.D.   On: 02/06/2022 21:17    EKG: Independently reviewed. Sinus rhythm, PACs, non-specific ST-T abnormalities.   Assessment/Plan   1. Chest pain  - Former smoker with  HTN and HLD presents with 1 day of chest pressure and found to have non-specific ST-T abnormality on EKG and initial troponin of 32  - Cardiology was consulted by ED physician and she was given ASA 324 mg, NTG, and  started on IV heparin  - Continue cardiac monitoring, trend troponin and EKG, continue IV heparin, continue high-intensity statin, follow-up cardiology recommendations   2. 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 Hypertension  - BP elevated in ED, she reports hx of bradycardia with beta-blockers, just received NTG ointment, will see if BP improves with NTG    DVT prophylaxis: IV heparin  Code Status: Full  Level of Care: Level of care: Telemetry Cardiac Family Communication: Husband at bedside  Disposition Plan:  Patient is from: home  Anticipated d/c is to: Home  Anticipated d/c date is: Possibly as early as 02/07/22  Patient currently: Pending cardiology consultation  Consults called: Cardiology  Admission status: Observation     02/09/22, MD [facility]  02/06/2022, 11:56 PM

## 2022-02-06 NOTE — ED Triage Notes (Signed)
Pt states she woke up this am with L sided chest "heaviness" accompanied with fatigue, sob.

## 2022-02-06 NOTE — ED Provider Triage Note (Signed)
Emergency Medicine Provider Triage Evaluation Note  [name] , a 76 y.o. female  was evaluated in triage.  Pt complains of new onset chest pain since this morning.  Started when she was sitting in bed.  Only last for a few seconds at a time, described as sharp.  Not worsened by anything in particular.  Also with some left upper back tenderness, though states this is intermittently chronic.  States she also feels "strange" when she walks, stating she feels "spastic".  Denies new weakness in the lower legs.  No recent fevers or injury.  Review of Systems  Positive:  Negative: See above  Physical Exam  BP (!) 161/83   Pulse 75   Temp 97.9 F (36.6 C) (Oral)   Resp 16   Ht 5\' 1"  (1.549 m)   Wt 64 kg   SpO2 100%   BMI 26.64 kg/m  Gen:   Awake, no distress   Resp:  Normal effort, CTAB MSK:   Moves extremities without difficulty  Other:  Ambulates without difficulty.  No unilateral numbness or weakness in any extremities.  No facial asymmetry.  Gaze aligned appropriately.  PERRLA.  Radial, DP, and PT pulses 2+ bilaterally.  No murmurs appreciated.  Medical Decision Making  Medically screening exam initiated at 8:22 PM.  Appropriate orders placed.  Burnell Matlin Carithers was informed that the remainder of the evaluation will be completed by another provider, this initial triage assessment does not replace that evaluation, and the importance of remaining in the ED until their evaluation is complete.     Lanora Manis, PA-C 02/06/22 2030

## 2022-02-06 NOTE — ED Provider Notes (Signed)
[facility] EMERGENCY DEPARTMENT Provider Note   CSN: [id number] Arrival date & time: 02/06/22  1938     History  Chief Complaint  Patient presents with   Chest Pain    [name] is a 76 y.o. female.   Chest Pain  76 year old female, history of high cholesterol, history of hypertension.  She has no history of coronary disease but did have a stroke several months ago.  She presented to the hospital tonight after waking up this morning having chest pain with some intermittent episodes of shortness of breath.  She has a discomfort in the middle of her upper back which she has not had in the past.  Ever since having the stroke several months ago the patient has not felt quite like herself.  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  She is not nauseated at this time, she is not having any fevers, she denies any swelling.  She was encouraged to come by her neighbor who is a physician here at the hospital    Home Medications Prior to Admission medications   Medication Sig Start Date End Date Taking? Authorizing Provider  amLODipine (NORVASC) 5 MG tablet Take 1 tablet (5 mg total) by mouth daily. 11/18/21   Ghimire, Werner Lean, MD  aspirin EC 81 MG tablet Take 1 tablet (81 mg total) by mouth daily. Swallow whole. 11/18/21   Ghimire, Werner Lean, MD  ondansetron (ZOFRAN-ODT) 4 MG disintegrating tablet Take 1 tablet (4 mg total) by mouth every 8 (eight) hours as needed for nausea or vomiting. 11/18/21   Ghimire, Werner Lean, MD  rosuvastatin (CRESTOR) 40 MG tablet Take 1 tablet (40 mg total) by mouth daily. 11/19/21   Ghimire, Werner Lean, MD      Allergies    Epinephrine, Contrast media [iodinated contrast media], and Keflex [cephalexin]    Review of Systems   Review of Systems  Cardiovascular:  Positive for chest pain.  All other systems reviewed and are negative.   Physical Exam Updated  Vital Signs BP (!) 160/79 (BP Location: Right Arm)   Pulse 87   Temp 98.2 F (36.8 C) (Oral)   Resp 18   Ht 1.549 m (5\' 1" )   Wt 64 kg   SpO2 100%   BMI 26.64 kg/m  Physical Exam Vitals and nursing note reviewed.  Constitutional:      General: She is not in acute distress.    Appearance: She is well-developed.  HENT:     Head: Normocephalic and atraumatic.     Mouth/Throat:     Pharynx: No oropharyngeal exudate.  Eyes:     General: No scleral icterus.       Right eye: No discharge.        Left eye: No discharge.     Conjunctiva/sclera: Conjunctivae normal.     Pupils: Pupils are equal, round, and reactive to light.  Neck:     Thyroid: No thyromegaly.     Vascular: No JVD.  Cardiovascular:     Rate and Rhythm: Normal rate and regular rhythm.     Heart sounds: Normal heart sounds. No murmur heard.    No friction rub. No gallop.  Pulmonary:     Effort: Pulmonary effort is normal. No respiratory distress.     Breath sounds: Normal breath sounds. No wheezing or rales.  Abdominal:     General: Bowel sounds are  normal. There is no distension.     Palpations: Abdomen is soft. There is no mass.     Tenderness: There is no abdominal tenderness.  Musculoskeletal:        General: No tenderness. Normal range of motion.     Cervical back: Normal range of motion and neck supple.  Lymphadenopathy:     Cervical: No cervical adenopathy.  Skin:    General: Skin is warm and dry.     Findings: No erythema or rash.  Neurological:     Mental Status: She is alert.     Coordination: Coordination normal.  Psychiatric:        Behavior: Behavior normal.     ED Results / Procedures / Treatments   Labs (all labs ordered are listed, but only abnormal results are displayed) Labs Reviewed  BASIC METABOLIC PANEL - Abnormal; Notable for the following components:      Result Value   Creatinine, Ser 1.05 (*)    GFR, Estimated 55 (*)    All other components within normal limits  TROPONIN I  (HIGH SENSITIVITY) - Abnormal; Notable for the following components:   Troponin I (High Sensitivity) 32 (*)    All other components within normal limits  CBC  TROPONIN I (HIGH SENSITIVITY)    EKG EKG Interpretation  Date/Time:  Sunday February 06 2022 19:56:51 EDT Ventricular Rate:  73 PR Interval:  136 QRS Duration: 74 QT Interval:  414 QTC Calculation: 456 R Axis:   29 Text Interpretation: Sinus rhythm with Premature atrial complexes ST & T wave abnormality, consider lateral ischemia Abnormal ECG When compared with ECG of 16-Nov-2021 15:15, PREVIOUS ECG IS PRESENT Confirmed by Eber Hong ([id number]) on 02/06/2022 10:38:43 PM  Radiology DG Chest 2 View  Result Date: 02/06/2022 CLINICAL DATA:  Chest pain, shortness of breath EXAM: CHEST - 2 VIEW COMPARISON:  Previous studies including chest radiograph done on 12/11/2012, CT none on 12/11/2012 and cardiac CT done on 02/01/2022 FINDINGS: Cardiac size is within normal limits. Increase in the AP diameter of chest suggests COPD. Small linear densities in left lower lung fields suggest scarring. There are no signs of pulmonary edema or focal pulmonary consolidation. There is no pleural effusion or pneumothorax. IMPRESSION: There are no signs of pulmonary edema or focal pulmonary consolidation. Electronically Signed   By: Ernie Avena M.D.   On: 02/06/2022 21:17    Procedures .Critical Care  Performed by: Eber Hong, MD Authorized by: Eber Hong, MD   Critical care provider statement:    Critical care time (minutes):  30   Critical care time was exclusive of:  Separately billable procedures and treating other patients and teaching time   Critical care was necessary to treat or prevent imminent or life-threatening deterioration of the following conditions:  Cardiac failure   Critical care was time spent personally by me on the following activities:  Development of treatment plan with patient or surrogate, discussions with  consultants, evaluation of patient's response to treatment, examination of patient, ordering and review of laboratory studies, ordering and review of radiographic studies, ordering and performing treatments and interventions, pulse oximetry, re-evaluation of patient's condition, review of old charts and obtaining history from patient or surrogate   I assumed direction of critical care for this patient from another provider in my specialty: no     Care discussed with: admitting provider   Comments:           Medications Ordered in ED Medications  aspirin chewable tablet  324 mg (has no administration in time range)  nitroGLYCERIN (NITROGLYN) 2 % ointment 0.5 inch (has no administration in time range)    ED Course/ Medical Decision Making/ A&P                           Medical Decision Making Amount and/or Complexity of Data Reviewed Labs: ordered. Radiology: ordered.  Risk OTC drugs. Prescription drug management.   This patient presents to the ED for concern of chest pain, this involves an extensive number of treatment options, and is a complaint that carries with it a high risk of complications and morbidity.  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  She is not currently taking any anticoagulants other than a baby aspirin Family doctor is Dr. Nehemiah Settle Cardiologist is Dr. Anne Fu   Lab Tests:  I Ordered, and personally interpreted labs.  The pertinent results  include: Troponin elevated, CBC and metabolic panel unremarkable   Imaging Studies ordered:  I ordered imaging studies including chest x-ray I independently visualized and interpreted imaging which showed no acute findings I agree with the radiologist interpretation   Cardiac Monitoring: / EKG:  The patient was maintained on a cardiac monitor.  I personally viewed and interpreted the cardiac monitored which showed an underlying rhythm of: Normal sinus rhythm, occasional PAC   Consultations Obtained:  I requested consultation with the cardiologist Dr. Lennette Bihari as well as the hospitalist,  and discussed lab and imaging findings as well as pertinent plan - they recommend: Admit to the hospital.  Cardiology will consult, recommends heparin and admission, they will cath tomorrow if there is a large troponin and troponin.  They recommend admission to the hospital service given the recent strength   Problem List / ED Course / Critical interventions / Medication management  Aspirin and nitroglycerin paste I ordered medication including aspirin and nitroglycerin paste for chest pain Reevaluation of the patient after these medicines showed that the patient improved I have reviewed the patients home medicines and have made adjustments as needed   Social Determinants of Health:  High calcium score Non-smoker   Test / Admission - Considered:  Need admission to the hospital, critical care delivered for this patient with unstable angina for an early non-ST elevation MI         Final Clinical Impression(s) / ED Diagnoses Final diagnoses:  Unstable angina [facility])    Rx / DC Orders ED Discharge Orders     None         Eber Hong, MD 02/06/22 2303

## 2022-02-07 ENCOUNTER — Ambulatory Visit (HOSPITAL_COMMUNITY)
Admission: EM | Disposition: A | Discharge status: Home / Self Care | Payer: Self-pay | Source: Home / Self Care | Attending: Emergency Medicine

## 2022-02-07 DIAGNOSIS — I200 Unstable angina: Secondary | ICD-10-CM | POA: Diagnosis not present

## 2022-02-07 DIAGNOSIS — I10 Essential (primary) hypertension: Secondary | ICD-10-CM | POA: Diagnosis not present

## 2022-02-07 DIAGNOSIS — I25110 Atherosclerotic heart disease of native coronary artery with unstable angina pectoris: Secondary | ICD-10-CM | POA: Diagnosis not present

## 2022-02-07 DIAGNOSIS — I2510 Atherosclerotic heart disease of native coronary artery without angina pectoris: Secondary | ICD-10-CM

## 2022-02-07 DIAGNOSIS — R079 Chest pain, unspecified: Secondary | ICD-10-CM | POA: Diagnosis not present

## 2022-02-07 DIAGNOSIS — Z8673 Personal history of transient ischemic attack (TIA), and cerebral infarction without residual deficits: Secondary | ICD-10-CM | POA: Diagnosis not present

## 2022-02-07 HISTORY — PX: LEFT HEART CATH AND CORONARY ANGIOGRAPHY: CATH118249

## 2022-02-07 LAB — CBC WITH DIFFERENTIAL/PLATELET
Abs Immature Granulocytes: 0.02 10*3/uL (ref 0.00–0.07)
Basophils Absolute: 0.1 10*3/uL (ref 0.0–0.1)
Basophils Relative: 1 %
Eosinophils Absolute: 0.2 10*3/uL (ref 0.0–0.5)
Eosinophils Relative: 3 %
HCT: 34.9 % — ABNORMAL LOW (ref 36.0–46.0)
Hemoglobin: 12.1 g/dL (ref 12.0–15.0)
Immature Granulocytes: 0 %
Lymphocytes Relative: 22 %
Lymphs Abs: 1.4 10*3/uL (ref 0.7–4.0)
MCH: 31.1 pg (ref 26.0–34.0)
MCHC: 34.7 g/dL (ref 30.0–36.0)
MCV: 89.7 fL (ref 80.0–100.0)
Monocytes Absolute: 0.7 10*3/uL (ref 0.1–1.0)
Monocytes Relative: 12 %
Neutro Abs: 3.9 10*3/uL (ref 1.7–7.7)
Neutrophils Relative %: 62 %
Platelets: 212 10*3/uL (ref 150–400)
RBC: 3.89 MIL/uL (ref 3.87–5.11)
RDW: 12.2 % (ref 11.5–15.5)
WBC: 6.3 10*3/uL (ref 4.0–10.5)
nRBC: 0 % (ref 0.0–0.2)

## 2022-02-07 LAB — BASIC METABOLIC PANEL
Anion gap: 10 (ref 5–15)
BUN: 15 mg/dL (ref 8–23)
CO2: 23 mmol/L (ref 22–32)
Calcium: 9.3 mg/dL (ref 8.9–10.3)
Chloride: 107 mmol/L (ref 98–111)
Creatinine, Ser: 0.92 mg/dL (ref 0.44–1.00)
GFR, Estimated: >60 mL/min (ref >60)
Glucose, Bld: 107 mg/dL — ABNORMAL HIGH (ref 70–99)
Potassium: 3.4 mmol/L — ABNORMAL LOW (ref 3.5–5.1)
Sodium: 140 mmol/L (ref 135–145)

## 2022-02-07 LAB — TROPONIN I (HIGH SENSITIVITY): Troponin I (High Sensitivity): 32 ng/L — ABNORMAL HIGH (ref <18)

## 2022-02-07 LAB — HEPARIN LEVEL (UNFRACTIONATED): Heparin Unfractionated: 0.43 IU/mL (ref 0.30–0.70)

## 2022-02-07 LAB — MAGNESIUM: Magnesium: 2 mg/dL (ref 1.7–2.4)

## 2022-02-07 SURGERY — LEFT HEART CATH AND CORONARY ANGIOGRAPHY
Anesthesia: LOCAL

## 2022-02-07 MED ORDER — HEPARIN (PORCINE) IN NACL 1000-0.9 UT/500ML-% IV SOLN
INTRAVENOUS | Status: AC
  Filled 2022-02-07: qty 500

## 2022-02-07 MED ORDER — PANTOPRAZOLE SODIUM 40 MG PO TBEC: 40.0000 mg | DELAYED_RELEASE_TABLET | Freq: Every day | ORAL | Status: DC

## 2022-02-07 MED ORDER — HEPARIN SODIUM (PORCINE) 1000 UNIT/ML IJ SOLN
INTRAMUSCULAR | Status: AC
  Filled 2022-02-07: qty 10

## 2022-02-07 MED ORDER — VERAPAMIL HCL 2.5 MG/ML IV SOLN
INTRAVENOUS | Status: DC | PRN
  Administered 2022-02-07: 10 mL via INTRA_ARTERIAL

## 2022-02-07 MED ORDER — SODIUM CHLORIDE 0.9 % WEIGHT BASED INFUSION
3.0000 mL/kg/h | INTRAVENOUS | Status: AC
  Administered 2022-02-07: 3 mL/kg/h via INTRAVENOUS

## 2022-02-07 MED ORDER — METHYLPREDNISOLONE SODIUM SUCC 40 MG IJ SOLR: 40.0000 mg | INTRAMUSCULAR | Status: DC

## 2022-02-07 MED ORDER — MIDAZOLAM HCL 2 MG/2ML IJ SOLN
INTRAMUSCULAR | Status: AC
  Filled 2022-02-07: qty 2

## 2022-02-07 MED ORDER — ASPIRIN 81 MG PO CHEW
81.0000 mg | CHEWABLE_TABLET | ORAL | Status: AC
  Administered 2022-02-07: 81 mg via ORAL
  Filled 2022-02-07: qty 1

## 2022-02-07 MED ORDER — SODIUM CHLORIDE 0.9 % WEIGHT BASED INFUSION
1.0000 mL/kg/h | INTRAVENOUS | Status: DC
  Administered 2022-02-07: 1 mL/kg/h via INTRAVENOUS

## 2022-02-07 MED ORDER — DIPHENHYDRAMINE HCL 25 MG PO CAPS: 50.0000 mg | ORAL_CAPSULE | Freq: Once | ORAL | Status: DC

## 2022-02-07 MED ORDER — ASPIRIN 81 MG PO TBEC: 81.0000 mg | DELAYED_RELEASE_TABLET | Freq: Every day | ORAL | Status: DC

## 2022-02-07 MED ORDER — FENTANYL CITRATE (PF) 100 MCG/2ML IJ SOLN
INTRAMUSCULAR | Status: AC
  Filled 2022-02-07: qty 2

## 2022-02-07 MED ORDER — LIDOCAINE HCL (PF) 1 % IJ SOLN
INTRAMUSCULAR | Status: AC
  Filled 2022-02-07: qty 30

## 2022-02-07 MED ORDER — HEPARIN SODIUM (PORCINE) 1000 UNIT/ML IJ SOLN
INTRAMUSCULAR | Status: DC | PRN
  Administered 2022-02-07: 3000 [IU] via INTRAVENOUS

## 2022-02-07 MED ORDER — LIDOCAINE HCL (PF) 1 % IJ SOLN
INTRAMUSCULAR | Status: DC | PRN
  Administered 2022-02-07: 2 mL

## 2022-02-07 MED ORDER — FENTANYL CITRATE (PF) 100 MCG/2ML IJ SOLN
INTRAMUSCULAR | Status: DC | PRN
Start: 2022-02-07 — End: 2022-02-07
  Administered 2022-02-07: 25 ug via INTRAVENOUS

## 2022-02-07 MED ORDER — SODIUM CHLORIDE 0.9 % IV SOLN: 250.0000 mL | INTRAVENOUS | Status: DC | PRN

## 2022-02-07 MED ORDER — IOHEXOL 350 MG/ML SOLN
INTRAVENOUS | Status: DC | PRN
  Administered 2022-02-07: 40 mL

## 2022-02-07 MED ORDER — HEPARIN (PORCINE) IN NACL 1000-0.9 UT/500ML-% IV SOLN
INTRAVENOUS | Status: DC | PRN
  Administered 2022-02-07 (×2): 500 mL

## 2022-02-07 MED ORDER — HEPARIN (PORCINE) IN NACL 1000-0.9 UT/500ML-% IV SOLN
INTRAVENOUS | Status: AC
  Filled 2022-02-07: qty 1000

## 2022-02-07 MED ORDER — SODIUM CHLORIDE 0.9% FLUSH: 3.0000 mL | Freq: Two times a day (BID) | INTRAVENOUS | Status: DC

## 2022-02-07 MED ORDER — DIPHENHYDRAMINE HCL 50 MG/ML IJ SOLN: 50.0000 mg | Freq: Once | INTRAMUSCULAR | Status: DC

## 2022-02-07 MED ORDER — VERAPAMIL HCL 2.5 MG/ML IV SOLN
INTRAVENOUS | Status: AC
  Filled 2022-02-07: qty 2

## 2022-02-07 MED ORDER — SODIUM CHLORIDE 0.9% FLUSH: 3.0000 mL | INTRAVENOUS | Status: DC | PRN

## 2022-02-07 MED ORDER — MIDAZOLAM HCL 2 MG/2ML IJ SOLN
INTRAMUSCULAR | Status: DC | PRN
  Administered 2022-02-07: 1 mg via INTRAVENOUS

## 2022-02-07 MED ORDER — POTASSIUM CHLORIDE CRYS ER 20 MEQ PO TBCR
40.0000 meq | EXTENDED_RELEASE_TABLET | Freq: Once | ORAL | Status: AC
Start: 2022-02-07 — End: 2022-02-07
  Administered 2022-02-07: 40 meq via ORAL
  Filled 2022-02-07: qty 2

## 2022-02-07 SURGICAL SUPPLY — 9 items
BAND ZEPHYR COMPRESS 30 LONG (HEMOSTASIS) ×1 IMPLANT
CATH 5FR JL3.5 JR4 ANG PIG MP (CATHETERS) ×1 IMPLANT
GLIDESHEATH SLEND SS 6F .021 (SHEATH) ×1 IMPLANT
GUIDEWIRE INQWIRE 1.5J.035X260 (WIRE) IMPLANT
INQWIRE 1.5J .035X260CM (WIRE) ×2
KIT HEART LEFT (KITS) ×2 IMPLANT
PACK CARDIAC CATHETERIZATION (CUSTOM PROCEDURE TRAY) ×2 IMPLANT
TRANSDUCER W/STOPCOCK (MISCELLANEOUS) ×2 IMPLANT
TUBING CIL FLEX 10 FLL-RA (TUBING) ×2 IMPLANT

## 2022-02-07 NOTE — TOC Progression Note (Signed)
Transition of Care [facility]) - Progression Note    Patient Details  Name: [name] MRN: [id number] Date of Birth: 1945/12/09  Transition of Care [facility]) CM/SW Contact  Leone Haven, RN Phone Number: 02/07/2022, 3:43 PM  Clinical Narrative:    From home with spouse, indep.  Presents with chest pain.  TOC following.        Expected Discharge Plan and Services                                                 Social Determinants of Health (SDOH) Interventions    Readmission Risk Interventions     No data to display

## 2022-02-07 NOTE — Discharge Summary (Signed)
Physician Discharge Summary  [name] WFU:[id number] DOB: Nov 02, 1976 DOA: 02/06/2022  PCP: Renford Dills, MD  Admit date: 02/06/2022 Discharge date: 02/07/2022  Admitted From: Home Disposition:  Home  Recommendations for Outpatient Follow-up:  Follow up with PCP in 1-2 weeks Please obtain BMP/CBC in one week  Home Health:NO   Discharge Condition: Stable CODE STATUS:FULL Diet recommendation: Heart Healthy   Brief/Interim Summary:    Chest pain  - cardiology input greatly appreciated, she is with multiple risk factors, and some concerns of cardiac chest pain, EKG abnormalities and troponins on the higher side.  So she was admitted for further work-up, she was kept on heparin GTT overnight, seen by cardiology, she went for a cardiac cath, which was significant for mild nonobstructive CAD, with normal LV function, normal LVEDP, no intervention is needed, recommendation to continue home medication aspirin and statin.  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    Activity: As tolerated with Full fall precautions use walker/cane & assistance as needed   Disposition Home    Diet: Heart Healthy    On your next visit with your primary care physician please Get Medicines reviewed and adjusted.   Please request your Prim.MD to go over all Hospital Tests and  Procedure/Radiological results at the follow up, please get all Hospital records sent to your Prim MD by signing hospital release before you go home.   If you experience worsening of your admission symptoms, develop shortness of breath, life threatening emergency, suicidal or homicidal thoughts you must seek medical attention immediately by calling 911 or calling your MD immediately  if symptoms less severe.  You Must read complete instructions/literature along with all the possible adverse reactions/side effects for all the Medicines you take and that have been prescribed to you. Take any new Medicines after you have completely understood and accpet all the possible adverse reactions/side effects.   Do not drive, operating heavy machinery, perform activities at heights, swimming or participation in water activities or provide baby sitting services if your were admitted for syncope or siezures until you have seen by Primary MD or a Neurologist and advised to do so again.  Do not drive when taking Pain medications.    Do not take more than prescribed Pain, Sleep and Anxiety Medications  Special Instructions: If you have smoked or chewed Tobacco  in the last 2 yrs please stop smoking, stop any regular Alcohol  and or any Recreational drug use.  Wear Seat belts while driving.   Please note  You were cared for by a hospitalist during your hospital stay. If you have any questions about your discharge medications or the care you received while you were in the hospital after you are discharged, you can call the unit and  asked to speak with the hospitalist on call if the hospitalist that took care of you is not available. Once you are discharged, your primary care physician will handle any further medical issues. Please note that NO REFILLS for any discharge medications will be authorized once you are discharged, as it is imperative that you return to your primary care physician (or establish a  relationship with a primary care physician if you do not have one) for your aftercare needs so that they can reassess your need for medications and monitor your lab values.   Increase activity slowly   Complete by: As directed       Allergies as of 02/07/2022       Reactions   Epinephrine Itching   Contrast Media [iodinated Contrast Media] Itching   Pt had itchy nose and dry throat and was told she was allergic to IV contrast and does not want to have it. PT DENIES HAVING A CONTRAST ALLERGY   Keflex [cephalexin] Swelling        Medication List     TAKE these medications    ALPRAZolam 0.25 MG tablet Commonly known as: XANAX Take 0.25 mg by mouth at bedtime as needed for anxiety or sleep.   amLODipine 5 MG tablet Commonly known as: NORVASC Take 1 tablet (5 mg total) by mouth daily.   aspirin EC 81 MG tablet Take 1 tablet (81 mg total) by mouth daily. Swallow whole.   meclizine 25 MG tablet Commonly known as: ANTIVERT Take 25 mg by mouth every 8 (eight) hours as needed for dizziness.   ondansetron 4 MG disintegrating tablet Commonly known as: ZOFRAN-ODT Take 1 tablet (4 mg total) by mouth every 8 (eight) hours as needed for nausea or vomiting.   rosuvastatin 40 MG tablet Commonly known as: CRESTOR Take 1 tablet (40 mg total) by mouth daily.        Allergies  Allergen Reactions   Epinephrine Itching   Contrast Media [Iodinated Contrast Media] Itching    Pt had itchy nose and dry throat and was told she was allergic to IV contrast and does not want to have it.  PT DENIES HAVING A CONTRAST ALLERGY   Keflex [Cephalexin] Swelling    Consultations: CARDIOLOGY   Procedures/Studies: CARDIAC CATHETERIZATION  Result Date: 02/07/2022   Prox LAD to Mid LAD lesion is 35% stenosed.   Ost Cx lesion is 25% stenosed.   Prox RCA to Mid RCA lesion is 20% stenosed.   The left ventricular systolic function is normal.   LV end diastolic pressure is normal.   The left  ventricular ejection fraction is 55-65% by visual estimate. Mild nonobstructive CAD Normal LV function Normal LVEDP Plan: risk factor modification.   DG Chest 2 View  Result Date: 02/06/2022 CLINICAL DATA:  Chest pain, shortness of breath EXAM: CHEST - 2 VIEW COMPARISON:  Previous studies including chest radiograph done on 12/11/2012, CT none on 12/11/2012 and cardiac CT done on 02/01/2022 FINDINGS: Cardiac size is within normal limits. Increase in the AP diameter of chest suggests COPD. Small linear densities in left lower lung fields suggest scarring. There are no signs of pulmonary edema or focal pulmonary consolidation. There is no pleural effusion or pneumothorax. IMPRESSION: There are no signs of pulmonary edema or focal pulmonary consolidation. Electronically Signed   By: Ernie Avena M.D.   On: 02/06/2022 21:17   CT CARDIAC SCORING (SELF PAY ONLY)  Addendum Date: 02/01/2022   ADDENDUM REPORT: 02/01/2022 21:53 CLINICAL DATA:  Cardiovascular Disease Risk stratification EXAM: Coronary Calcium Score TECHNIQUE: A gated, non-contrast computed tomography scan of the heart was performed using 57mm slice thickness. Axial images were analyzed on a dedicated workstation. Calcium scoring of the coronary arteries was performed using the Agatston method. FINDINGS: Coronary arteries: Normal origins. Coronary Calcium Score: Left main: 32 Left anterior descending artery: 51 Left circumflex artery: 233 Right coronary artery: 127 Total: 444 Percentile: 82 Pericardium: Normal. Ascending Aorta: Normal caliber. Non-cardiac: See separate report from [facility]. IMPRESSION: Coronary calcium score of 444. This was 82nd percentile for age-, race-, and sex-matched controls. RECOMMENDATIONS: Coronary artery calcium (CAC) score is a strong predictor of incident coronary heart disease (CHD) and provides predictive information beyond traditional risk factors. CAC scoring is reasonable to use in the decision to  withhold, postpone, or initiate statin therapy in intermediate-risk or selected borderline-risk asymptomatic adults (age 63-75 years and LDL-C >=70 to <190 mg/dL) who do not have diabetes or established atherosclerotic cardiovascular disease (ASCVD).* In intermediate-risk (10-year ASCVD risk >=7.5% to <20%) adults or selected borderline-risk (10-year ASCVD risk >=5% to <7.5%) adults in whom a CAC score is measured for the purpose of making a treatment decision the following recommendations have been made: If CAC=0, it is reasonable to withhold statin therapy and reassess in 5 to 10 years, as long as higher risk conditions are absent (diabetes mellitus, family history of premature CHD in first degree relatives (males <55 years; females <65 years), cigarette smoking, or LDL >=190 mg/dL). If CAC is 1 to 99, it is reasonable to initiate statin therapy for patients >=57 years of age. If CAC is >=100 or >=75th percentile, it is reasonable to initiate statin therapy at any age. Cardiology referral should be considered for patients with CAC scores >=400 or >=75th percentile. *2018 AHA/ACC/AACVPR/AAPA/ABC/ACPM/ADA/AGS/APhA/ASPC/NLA/PCNA Guideline on the Management of Blood Cholesterol: A Report of the American College of Cardiology/American Heart Association Task Force on Clinical Practice Guidelines. J Am Coll Cardiol. 2019;73(24):3168-3209. Epifanio Lesches, MD Electronically Signed   By: Epifanio Lesches M.D.   On: 02/01/2022 21:53   Result Date: 02/01/2022 CLINICAL DATA:  This over-read does not include interpretation of cardiac or coronary anatomy or pathology. The coronary calcium score interpretation by the cardiologist is attached. COMPARISON:  CT chest dated December 11, 2012. FINDINGS: Vascular: There are no significant non-cardiac vascular findings. Mediastinum/Nodes: There are no enlarged lymph nodes.The visualized esophagus demonstrates no significant findings. Lungs/Pleura: Clear lungs. No pneumothorax or  pleural effusion. Upper abdomen: No acute abnormality. Musculoskeletal/Chest wall: No chest wall abnormality. No acute or significant osseous findings. IMPRESSION: 1. No significant extracardiac findings. Electronically Signed: By: Obie Dredge M.D. On: 02/01/2022 15:53      Subjective:  Reports no chest pain overnight, some insomnia, no chest pain overnight  Discharge Exam:   Vitals:   02/07/22 1530 02/07/22 1600 02/07/22 1638 02/07/22 1700  BP: (!) 150/68 (!) 140/69 139/76 135/63  Pulse: 67 64    Resp: 15 18  18   Temp:      TempSrc:      SpO2: 95% 93%    Weight:      Height:        General: Pt is alert, awake, not in acute distress Cardiovascular: RRR, S1/S2 +, no rubs, no gallops Respiratory: CTA bilaterally, no wheezing, no rhonchi Abdominal: Soft, NT, ND, bowel sounds + Extremities: no edema, no cyanosis    The results of significant diagnostics from this hospitalization (including imaging, microbiology, ancillary and laboratory) are listed below for  reference.     Microbiology: No results found for this or any previous visit (from the past 240 hour(s)).   Labs: BNP (last 3 results) No results for input(s): "BNP" in the last 8760 hours. Basic Metabolic Panel: Recent Labs  Lab 02/06/22 2033 02/07/22 0338  NA 141 140  K 3.5 3.4*  CL 108 107  CO2 25 23  GLUCOSE 98 107*  BUN 17 15  CREATININE 1.05* 0.92  CALCIUM 9.6 9.3  MG  --  2.0   Liver Function Tests: No results for input(s): "AST", "ALT", "ALKPHOS", "BILITOT", "PROT", "ALBUMIN" in the last 168 hours. No results for input(s): "LIPASE", "AMYLASE" in the last 168 hours. No results for input(s): "AMMONIA" in the last 168 hours. CBC: Recent Labs  Lab 02/06/22 2033 02/07/22 0338  WBC 6.5 6.3  NEUTROABS  --  3.9  HGB 13.1 12.1  HCT 39.1 34.9*  MCV 91.6 89.7  PLT 246 212   Cardiac Enzymes: No results for input(s): "CKTOTAL", "CKMB", "CKMBINDEX", "TROPONINI" in the last 168  hours. BNP: Invalid input(s): "POCBNP" CBG: No results for input(s): "GLUCAP" in the last 168 hours. D-Dimer No results for input(s): "DDIMER" in the last 72 hours. Hgb A1c No results for input(s): "HGBA1C" in the last 72 hours. Lipid Profile No results for input(s): "CHOL", "HDL", "LDLCALC", "TRIG", "CHOLHDL", "LDLDIRECT" in the last 72 hours. Thyroid function studies No results for input(s): "TSH", "T4TOTAL", "T3FREE", "THYROIDAB" in the last 72 hours.  Invalid input(s): "FREET3" Anemia work up No results for input(s): "VITAMINB12", "FOLATE", "FERRITIN", "TIBC", "IRON", "RETICCTPCT" in the last 72 hours. Urinalysis    Component Value Date/Time   COLORURINE YELLOW 11/16/2021 1600   APPEARANCEUR CLEAR 11/16/2021 1600   LABSPEC 1.012 11/16/2021 1600   PHURINE 6.0 11/16/2021 1600   GLUCOSEU NEGATIVE 11/16/2021 1600   HGBUR NEGATIVE 11/16/2021 1600   BILIRUBINUR NEGATIVE 11/16/2021 1600   KETONESUR 5 (A) 11/16/2021 1600   PROTEINUR NEGATIVE 11/16/2021 1600   UROBILINOGEN 0.2 11/18/2014 0115   NITRITE NEGATIVE 11/16/2021 1600   LEUKOCYTESUR NEGATIVE 11/16/2021 1600   Sepsis Labs Recent Labs  Lab 02/06/22 2033 02/07/22 0338  WBC 6.5 6.3   Microbiology No results found for this or any previous visit (from the past 240 hour(s)).   Time coordinating discharge: 25 minutes  SIGNED:   Huey Bienenstock, MD  [facility] 02/07/2022, 5:23 PM Pager   If 7PM-7AM, please contact night-coverage [email or web address]

## 2022-02-07 NOTE — ED Notes (Signed)
Pt extremely anxious about hospital admission. Education and reassurance attempted by this RN and MD Opyd. Pt denies wanting medication at this time.

## 2022-02-07 NOTE — Interval H&P Note (Signed)
History and Physical Interval Note:  02/07/2022 1:46 PM  [name]  has presented today for surgery, with the diagnosis of unstable angina.  The various methods of treatment have been discussed with the patient and family. After consideration of risks, benefits and other options for treatment, the patient has consented to  Procedure(s): LEFT HEART CATH AND CORONARY ANGIOGRAPHY (N/A) as a surgical intervention.  The patient's history has been reviewed, patient examined, no change in status, stable for surgery.  I have reviewed the patient's chart and labs.  Questions were answered to the patient's satisfaction.   Cath Lab Visit (complete for each Cath Lab visit)  Clinical Evaluation Leading to the Procedure:   ACS: Yes.    Non-ACS:    Anginal Classification: CCS III  Anti-ischemic medical therapy: Minimal Therapy (1 class of medications)  Non-Invasive Test Results: No non-invasive testing performed  Prior CABG: No previous CABG        [name] [facility] 02/07/2022 1:46 PM

## 2022-02-07 NOTE — Discharge Instructions (Signed)
Follow with Primary MD Renford Dills, MD in 7 days   Get CBC, CMP,  checked  by Primary MD next visit.    Activity: As tolerated with Full fall precautions use walker/cane & assistance as needed   Disposition Home    Diet: Heart Healthy    On your next visit with your primary care physician please Get Medicines reviewed and adjusted.   Please request your Prim.MD to go over all Hospital Tests and Procedure/Radiological results at the follow up, please get all Hospital records sent to your Prim MD by signing hospital release before you go home.   If you experience worsening of your admission symptoms, develop shortness of breath, life threatening emergency, suicidal or homicidal thoughts you must seek medical attention immediately by calling 911 or calling your MD immediately  if symptoms less severe.  You Must read complete instructions/literature along with all the possible adverse reactions/side effects for all the Medicines you take and that have been prescribed to you. Take any new Medicines after you have completely understood and accpet all the possible adverse reactions/side effects.   Do not drive, operating heavy machinery, perform activities at heights, swimming or participation in water activities or provide baby sitting services if your were admitted for syncope or siezures until you have seen by Primary MD or a Neurologist and advised to do so again.  Do not drive when taking Pain medications.    Do not take more than prescribed Pain, Sleep and Anxiety Medications  Special Instructions: If you have smoked or chewed Tobacco  in the last 2 yrs please stop smoking, stop any regular Alcohol  and or any Recreational drug use.  Wear Seat belts while driving.   Please note  You were cared for by a hospitalist during your hospital stay. If you have any questions about your discharge medications or the care you received while you were in the hospital after you are  discharged, you can call the unit and asked to speak with the hospitalist on call if the hospitalist that took care of you is not available. Once you are discharged, your primary care physician will handle any further medical issues. Please note that NO REFILLS for any discharge medications will be authorized once you are discharged, as it is imperative that you return to your primary care physician (or establish a relationship with a primary care physician if you do not have one) for your aftercare needs so that they can reassess your need for medications and monitor your lab values.

## 2022-02-07 NOTE — Care Management Obs Status (Signed)
MEDICARE OBSERVATION STATUS NOTIFICATION   Patient Details  Name: [name] MRN: [id number] Date of Birth: 1945-07-17   Medicare Observation Status Notification Given:  Yes    Leone Haven, RN 02/07/2022, 3:40 PM

## 2022-02-07 NOTE — H&P (View-Only) (Signed)
Cardiology Consultation:   Patient ID: [name] MRN: [id number]; DOB: May 05, 1946  Admit date: 02/06/2022 Date of Consult: 02/07/2022  PCP:  Renford Dills, MD   [facility] Providers Cardiologist:  Donato Schultz, MD        Patient Profile:   [name] is a 76 y.o. female with a hx of  hypertension, hyperlipidemia, recent CVA, Mnire's disease, elevated CAC,  depression, and anxiety who is being seen 02/07/2022 for the evaluation of chest pain at the request of Dr. Antionette Char  History of Present Illness:   [name]  is a 76 y.o. female with a hx of  hypertension, hyperlipidemia, recent CVA, Mnire's disease, elevated CAC,  depression, and anxiety who is being seen 02/07/2022 for the evaluation of chest pain at the request of Dr. Antionette Char. She recent had a stroke and was admitted for that. She has a history of elevated coronary score and was supposed to undergo LHC few years ago however refused at that time and decided for medical management. Now coming with atypical chest pain since yesterday. Heaviness in the central chest throughout the course of the day, associated with shortness of breath but no cough, and without diaphoresis. Also has back pain, some N/V as well which she attributes to other things. In he ED, troponin was 32 hence cardiology was consulted. She was started on IV heparin.    Past Medical History:  Diagnosis Date   Anxiety    Arthritis    Balance problem    Depression    Double vision 10/2014   bilateral   Dyslipidemia    High blood pressure    Multiple thyroid nodules    OSA (obstructive sleep apnea)    Paroxysmal SVT (supraventricular tachycardia) (HCC)    PVC (premature ventricular contraction)    intermittent   Syncope and collapse    last friday had alot of dizziness    Past Surgical History:  Procedure Laterality Date   ABDOMINAL HYSTERECTOMY     catheter ablation     TONSILLECTOMY        Inpatient Medications: Scheduled Meds:   amLODipine  5 mg Oral Daily   aspirin EC  81 mg Oral Daily   rosuvastatin  40 mg Oral Daily   Continuous Infusions:  heparin 800 Units/hr (02/06/22 2357)   PRN Meds: acetaminophen, ALPRAZolam, meclizine, ondansetron (ZOFRAN) IV  Allergies:    Allergies  Allergen Reactions   Epinephrine Itching   Contrast Media [Iodinated Contrast Media] Itching    Pt had itchy nose and dry throat and was told she was allergic to IV contrast and does not want to have it.  PT DENIES HAVING A CONTRAST ALLERGY   Keflex [Cephalexin] Swelling    Social History:   Social History   Socioeconomic History   Marital status: Married    Spouse name: [name]   Number of children: 0   Years of education: 12   Highest education level: Not on file  Occupational History    Employer: RETIRED    Comment: retired  Tobacco Use   Smoking status: Former    Packs/day: 0.00    Years: 20.00    Total pack years: 0.00    Types: Cigarettes    Quit date: 12/11/1984    Years since quitting: 37.1   Smokeless tobacco: Never   Tobacco comments:    Quit 40 years ago  Vaping Use   Vaping Use: Never used  Substance and Sexual Activity   Alcohol use: No  Drug use: No   Sexual activity: Yes    Partners: Male    Comment: married- 52 yrs   Other Topics Concern   Not on file  Social History Narrative   Patient lives at home with [name]). Patient is retired.    Social Determinants of Health   Financial Resource Strain: Not on file  Food Insecurity: Not on file  Transportation Needs: Not on file  Physical Activity: Not on file  Stress: Not on file  Social Connections: Not on file  Intimate Partner Violence: Not on file    Family History:    Family History  Problem Relation Age of Onset   High blood pressure Father    Stroke Father 25   CAD Father 87       CABG.  Lived to be 59   Arthritis Mother    Neuropathy Mother      ROS:  Please see the history of present illness.  All other ROS  reviewed and negative.     Physical Exam/Data:   Vitals:   02/07/22 0304 02/07/22 0413 02/07/22 0445 02/07/22 0530  BP: 133/67 (!) 155/69 135/65 132/74  Pulse: 62 65 65 70  Resp: 12 20 16 16   Temp:  97.8 F (36.6 C)    TempSrc:  Oral    SpO2: 94% 96% 93% 95%  Weight:      Height:       No intake or output data in the 24 hours ending 02/07/22 0558    02/06/2022    8:14 PM 02/02/2022   10:37 AM 12/23/2021    1:25 PM  Last 3 Weights  Weight (lbs) 141 lb 141 lb 143 lb 9.6 oz  Weight (kg) 63.957 kg 63.957 kg 65.137 kg     Body mass index is 26.64 kg/m.  General:  Well nourished, well developed, in no acute distress. Very anxious HEENT: normal Neck: no JVD Vascular: No carotid bruits; Distal pulses 2+ bilaterally Cardiac:  normal S1, S2; RRR; no murmur  Lungs:  clear to auscultation bilaterally, no wheezing, rhonchi or rales  Abd: soft, nontender, no hepatomegaly  Ext: no edema Musculoskeletal:  No deformities, BUE and BLE strength normal and equal Skin: warm and dry  Neuro:  CNs 2-12 intact, no focal abnormalities noted Psych:  Normal affect   EKG:  The EKG was personally reviewed and demonstrates: minor St-t changes. No ST elevation  Laboratory Data:  High Sensitivity Troponin:   Recent Labs  Lab 02/06/22 2033 02/06/22 2344  TROPONINIHS 32* 32*     Chemistry Recent Labs  Lab 02/06/22 2033 02/07/22 0338  NA 141 140  K 3.5 3.4*  CL 108 107  CO2 25 23  GLUCOSE 98 107*  BUN 17 15  CREATININE 1.05* 0.92  CALCIUM 9.6 9.3  MG  --  2.0  GFRNONAA 55* >60  ANIONGAP 8 10    No results for input(s): "PROT", "ALBUMIN", "AST", "ALT", "ALKPHOS", "BILITOT" in the last 168 hours. Lipids No results for input(s): "CHOL", "TRIG", "HDL", "LABVLDL", "LDLCALC", "CHOLHDL" in the last 168 hours.  Hematology Recent Labs  Lab 02/06/22 2033 02/07/22 0338  WBC 6.5 6.3  RBC 4.27 3.89  HGB 13.1 12.1  HCT 39.1 34.9*  MCV 91.6 89.7  MCH 30.7 31.1  MCHC 33.5 34.7  RDW 12.3  12.2  PLT 246 212   Thyroid No results for input(s): "TSH", "FREET4" in the last 168 hours.  BNPNo results for input(s): "BNP", "PROBNP" in the last  168 hours.  DDimer No results for input(s): "DDIMER" in the last 168 hours.   Radiology/Studies:  DG Chest 2 View  Result Date: 02/06/2022 CLINICAL DATA:  Chest pain, shortness of breath EXAM: CHEST - 2 VIEW COMPARISON:  Previous studies including chest radiograph done on 12/11/2012, CT none on 12/11/2012 and cardiac CT done on 02/01/2022 FINDINGS: Cardiac size is within normal limits. Increase in the AP diameter of chest suggests COPD. Small linear densities in left lower lung fields suggest scarring. There are no signs of pulmonary edema or focal pulmonary consolidation. There is no pleural effusion or pneumothorax. IMPRESSION: There are no signs of pulmonary edema or focal pulmonary consolidation. Electronically Signed   By: Ernie Avena M.D.   On: 02/06/2022 21:17     Assessment and Plan:   # Chest pain # Elevated Troponin  -Chest pain is slightly atypical however has significant risk factors former smoker with HTN and HLD -EKG has minor ST changes. Troponin is non-trending at 32 -IV heparin and aspirin loaded in the ED -Will do stress test vs LHC in AM depending on patients preference- she is very anxious. Currently denies any chest pain -Echo in AM -Continue aspirin 81 mg daily -Continue statin -Continue heparin for now -Nitro PRN for pain -Continue to trend EKG and troponin -Telemtery  For questions or updates, please contact [facility] Please consult [email or web address] for contact info under    Signed, Hermelinda Dellen, MD  02/07/2022 5:58 AM   Patient seen, examined. Available data reviewed. Agree with findings, assessment, and plan as outlined by Dr Welton Flakes.  I have reviewed extensive records in the patient's chart and discussed potential diagnostic modalities with her at length.  She is currently chest pain-free on IV  heparin.  She presents with substernal chest discomfort yesterday, prolonged discomfort for several hours.  High-sensitivity troponin is mildly increased with a flat trend at 32.  EKG has nonspecific changes.  Coronary calcium score is greater than 400.  Risk profile includes ischemic stroke in May of this year, hypertension, mixed hyperlipidemia, and known intracranial atherosclerosis.  On my exam, she is alert, oriented, in no distress.  Lungs are clear bilaterally, heart is regular rate and rhythm with no murmur gallop, abdomen is soft and nontender, extremities have no edema, skin is warm and dry.  I reviewed the risks, indications, and alternatives to cardiac catheterization and possible PCI with the patient.  She understands the risks of serious complication such as stroke, vascular injury, major bleeding, myocardial infarction, emergency cardiac surgery, and death, are approximately 1-2 out of 1000 for diagnostic catheterization in approximately 1% for PCI.  The patient is anxious, but agreeable to cardiac catheterization and possible PCI after extensive discussion this morning.  I also reviewed the possibility of noninvasive testing with a gated coronary CTA or nuclear stress test with her.  After a shared decision-making discussion, we elected to proceed with definitive evaluation with catheterization and possible PCI in light of her high risk profile, elevated troponin, and prolonged episode of chest discomfort yesterday.  Tonny Bollman, M.D. 02/07/2022 8:23 AM

## 2022-02-07 NOTE — ED Notes (Cosign Needed)
Patient is a little anxious however refuses meds at this time, instructed patient to let RN know if she changes her mind.

## 2022-02-07 NOTE — Progress Notes (Signed)
PROGRESS NOTE    [name]  JQB:[id number] DOB: 11/08/1945 DOA: 02/06/2022 PCP: Renford Dills, MD   Chief Complaint  Patient presents with   Chest Pain    Brief Narrative:  HPI: [name] is a very pleasant 76 y.o. female with medical history significant for hypertension, hyperlipidemia, CVA, Mnire's disease, depression, and anxiety, now presenting to the emergency department with chest discomfort.  Patient reports that she went to bed last night in her usual state but was experiencing a heavy sensation in her chest when she woke this morning.  She has continued to experience what she describes as heaviness in the central chest throughout the course of the day, associated with shortness of breath but no cough, and without diaphoresis.  She often has nausea which she attributes to her Mnire's disease but this is unchanged today.  She reports mild bilateral lower extremity edema since starting amlodipine.  Reports that she used to take metoprolol but had heart rate in the 40s.   ED Course: Upon arrival to the ED, patient is found to be afebrile and saturating well on room air with elevated blood pressure.  EKG features sinus rhythm with PACs and nonspecific ST-T abnormality.  Chest x-ray negative for acute cardiopulmonary disease.  Chemistry panel notable for creatinine 1.05.  CBC unremarkable.  Troponin is 32.  Cardiology (Dr. Welton Flakes) was consulted by the ED physician and the patient was treated with 324 mg of aspirin, nitroglycerin ointment, and started on IV heparin infusion.  Assessment & Plan:   Principal Problem:   Chest pain Active Problems:   Essential hypertension   History of CVA (cerebrovascular accident)   Chest pain  - cardiology input greatly appreciated, she is with multiple risk factors, and some concerns of cardiac chest pain, EKG abnormalities and troponins on the higher side. -Plan for cardiac cath today. -Continue with heparin gtt. meanwhile, continue  with aspirin, statin.  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  - Continue ASA and statin     Hypertension  - BP elevated in ED, she reports hx of bradycardia with beta-blockers, just received NTG ointment, will see if BP improves with NTG    DVT prophylaxis: Heparin GTT Code Status: Full Family Communication: Husband by phone Disposition:   Status is: Observation    Consultants:  Cardiology  Subjective:  Reports no chest pain overnight, some insomnia, no chest pain overnight  Objective: Vitals:   02/07/22 0802 02/07/22 0812 02/07/22 0816 02/07/22 1104  BP:   (!) 150/73 (!) 145/76  Pulse:  74 74 66  Resp:   18 18  Temp: 97.9 F (36.6 C) (!) 97.5 F (36.4 C) (!) 97.5 F (36.4 C) (!) 97.1 F (36.2 C)  TempSrc:  Oral Oral Oral  SpO2:   97% 97%  Weight:      Height:       No intake or output data in the 24 hours ending 02/07/22 1148 Filed Weights   02/06/22 2014  Weight: 64 kg    Examination:  Awake Alert, Oriented X 3, No new F.N deficits, mildly anxious Symmetrical Chest wall movement, Good air movement bilaterally, CTAB RRR,No Gallops,Rubs or new Murmurs, No Parasternal Heave +ve B.Sounds, Abd Soft, No tenderness, No rebound - guarding or rigidity. No Cyanosis, Clubbing or edema, No new Rash or bruise      Data Reviewed: I have personally reviewed following labs and imaging studies  CBC: Recent Labs  Lab 02/06/22 2033  02/07/22 0338  WBC 6.5 6.3  NEUTROABS  --  3.9  HGB 13.1 12.1  HCT 39.1 34.9*  MCV 91.6 89.7  PLT 246 212    Basic Metabolic Panel: Recent Labs  Lab 02/06/22 2033 02/07/22 0338  NA 141 140  K 3.5 3.4*  CL 108 107  CO2 25 23  GLUCOSE 98 107*  BUN 17 15  CREATININE 1.05* 0.92  CALCIUM 9.6 9.3  MG  --  2.0    GFR: Estimated Creatinine Clearance: 43.9 mL/min (by C-G formula based on SCr of 0.92 mg/dL).  Liver Function Tests: No results for input(s): "AST", "ALT", "ALKPHOS", "BILITOT", "PROT", "ALBUMIN" in the  last 168 hours.  CBG: No results for input(s): "GLUCAP" in the last 168 hours.   No results found for this or any previous visit (from the past 240 hour(s)).       Radiology Studies: DG Chest 2 View  Result Date: 02/06/2022 CLINICAL DATA:  Chest pain, shortness of breath EXAM: CHEST - 2 VIEW COMPARISON:  Previous studies including chest radiograph done on 12/11/2012, CT none on 12/11/2012 and cardiac CT done on 02/01/2022 FINDINGS: Cardiac size is within normal limits. Increase in the AP diameter of chest suggests COPD. Small linear densities in left lower lung fields suggest scarring. There are no signs of pulmonary edema or focal pulmonary consolidation. There is no pleural effusion or pneumothorax. IMPRESSION: There are no signs of pulmonary edema or focal pulmonary consolidation. Electronically Signed   By: Ernie Avena M.D.   On: 02/06/2022 21:17        Scheduled Meds:  amLODipine  5 mg Oral Daily   aspirin  81 mg Oral Pre-Cath   [START ON 02/08/2022] aspirin EC  81 mg Oral Daily   diphenhydrAMINE  50 mg Oral Once   Or   diphenhydrAMINE  50 mg Intravenous Once   methylPREDNISolone (SOLU-MEDROL) injection  40 mg Intravenous Q4H   rosuvastatin  40 mg Oral Daily   sodium chloride flush  3 mL Intravenous Q12H   Continuous Infusions:  sodium chloride     sodium chloride     heparin 800 Units/hr (02/06/22 2357)     LOS: 0 days       Huey Bienenstock, MD [facility]   To contact the attending provider between 7A-7P or the covering provider during after hours 7P-7A, please log into the web site [email or web address] and access using universal [place] password for that web site. If you do not have the password, please call the hospital operator.  02/07/2022, 11:48 AM

## 2022-02-07 NOTE — ED Notes (Signed)
Pt ambulated to BR with steady gait.

## 2022-02-07 NOTE — Progress Notes (Signed)
ANTICOAGULATION CONSULT NOTE - Initial Consult  Pharmacy Consult for Heparin  Indication: chest pain/ACS  Allergies  Allergen Reactions   Epinephrine Itching   Contrast Media [Iodinated Contrast Media] Itching    Pt had itchy nose and dry throat and was told she was allergic to IV contrast and does not want to have it.  PT DENIES HAVING A CONTRAST ALLERGY   Keflex [Cephalexin] Swelling    Patient Measurements: Height: 5\' 1"  (154.9 cm) Weight: 64 kg (141 lb) IBW/kg (Calculated) : 47.8  Vital Signs: Temp: 98.2 F (36.8 C) (08/13 2249) Temp Source: Oral (08/13 2249) BP: 160/79 (08/13 2249) Pulse Rate: 87 (08/13 2249)  Labs: Recent Labs    02/06/22 2033  HGB 13.1  HCT 39.1  PLT 246  CREATININE 1.05*  TROPONINIHS 32*    Estimated Creatinine Clearance: 38.5 mL/min (A) (by C-G formula based on SCr of 1.05 mg/dL (H)).   Medical History: Past Medical History:  Diagnosis Date   Anxiety    Arthritis    Balance problem    Depression    Double vision 10/2014   bilateral   Dyslipidemia    High blood pressure    Multiple thyroid nodules    OSA (obstructive sleep apnea)    Paroxysmal SVT (supraventricular tachycardia) (HCC)    PVC (premature ventricular contraction)    intermittent   Syncope and collapse    last friday had alot of dizziness     Assessment: 76 y/o F with chest discomfort and mildly elevated troponin. Starting heparin. Labs above reviewed. PTA meds reviewed.   Goal of Therapy:  Heparin level 0.3-0.7 units/ml Monitor platelets by anticoagulation protocol: Yes   Plan:  Heparin 3000 units BOLUS Start heparin drip at 800 units/hr 0800 Heparin level Daily CBC/Heparin level Monitor for bleeding  61, PharmD, BCPS Clinical Pharmacist Phone: [phone]

## 2022-02-07 NOTE — Consult Note (Addendum)
Cardiology Consultation:   Patient ID: [name] MRN: [id number]; DOB: May 05, 1946  Admit date: 02/06/2022 Date of Consult: 02/07/2022  PCP:  Renford Dills, MD   [facility] Providers Cardiologist:  Donato Schultz, MD        Patient Profile:   [name] is a 76 y.o. female with a hx of  hypertension, hyperlipidemia, recent CVA, Mnire's disease, elevated CAC,  depression, and anxiety who is being seen 02/07/2022 for the evaluation of chest pain at the request of Dr. Antionette Char  History of Present Illness:   [name]  is a 76 y.o. female with a hx of  hypertension, hyperlipidemia, recent CVA, Mnire's disease, elevated CAC,  depression, and anxiety who is being seen 02/07/2022 for the evaluation of chest pain at the request of Dr. Antionette Char. She recent had a stroke and was admitted for that. She has a history of elevated coronary score and was supposed to undergo LHC few years ago however refused at that time and decided for medical management. Now coming with atypical chest pain since yesterday. Heaviness in the central chest throughout the course of the day, associated with shortness of breath but no cough, and without diaphoresis. Also has back pain, some N/V as well which she attributes to other things. In he ED, troponin was 32 hence cardiology was consulted. She was started on IV heparin.    Past Medical History:  Diagnosis Date   Anxiety    Arthritis    Balance problem    Depression    Double vision 10/2014   bilateral   Dyslipidemia    High blood pressure    Multiple thyroid nodules    OSA (obstructive sleep apnea)    Paroxysmal SVT (supraventricular tachycardia) (HCC)    PVC (premature ventricular contraction)    intermittent   Syncope and collapse    last friday had alot of dizziness    Past Surgical History:  Procedure Laterality Date   ABDOMINAL HYSTERECTOMY     catheter ablation     TONSILLECTOMY        Inpatient Medications: Scheduled Meds:   amLODipine  5 mg Oral Daily   aspirin EC  81 mg Oral Daily   rosuvastatin  40 mg Oral Daily   Continuous Infusions:  heparin 800 Units/hr (02/06/22 2357)   PRN Meds: acetaminophen, ALPRAZolam, meclizine, ondansetron (ZOFRAN) IV  Allergies:    Allergies  Allergen Reactions   Epinephrine Itching   Contrast Media [Iodinated Contrast Media] Itching    Pt had itchy nose and dry throat and was told she was allergic to IV contrast and does not want to have it.  PT DENIES HAVING A CONTRAST ALLERGY   Keflex [Cephalexin] Swelling    Social History:   Social History   Socioeconomic History   Marital status: Married    Spouse name: [name]   Number of children: 0   Years of education: 12   Highest education level: Not on file  Occupational History    Employer: RETIRED    Comment: retired  Tobacco Use   Smoking status: Former    Packs/day: 0.00    Years: 20.00    Total pack years: 0.00    Types: Cigarettes    Quit date: 12/11/1984    Years since quitting: 37.1   Smokeless tobacco: Never   Tobacco comments:    Quit 40 years ago  Vaping Use   Vaping Use: Never used  Substance and Sexual Activity   Alcohol use: No  Drug use: No   Sexual activity: Yes    Partners: Male    Comment: married- 52 yrs   Other Topics Concern   Not on file  Social History Narrative   Patient lives at home with [name]). Patient is retired.    Social Determinants of Health   Financial Resource Strain: Not on file  Food Insecurity: Not on file  Transportation Needs: Not on file  Physical Activity: Not on file  Stress: Not on file  Social Connections: Not on file  Intimate Partner Violence: Not on file    Family History:    Family History  Problem Relation Age of Onset   High blood pressure Father    Stroke Father 25   CAD Father 87       CABG.  Lived to be 59   Arthritis Mother    Neuropathy Mother      ROS:  Please see the history of present illness.  All other ROS  reviewed and negative.     Physical Exam/Data:   Vitals:   02/07/22 0304 02/07/22 0413 02/07/22 0445 02/07/22 0530  BP: 133/67 (!) 155/69 135/65 132/74  Pulse: 62 65 65 70  Resp: 12 20 16 16   Temp:  97.8 F (36.6 C)    TempSrc:  Oral    SpO2: 94% 96% 93% 95%  Weight:      Height:       No intake or output data in the 24 hours ending 02/07/22 0558    02/06/2022    8:14 PM 02/02/2022   10:37 AM 12/23/2021    1:25 PM  Last 3 Weights  Weight (lbs) 141 lb 141 lb 143 lb 9.6 oz  Weight (kg) 63.957 kg 63.957 kg 65.137 kg     Body mass index is 26.64 kg/m.  General:  Well nourished, well developed, in no acute distress. Very anxious HEENT: normal Neck: no JVD Vascular: No carotid bruits; Distal pulses 2+ bilaterally Cardiac:  normal S1, S2; RRR; no murmur  Lungs:  clear to auscultation bilaterally, no wheezing, rhonchi or rales  Abd: soft, nontender, no hepatomegaly  Ext: no edema Musculoskeletal:  No deformities, BUE and BLE strength normal and equal Skin: warm and dry  Neuro:  CNs 2-12 intact, no focal abnormalities noted Psych:  Normal affect   EKG:  The EKG was personally reviewed and demonstrates: minor St-t changes. No ST elevation  Laboratory Data:  High Sensitivity Troponin:   Recent Labs  Lab 02/06/22 2033 02/06/22 2344  TROPONINIHS 32* 32*     Chemistry Recent Labs  Lab 02/06/22 2033 02/07/22 0338  NA 141 140  K 3.5 3.4*  CL 108 107  CO2 25 23  GLUCOSE 98 107*  BUN 17 15  CREATININE 1.05* 0.92  CALCIUM 9.6 9.3  MG  --  2.0  GFRNONAA 55* >60  ANIONGAP 8 10    No results for input(s): "PROT", "ALBUMIN", "AST", "ALT", "ALKPHOS", "BILITOT" in the last 168 hours. Lipids No results for input(s): "CHOL", "TRIG", "HDL", "LABVLDL", "LDLCALC", "CHOLHDL" in the last 168 hours.  Hematology Recent Labs  Lab 02/06/22 2033 02/07/22 0338  WBC 6.5 6.3  RBC 4.27 3.89  HGB 13.1 12.1  HCT 39.1 34.9*  MCV 91.6 89.7  MCH 30.7 31.1  MCHC 33.5 34.7  RDW 12.3  12.2  PLT 246 212   Thyroid No results for input(s): "TSH", "FREET4" in the last 168 hours.  BNPNo results for input(s): "BNP", "PROBNP" in the last  168 hours.  DDimer No results for input(s): "DDIMER" in the last 168 hours.   Radiology/Studies:  DG Chest 2 View  Result Date: 02/06/2022 CLINICAL DATA:  Chest pain, shortness of breath EXAM: CHEST - 2 VIEW COMPARISON:  Previous studies including chest radiograph done on 12/11/2012, CT none on 12/11/2012 and cardiac CT done on 02/01/2022 FINDINGS: Cardiac size is within normal limits. Increase in the AP diameter of chest suggests COPD. Small linear densities in left lower lung fields suggest scarring. There are no signs of pulmonary edema or focal pulmonary consolidation. There is no pleural effusion or pneumothorax. IMPRESSION: There are no signs of pulmonary edema or focal pulmonary consolidation. Electronically Signed   By: Palani  Rathinasamy M.D.   On: 02/06/2022 21:17     Assessment and Plan:   # Chest pain # Elevated Troponin  -Chest pain is slightly atypical however has significant risk factors former smoker with HTN and HLD -EKG has minor ST changes. Troponin is non-trending at 32 -IV heparin and aspirin loaded in the ED -Will do stress test vs LHC in AM depending on patients preference- she is very anxious. Currently denies any chest pain -Echo in AM -Continue aspirin 81 mg daily -Continue statin -Continue heparin for now -Nitro PRN for pain -Continue to trend EKG and troponin -Telemtery  For questions or updates, please contact [facility] Please consult [email or web address] for contact info under    Signed, Muhammad S Khan, MD  02/07/2022 5:58 AM   Patient seen, examined. Available data reviewed. Agree with findings, assessment, and plan as outlined by Dr Khan.  I have reviewed extensive records in the patient's chart and discussed potential diagnostic modalities with her at length.  She is currently chest pain-free on IV  heparin.  She presents with substernal chest discomfort yesterday, prolonged discomfort for several hours.  High-sensitivity troponin is mildly increased with a flat trend at 32.  EKG has nonspecific changes.  Coronary calcium score is greater than 400.  Risk profile includes ischemic stroke in May of this year, hypertension, mixed hyperlipidemia, and known intracranial atherosclerosis.  On my exam, she is alert, oriented, in no distress.  Lungs are clear bilaterally, heart is regular rate and rhythm with no murmur gallop, abdomen is soft and nontender, extremities have no edema, skin is warm and dry.  I reviewed the risks, indications, and alternatives to cardiac catheterization and possible PCI with the patient.  She understands the risks of serious complication such as stroke, vascular injury, major bleeding, myocardial infarction, emergency cardiac surgery, and death, are approximately 1-2 out of 1000 for diagnostic catheterization in approximately 1% for PCI.  The patient is anxious, but agreeable to cardiac catheterization and possible PCI after extensive discussion this morning.  I also reviewed the possibility of noninvasive testing with a gated coronary CTA or nuclear stress test with her.  After a shared decision-making discussion, we elected to proceed with definitive evaluation with catheterization and possible PCI in light of her high risk profile, elevated troponin, and prolonged episode of chest discomfort yesterday.  Arvel Oquinn, M.D. 02/07/2022 8:23 AM  

## 2022-02-07 NOTE — Progress Notes (Signed)
ANTICOAGULATION CONSULT NOTE   Pharmacy Consult for Heparin  Indication: chest pain/ACS  Allergies  Allergen Reactions   Epinephrine Itching   Contrast Media [Iodinated Contrast Media] Itching    Pt had itchy nose and dry throat and was told she was allergic to IV contrast and does not want to have it.  PT DENIES HAVING A CONTRAST ALLERGY   Keflex [Cephalexin] Swelling    Patient Measurements: Height: 5\' 1"  (154.9 cm) Weight: 64 kg (141 lb) IBW/kg (Calculated) : 47.8  Vital Signs: Temp: 97.5 F (36.4 C) (08/14 0816) Temp Source: Oral (08/14 0816) BP: 150/73 (08/14 0816) Pulse Rate: 74 (08/14 0816)  Labs: Recent Labs    02/06/22 2033 02/06/22 2344 02/07/22 0338 02/07/22 0810  HGB 13.1  --  12.1  --   HCT 39.1  --  34.9*  --   PLT 246  --  212  --   HEPARINUNFRC  --   --   --  0.43  CREATININE 1.05*  --  0.92  --   TROPONINIHS 32* 32*  --   --      Estimated Creatinine Clearance: 43.9 mL/min (by C-G formula based on SCr of 0.92 mg/dL).   Medical History: Past Medical History:  Diagnosis Date   Anxiety    Arthritis    Balance problem    Depression    Double vision 10/2014   bilateral   Dyslipidemia    High blood pressure    Multiple thyroid nodules    OSA (obstructive sleep apnea)    Paroxysmal SVT (supraventricular tachycardia) (HCC)    PVC (premature ventricular contraction)    intermittent   Syncope and collapse    last friday had alot of dizziness     Assessment: 76 y/o F with chest discomfort and mildly elevated troponin. Plan is for cath today. Pharmacy dosing heparin -heparin level at goal, CBC stable  Goal of Therapy:  Heparin level 0.3-0.7 units/ml Monitor platelets by anticoagulation protocol: Yes   Plan:  -Continue heparin at 800 units/hr -Will follow plans post cath  61, PharmD Clinical Pharmacist **Pharmacist phone directory can now be found on [email or web address] ([email or web address]).  Listed under [facility].

## 2022-02-08 ENCOUNTER — Encounter (HOSPITAL_COMMUNITY): Payer: Self-pay | Admitting: Cardiology

## 2022-02-08 LAB — LIPOPROTEIN A (LPA): Lipoprotein (a): 228.9 nmol/L — ABNORMAL HIGH (ref <75.0)

## 2022-02-08 MED FILL — Heparin Sod (Porcine)-NaCl IV Soln 1000 Unit/500ML-0.9%: INTRAVENOUS | Qty: 500 | Status: AC

## 2022-02-11 ENCOUNTER — Encounter: Payer: Self-pay | Admitting: Podiatry

## 2022-03-12 NOTE — Progress Notes (Unsigned)
Cardiology Office Note:    Date:  03/16/2022   ID:  [name], DOB Jan 27, 1946, MRN [id number]  PCP:  Seward Carol, MD   [facility] Providers Cardiologist:  Candee Furbish, MD     Referring MD: Seward Carol, MD   Chief Complaint: follow-up cardiac cath  History of Present Illness:    [name] is a pleasant 76 y.o. female with a hx of SVT, TIA, OSA on CPAP, former tobacco abuse, Mnire's disease, elevated coronary artery calcification, depression, anxiety and CVA.   Previously seen and 2016 by Dr. Percival Spanish for evaluation of chest discomfort and palpitations, history of SVT with an apparently failed ablation at [facility] clinic years prior (around 1999). She reestablished care on 06/18/2021 with Dr. Marlou Porch for evaluation of chest burning, SVT. She reported 2 times a month she would feel her heart racing, this was unprovoked.  Symptoms occur most often when lying, feels a burning sensation that radiates up chest wall to throat.  Feels lightheaded at times.  History of Mnire's disease, no attack x5 years.  Prior event monitor in 2014 showed short episodes of PAT lasting up to 10 beats duration, 136 bpm. She was initially advised to undergo CTA for evaluation of coronary anatomy but she called back with concerns for receiving CT contrast due to allergy. Dr. Marlou Porch advised that she undergo CT for calcium scoring and Lexiscan Myoview. Patient was to call back to schedule these.  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  Discharged on aspirin/Plavix x3 weeks, followed by aspirin alone. Outpatient cardiac monitor recommended.    Seen in the office by me on 12/23/21. Reported she is doing well, has some days where she has more energy than others. Rare episodes of palpitations/heart racing. If it is really fast, she may feel a little  "woozy." Home SP ranging from 130s - [id number] with diastolic readings mostly < 80. Ankle swelling since CVA, left worse than right. She denied chest pain, shortness of breath, lower extremity edema, melena, hematuria, hemoptysis, diaphoresis, weakness, presyncope, syncope, orthopnea, and PND. Admits that she does not drink enough water.   Admission 8/13-8/14/23 seen by cardiology for evaluation of chest pain.  Prior history of elevated coronary score with recommendation to undergo LHC, however she refused at that time and decided for medical management. Symptoms were pressure in central chest throughout the course of the day associated with shortness of breath but no cough, no diaphoresis. Symptoms persisted for several hours. Also admitted to some back pain, and N/V which she attributed to other causes. Troponin was elevated at 32. With multiple risk factors and concerning symptoms, she elected to undergo LHC which revealed mild nonobstructive CAD with proximal LAD to mid LAD lesion 35% stenosed, ostial Cx lesion 25% stenosed, proximal RCA to mid RCA lesion 20% stenosed, normal LV EF and normal LVEDP.   Today, she is here with her husband for follow-up.  Reports she is feeling pretty well, with no specific cardiac complaints. Mostly bothered by chronic pain from significant arthritis in hands and feet, followed by rheumatology. Unable to walk for long distances due to pain. Stopped rosuvastatin 2 1/2 weeks ago due to feeling that it worsened arthritis pain. Aspirin has caused worsening symptoms of Mnire's disease, so she stopped it. She denies chest pain and shortness of breath. No lower extremity edema, fatigue, palpitations, melena, hematuria, hemoptysis,  diaphoresis, weakness, presyncope, syncope, orthopnea, and PND.  Past Medical History:  Diagnosis Date   Anxiety    Arthritis    Balance problem    Depression    Double vision 10/2014   bilateral   Dyslipidemia    High blood pressure    Multiple  thyroid nodules    OSA (obstructive sleep apnea)    Paroxysmal SVT (supraventricular tachycardia) (HCC)    PVC (premature ventricular contraction)    intermittent   Syncope and collapse    last friday had alot of dizziness    Past Surgical History:  Procedure Laterality Date   ABDOMINAL HYSTERECTOMY     catheter ablation     LEFT HEART CATH AND CORONARY ANGIOGRAPHY N/A 02/07/2022   Procedure: LEFT HEART CATH AND CORONARY ANGIOGRAPHY;  Surgeon: Martinique, Peter M, MD;  Location: [facility];  Service: Cardiovascular;  Laterality: N/A;   TONSILLECTOMY      Current Medications: Current Meds  Medication Sig   ALPRAZolam (XANAX) 0.25 MG tablet Take 0.25 mg by mouth at bedtime as needed for anxiety or sleep.   amLODipine (NORVASC) 5 MG tablet Take 1 tablet (5 mg total) by mouth daily.   Cholecalciferol (VITAMIN D-1000 MAX ST) 25 MCG (1000 UT) tablet Take by mouth daily.   ondansetron (ZOFRAN-ODT) 4 MG disintegrating tablet Take 1 tablet (4 mg total) by mouth every 8 (eight) hours as needed for nausea or vomiting.   rosuvastatin (CRESTOR) 20 MG tablet Take 1 tablet (20 mg total) by mouth 3 (three) times a week.     Allergies:   Epinephrine, Contrast media [iodinated contrast media], and Keflex [cephalexin]   Social History   Socioeconomic History   Marital status: Married    Spouse name: Antony Haste   Number of children: 0   Years of education: 12   Highest education level: Not on file  Occupational History    Employer: RETIRED    Comment: retired  Tobacco Use   Smoking status: Former    Packs/day: 0.00    Years: 20.00    Total pack years: 0.00    Types: Cigarettes    Quit date: 12/11/1984    Years since quitting: 37.2   Smokeless tobacco: Never   Tobacco comments:    Quit 40 years ago  Vaping Use   Vaping Use: Never used  Substance and Sexual Activity   Alcohol use: No   Drug use: No   Sexual activity: Yes    Partners: Male    Comment: married- 65 yrs   Other Topics  Concern   Not on file  Social History Narrative   Patient lives at home with her husband Antony Haste). Patient is retired.    Social Determinants of Health   Financial Resource Strain: Not on file  Food Insecurity: Not on file  Transportation Needs: Not on file  Physical Activity: Not on file  Stress: Not on file  Social Connections: Not on file     Family History: The patient's family history includes Arthritis in her mother; CAD (age of onset: 61) in her father; High blood pressure in her father; Neuropathy in her mother; Stroke (age of onset: 46) in her father.  ROS:   Please see the history of present illness.    + bilateral foot and hand pain All other systems reviewed and are negative.  Labs/Other Studies Reviewed:    The following studies were reviewed today:  LHC 02/07/22    Prox LAD to Mid LAD lesion  is 35% stenosed.   Ost Cx lesion is 25% stenosed.   Prox RCA to Mid RCA lesion is 20% stenosed.   The left ventricular systolic function is normal.   LV end diastolic pressure is normal.   The left ventricular ejection fraction is 55-65% by visual estimate.   Mild nonobstructive CAD Normal LV function Normal LVEDP   Plan: risk factor modification.  CT Cardiac Score 02/01/22  IMPRESSION: Coronary calcium score of 444. This was 82nd percentile for age-, race-, and sex-matched controls.  Echo 11/17/21  1. Left ventricular ejection fraction, by estimation, is 65 to 70%. The  left ventricle has hyperdynamic function. The left ventricle has no  regional wall motion abnormalities. There is moderate left ventricular  hypertrophy. Left ventricular diastolic  parameters are consistent with Grade I diastolic dysfunction (impaired  relaxation).   2. Right ventricular systolic function is normal. The right ventricular  size is normal. Tricuspid regurgitation signal is inadequate for assessing  PA pressure.   3. The mitral valve is normal in structure. No evidence of mitral  valve  regurgitation. No evidence of mitral stenosis.   4. The aortic valve is tricuspid. Aortic valve regurgitation is mild. No  aortic stenosis is present.   5. The inferior vena cava is normal in size with <50% respiratory  variability, suggesting right atrial pressure of 8 mmHg.    Echo 05/2015  - Left ventricle: The cavity size was normal. Wall thickness was    normal. Systolic function was vigorous. The estimated ejection    fraction was in the range of 65% to 70%. Doppler parameters are    consistent with abnormal left ventricular relaxation (grade 1    diastolic dysfunction).  - Aortic valve: There was trivial regurgitation.   Recent Labs: 11/17/2021: ALT 18 01/19/2022: TSH 2.110 02/07/2022: BUN 15; Creatinine, Ser 0.92; Hemoglobin 12.1; Magnesium 2.0; Platelets 212; Potassium 3.4; Sodium 140  Recent Lipid Panel    Component Value Date/Time   CHOL 125 01/19/2022 1120   TRIG 162 (H) 01/19/2022 1120   HDL 42 01/19/2022 1120   CHOLHDL 3.0 01/19/2022 1120   CHOLHDL 4.4 11/17/2021 0132   VLDL 25 11/17/2021 0132   LDLCALC 56 01/19/2022 1120     Risk Assessment/Calculations:       Physical Exam:    VS:  BP 130/62   Pulse 67   Ht 5' (1.524 m)   Wt 139 lb 9.6 oz (63.3 kg)   SpO2 96%   BMI 27.26 kg/m     Wt Readings from Last 3 Encounters:  03/16/22 139 lb 9.6 oz (63.3 kg)  02/07/22 139 lb (63 kg)  02/02/22 141 lb (64 kg)     GEN:  Well nourished, well developed in no acute distress HEENT: Normal NECK: No JVD; No carotid bruits CARDIAC: RRR, no murmurs, rubs, gallops RESPIRATORY:  Clear to auscultation without rales, wheezing or rhonchi  ABDOMEN: Soft, non-tender, non-distended MUSCULOSKELETAL:  No edema; No deformity. 2+ pedal pulses, equal bilaterally SKIN: Warm and dry NEUROLOGIC:  Alert and oriented x 3 PSYCHIATRIC:  Normal affect   EKG:  EKG is not ordered today  Diagnoses:    1. Coronary artery disease involving native coronary artery of native  heart without angina pectoris   2. Hyperlipidemia LDL goal <70   3. History of stroke   4. Essential hypertension   5. Chronic obstructive pulmonary disease, unspecified COPD type ([place])     Assessment and Plan:     CAD without angina:  Mild nonobstructive CAD by cath 02/07/22. Cath results reviewed in detail with patient and husband and questions answered to their satisfaction.  Advised that from a cardiac perspective, she can discontinue aspirin in the setting of side effect.  LDL goal < 70. Will reduce statin dose and frequency to see if she tolerates. Does not exercise on a consistent basis. Avoidance of saturated fats, sweets, and simple carbohydrates encouraged. Encouraged pedal exerciser for cardiovascular exercise.   Paroxysmal atrial tachycardia: Occasional palpitations. Recent cardiac monitor by PCP revealed occasional episodes of SVT longest of which was 8.4 seconds at 146 bpm, no atrial fibrillation. Previously did not tolerate Inderal. Does not want to take medication. Advised her to notify us if symptoms worsen.   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  BP is well-controlled. Advised her to notify neurology that she has stopped aspirin.    Hypertension: BP is well-controlled today. Reports home BP is well-controlled.  Continue amlodipine.  Hyperlipidemia LDL goal < 70: Elevated lipoprotein a at 228. LDL 56 on 7/26. Did not tolerate high dose rosuvastatin. Will try rosuvastatin 20 mg 3 times per week. Will return for fasting lipid panel in 1-2 days. Favor referral to [facility] for lipid management if LDL remains elevated.    COPD: Reported to patient during hospitalization.  I reviewed chest x-ray from 8//13/23. She stopped smoking 1986.  We will refer her to pulmonology for management.     Disposition: 6 months with Dr. Marlou Porch  Medication Adjustments/Labs and Tests Ordered: Current medicines are reviewed at length with the patient today.  Concerns  regarding medicines are outlined above.  Orders Placed This Encounter  Procedures   Lipid Profile   ALT   Ambulatory referral to Pulmonology   Meds ordered this encounter  Medications   rosuvastatin (CRESTOR) 20 MG tablet    Sig: Take 1 tablet (20 mg total) by mouth 3 (three) times a week.    Dispense:  12 tablet    Refill:  11    Patient Instructions  Medication Instructions:   START Crestor with reduce dose one (1) tablet by mouth ( 20 mg) three times weekly.   *If you need a refill on your cardiac medications before your next appointment, please call your pharmacy*   Lab Work:  Your physician recommends that you return for a FASTING lipid profile/ALT. Thursday, September 21. You can come in on the day of your appointment anytime between 7:30-4:30 fasting from midnight the night before.     If you have labs (blood work) drawn today and your tests are completely normal, you will receive your results only by: [place] (if you have MyChart) OR A paper copy in the mail If you have any lab test that is abnormal or we need to change your treatment, we will call you to review the results.   Testing/Procedures:  None ordered.   Follow-Up: At [facility], you and your health needs are our priority.  As part of our continuing mission to provide you with exceptional heart care, we have created designated Provider Care Teams.  These Care Teams include your primary Cardiologist (physician) and Advanced Practice Providers (APPs -  Physician Assistants and Nurse Practitioners) who all work together to provide you with the care you need, when you need it.  We recommend signing up for the patient portal called "MyChart".  Sign up information is provided on this After Visit Summary.  MyChart is used to connect with patients for  Virtual Visits (Telemedicine).  Patients are able to view lab/test results, encounter notes, upcoming appointments, etc.  Non-urgent messages can  be sent to your provider as well.   To learn more about what you can do with MyChart, go to [email or web address].    Your next appointment:   6 month(s)  The format for your next appointment:   In Person  Provider:   Candee Furbish, MD     Other Instructions  You have been referred to Pulmonology.    Important Information About Sugar         Signed, Emmaline Life, NP  03/16/2022 4:15 PM    [place]

## 2022-03-16 ENCOUNTER — Ambulatory Visit: Payer: Medicare Other | Attending: Nurse Practitioner | Admitting: Nurse Practitioner

## 2022-03-16 ENCOUNTER — Encounter: Payer: Self-pay | Admitting: Nurse Practitioner

## 2022-03-16 VITALS — BP 130/62 | HR 67 | Ht 60.0 in | Wt 139.6 lb

## 2022-03-16 DIAGNOSIS — I10 Essential (primary) hypertension: Secondary | ICD-10-CM | POA: Diagnosis present

## 2022-03-16 DIAGNOSIS — E785 Hyperlipidemia, unspecified: Secondary | ICD-10-CM | POA: Diagnosis present

## 2022-03-16 DIAGNOSIS — I2510 Atherosclerotic heart disease of native coronary artery without angina pectoris: Secondary | ICD-10-CM | POA: Insufficient documentation

## 2022-03-16 DIAGNOSIS — Z8673 Personal history of transient ischemic attack (TIA), and cerebral infarction without residual deficits: Secondary | ICD-10-CM | POA: Insufficient documentation

## 2022-03-16 DIAGNOSIS — J449 Chronic obstructive pulmonary disease, unspecified: Secondary | ICD-10-CM | POA: Insufficient documentation

## 2022-03-16 MED ORDER — ROSUVASTATIN CALCIUM 20 MG PO TABS: 20.0000 mg | ORAL_TABLET | ORAL | 11 refills | Status: DC

## 2022-03-16 NOTE — Patient Instructions (Signed)
Medication Instructions:   START Crestor with reduce dose one (1) tablet by mouth ( 20 mg) three times weekly.   *If you need a refill on your cardiac medications before your next appointment, please call your pharmacy*   Lab Work:  Your physician recommends that you return for a FASTING lipid profile/ALT. Thursday, September 21. You can come in on the day of your appointment anytime between 7:30-4:30 fasting from midnight the night before.     If you have labs (blood work) drawn today and your tests are completely normal, you will receive your results only by: [place] (if you have MyChart) OR A paper copy in the mail If you have any lab test that is abnormal or we need to change your treatment, we will call you to review the results.   Testing/Procedures:  None ordered.   Follow-Up: At [facility], you and your health needs are our priority.  As part of our continuing mission to provide you with exceptional heart care, we have created designated Provider Care Teams.  These Care Teams include your primary Cardiologist (physician) and Advanced Practice Providers (APPs -  Physician Assistants and Nurse Practitioners) who all work together to provide you with the care you need, when you need it.  We recommend signing up for the patient portal called "MyChart".  Sign up information is provided on this After Visit Summary.  MyChart is used to connect with patients for Virtual Visits (Telemedicine).  Patients are able to view lab/test results, encounter notes, upcoming appointments, etc.  Non-urgent messages can be sent to your provider as well.   To learn more about what you can do with MyChart, go to [email or web address].    Your next appointment:   6 month(s)  The format for your next appointment:   In Person  Provider:   Candee Furbish, MD     Other Instructions  You have been referred to Pulmonology.    Important Information About Sugar

## 2022-03-17 ENCOUNTER — Ambulatory Visit: Payer: Medicare Other | Attending: Cardiovascular Disease

## 2022-03-17 DIAGNOSIS — E785 Hyperlipidemia, unspecified: Secondary | ICD-10-CM | POA: Insufficient documentation

## 2022-03-17 DIAGNOSIS — I10 Essential (primary) hypertension: Secondary | ICD-10-CM | POA: Insufficient documentation

## 2022-03-17 DIAGNOSIS — I2510 Atherosclerotic heart disease of native coronary artery without angina pectoris: Secondary | ICD-10-CM | POA: Insufficient documentation

## 2022-03-17 DIAGNOSIS — J449 Chronic obstructive pulmonary disease, unspecified: Secondary | ICD-10-CM | POA: Insufficient documentation

## 2022-03-17 DIAGNOSIS — Z8673 Personal history of transient ischemic attack (TIA), and cerebral infarction without residual deficits: Secondary | ICD-10-CM | POA: Insufficient documentation

## 2022-03-17 LAB — LIPID PANEL
Chol/HDL Ratio: 5.1 ratio — ABNORMAL HIGH (ref 0.0–4.4)
Cholesterol, Total: 230 mg/dL — ABNORMAL HIGH (ref 100–199)
HDL: 45 mg/dL (ref >39)
LDL Chol Calc (NIH): 148 mg/dL — ABNORMAL HIGH (ref 0–99)
Triglycerides: 206 mg/dL — ABNORMAL HIGH (ref 0–149)
VLDL Cholesterol Cal: 37 mg/dL (ref 5–40)

## 2022-03-17 LAB — ALT: ALT: 18 IU/L (ref 0–32)

## 2022-03-18 ENCOUNTER — Other Ambulatory Visit: Payer: Self-pay | Admitting: *Deleted

## 2022-03-18 DIAGNOSIS — E785 Hyperlipidemia, unspecified: Secondary | ICD-10-CM

## 2022-04-11 ENCOUNTER — Ambulatory Visit: Payer: Medicare Other | Admitting: Cardiology

## 2022-05-30 ENCOUNTER — Other Ambulatory Visit: Payer: Medicare Other

## 2022-08-15 ENCOUNTER — Ambulatory Visit: Payer: Medicare Other | Admitting: Neurology

## 2022-09-01 NOTE — Progress Notes (Signed)
[facility] [address] [place] [address] [id number]       OFFICE FOLLOW-UP NOTE  Ms. EARNESTENE ANGELLO Date of Birth:  1945-07-23 Medical Record Number:  [id number]   Primary neurologist: Dr. Leonie Man Reason for visit: Stroke follow-up    HPI:   Update 09/05/2022 JM: Patient returns for stroke follow-up accompanied by her husband.  Has been stable from stroke standpoint without any new stroke/TIA symptoms.  Reports residual mild imbalance, also c/o diffuse joint/muscle pain and left hand fingertip numbness without pain. Can have fluctuation of left hand numbness at times, denies affecting certain fingers or radiating type symptoms. Has been previously seen by rheumatology for osteoarthritis and podiatrist for bilateral foot pain, she believes foot pain contributes to her imbalance and worsens with increased ambulation.  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 She will use hearing aide in left ear with some reduction of tinnitus.  She is no longer taking Crestor as she was concerned this was contributing to diffuse pain, unsure if any improvement after stopping.  She also notes occasional heart palpitations, being followed by cardiology, does have dx of SVT.  Blood pressure today 152/64.  Routinely follows with PCP Dr. Delfina Redwood.    History provided for reference purposes only Initial visit 02/02/2022 Dr. Leonie Man: 77 year old Caucasian lady seen today for initial office follow-up visit following consultation for stroke in May 2023.  She is accompanied by husband.  History is obtained from them, review of electronic medical records and I personally reviewed pertinent available imaging films in PACS.Ms. SHEKINA CORDELL is a 77 y.o. female with history of hypertension, hyperlipidemia, prediabetes, medication  nonadherence, SVT, anxiety/depression, Mnire's disease, remote smoking history presenting with dizziness, left arm weakness, nausea, and headache. Woke up at 2am on 11/16/21 and was dizzy. She was fine on 5/22 when she went to bed. 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 She is still experiences some dizziness, however it is better when she is still. PT recommending outpatient vestibular therapy. 2D echo shows EF 65-70%.  LDL cholesterol was elevated 154 mg percent and hemoglobin A1c was 6.3.  Patient was started on aspirin and Plavix for 3 weeks followed by aspirin alone.  She states she is doing well.  She is finished outpatient physical and occupational therapy which she did for 6 weeks.  She is able to walk quite securely by herself without a cane.  She states her blood pressure is getting better though it does fluctuate a bit.  She recently underwent a coronary calcium score which was quite elevated at 444.  She plans to see cardiologist Dr. Marlou Porch to discuss this and further cardiac workup.  She is tolerating Crestor well without side effects.  She had follow-up lipid profile done a few weeks ago in primary care physician's office which was satisfactory but I do not have the results for review.  She has longstanding history of Mnire's disease and had an attack a month ago.  Reports that she had some transient nausea vomiting dizziness and feels she may be another attack.  She is tolerating aspirin well without bruising or bleeding.  Blood pressure slightly elevated in  office today at 176/76 and came down to 153/77.     ROS:   14 system review of systems is positive for those listed in HPI and all other systems negative  PMH:  Past Medical History:  Diagnosis Date   Anxiety     Arthritis    Balance problem    Depression    Double vision 10/2014   bilateral   Dyslipidemia    High blood pressure    Multiple thyroid nodules    OSA (obstructive sleep apnea)    Paroxysmal SVT (supraventricular tachycardia)    PVC (premature ventricular contraction)    intermittent   Syncope and collapse    last friday had alot of dizziness    Social History:  Social History   Socioeconomic History   Marital status: Married    Spouse name: Antony Haste   Number of children: 0   Years of education: 12   Highest education level: Not on file  Occupational History    Employer: RETIRED    Comment: retired  Tobacco Use   Smoking status: Former    Packs/day: 0.00    Years: 20.00    Total pack years: 0.00    Types: Cigarettes    Quit date: 12/11/1984    Years since quitting: 37.7   Smokeless tobacco: Never   Tobacco comments:    Quit 40 years ago  Vaping Use   Vaping Use: Never used  Substance and Sexual Activity   Alcohol use: No   Drug use: No   Sexual activity: Yes    Partners: Male    Comment: married- 45 yrs   Other Topics Concern   Not on file  Social History Narrative   Patient lives at home with her husband Antony Haste). Patient is retired.    Social Determinants of Health   Financial Resource Strain: Not on file  Food Insecurity: Not on file  Transportation Needs: Not on file  Physical Activity: Not on file  Stress: Not on file  Social Connections: Not on file  Intimate Partner Violence: Not on file    Medications:   Current Outpatient Medications on File Prior to Visit  Medication Sig Dispense Refill   ALPRAZolam (XANAX) 0.25 MG tablet Take 0.25 mg by mouth at bedtime as needed for anxiety or sleep.     amLODipine (NORVASC) 5 MG tablet Take 1 tablet (5 mg total) by mouth daily. 30 tablet 2   Cholecalciferol (VITAMIN D-1000 MAX ST) 25 MCG (1000 UT) tablet Take by mouth daily.     ondansetron (ZOFRAN-ODT) 4 MG disintegrating tablet Take 1 tablet (4 mg  total) by mouth every 8 (eight) hours as needed for nausea or vomiting. 20 tablet 0   rosuvastatin (CRESTOR) 20 MG tablet Take 1 tablet (20 mg total) by mouth 3 (three) times a week. (Patient not taking: Reported on 09/05/2022) 12 tablet 11   No current facility-administered medications on file prior to visit.    Allergies:   Allergies  Allergen Reactions   Epinephrine Itching   Contrast Media [Iodinated Contrast Media] Itching    Pt had itchy nose and dry throat and was told she was allergic to IV contrast and does not want to have it.  PT DENIES HAVING A CONTRAST ALLERGY   Keflex [Cephalexin] Swelling    Physical Exam Today's Vitals   09/05/22 0924  BP: (!) 152/64  Pulse: 69  Weight: 143 lb 12.8 oz (65.2 kg)  Height: 5' (1.524 m)   Body mass index  is 28.08 kg/m.   General: well developed, well nourished pleasant elderly Caucasian lady, seated, in no evident distress Head: head normocephalic and atraumatic.  Neck: supple with no carotid or supraclavicular bruits Cardiovascular: regular rate and rhythm, no murmurs Musculoskeletal: no deformity Skin:  no rash/petichiae Vascular:  Normal pulses all extremities  Neurologic Exam Mental Status: Awake and fully alert. Oriented to place and time. Recent and remote memory intact. Attention span, concentration and fund of knowledge appropriate. Mood and affect appropriate.  Cranial Nerves: Pupils equal, briskly reactive to light. Extraocular movements full without nystagmus. Visual fields full to confrontation. Hearing intact. Facial sensation intact. Face, tongue, palate moves normally and symmetrically.  Motor: Normal bulk and tone. Normal strength in all tested extremity muscles. Sensory.: intact to touch ,pinprick .position and vibratory sensation.  Coordination: Rapid alternating movements normal in all extremities. Finger-to-nose and heel-to-shin performed accurately bilaterally. Gait and Station: Arises from chair without  difficulty. Stance is normal. Gait slightly wide-based but steady.. Able to heel, toe and tandem walk with moderate difficulty, more so limited d/t pain Reflexes: 1+ and symmetric. Toes downgoing.       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  Vascular risk factors of hypertension, hyperlipidemia, coronary artery disease and intracranial atherosclerosis.    -imbalance likely multifactorial from prior stroke and diffuse arthritic pain.  Discussed participating in aquatic therapy at [facility], she wishes to hold off at this time but will call if interested in pursuing in the future. Encouraged her to discuss orthopedic evaluation with PCP for persistent pain -Discussed importance of antithrombotic and cholesterol management especially in setting of intracranial arthrosclerosis.  She was agreeable to trying Zetia 10 mg daily and restart aspirin (a few days later) gradually increasing to daily use for secondary stroke prevention measures.  If still unable to tolerate aspirin (reported this worsened her tinnitus?), can consider trialing Plavix. She has f/u with cardiology next month, if difficulty tolerating Zetia or no improvement of cholesterol at that time, may consider PCSK9 inhibitor -left hand numbness of unknown etiology, as nonprogressive, no pain and does not interfere with daily activity/functioning, will hold off on EMG/NCV for now but if becomes worse, can consider -will check lab work today to look for reversible causes -Continue to follow with PCP/ENT for Mnire's disease, no recent attacks but persistent left ear tinnitus -Ensure close PCP follow-up for aggressive stroke risk factor management including BP goal<130/90 and HLD with LDL goal<70      Doing well from stroke standpoint without further recommendations and risk factors are managed by PCP/cardiology. She may  follow up PRN, as usual for our patients who are strictly being followed for stroke. If any new neurological issues should arise, request PCP place referral for evaluation by one of our neurologists. Thank you.      I spent 38 minutes of face-to-face and non-face-to-face time with patient and husband.  This included previsit chart review, lab review, study review, order entry, electronic health record documentation, patient and husband education and discussion regarding the above and answered all other questions to patient and husband satisfaction  Frann Rider, AGNP-BC  [facility] [address] [place], [place] [address]  Phone [phone] Fax [phone] Note: This document was prepared with digital dictation and possible smart phrase technology. Any transcriptional errors that result from this process are unintentional.

## 2022-09-05 ENCOUNTER — Ambulatory Visit (INDEPENDENT_AMBULATORY_CARE_PROVIDER_SITE_OTHER): Payer: Medicare Other | Admitting: Adult Health

## 2022-09-05 ENCOUNTER — Encounter: Payer: Self-pay | Admitting: Adult Health

## 2022-09-05 ENCOUNTER — Ambulatory Visit: Payer: Medicare Other | Admitting: Adult Health

## 2022-09-05 VITALS — BP 152/64 | HR 69 | Ht 60.0 in | Wt 143.8 lb

## 2022-09-05 DIAGNOSIS — I639 Cerebral infarction, unspecified: Secondary | ICD-10-CM

## 2022-09-05 DIAGNOSIS — M7918 Myalgia, other site: Secondary | ICD-10-CM | POA: Diagnosis not present

## 2022-09-05 DIAGNOSIS — I672 Cerebral atherosclerosis: Secondary | ICD-10-CM

## 2022-09-05 DIAGNOSIS — E538 Deficiency of other specified B group vitamins: Secondary | ICD-10-CM

## 2022-09-05 DIAGNOSIS — M2550 Pain in unspecified joint: Secondary | ICD-10-CM

## 2022-09-05 DIAGNOSIS — G8929 Other chronic pain: Secondary | ICD-10-CM

## 2022-09-05 MED ORDER — ASPIRIN 81 MG PO TBEC: 81.0000 mg | DELAYED_RELEASE_TABLET | Freq: Every day | ORAL | 12 refills | Status: DC

## 2022-09-05 MED ORDER — EZETIMIBE 10 MG PO TABS: 10.0000 mg | ORAL_TABLET | Freq: Every day | ORAL | 5 refills | Status: DC

## 2022-09-05 NOTE — Patient Instructions (Signed)
Recommend trying Zetia '10mg'$  daily for cholesterol management   Starting next week, please try to restart aspirin or let me know if you would like to try Plavix   Can try CoQ '200mg'$  daily over the counter to see if this helps with aches and pains. Magnesium is also a good medication to help with this.   Please let me know if left hand symptoms should worsen over time and we can discuss further work up at that point   Please discuss seeing an orthopedic provider with Dr. Delfina Redwood for further recommendations   Consider doing aquatic therapy for balance - please let me know if you are interested in pursuing in the future   Continue to follow up with PCP regarding blood pressure and cholesterol management  Maintain strict control of hypertension with blood pressure goal below 130/90 and cholesterol with LDL cholesterol (bad cholesterol) goal below 70 mg/dL.   Signs of a Stroke? Follow the BEFAST method:  Balance Watch for a sudden loss of balance, trouble with coordination or vertigo Eyes Is there a sudden loss of vision in one or both eyes? Or double vision?  Face: Ask the person to smile. Does one side of the face droop or is it numb?  Arms: Ask the person to raise both arms. Does one arm drift downward? Is there weakness or numbness of a leg? Speech: Ask the person to repeat a simple phrase. Does the speech sound slurred/strange? Is the person confused ? Time: If you observe any of these signs, call [phone].        Thank you for coming to see [place] at [facility]. I hope we have been able to provide you high quality care today.  You may receive a patient satisfaction survey over the next few weeks. We would appreciate your feedback and comments so that we may continue to improve ourselves and the health of our patients.     Ezetimibe Tablets What is this medication? EZETIMIBE (ez ET i mibe) treats high cholesterol. It works by reducing the amount of cholesterol absorbed  from the food you eat. This decreases the amount of bad cholesterol (such as LDL) in your blood. Changes to diet and exercise are often combined with this medication. This medicine may be used for other purposes; ask your health care provider or pharmacist if you have questions. COMMON BRAND NAME(S): Zetia What should I tell my care team before I take this medication? They need to know if you have any of these conditions: Kidney disease Liver disease Muscle cramps, pain Muscle injury Thyroid disease An unusual or allergic reaction to ezetimibe, other medications, foods, dyes, or preservatives Pregnant or trying to get pregnant Breast-feeding How should I use this medication? Take this medication by mouth. Take it as directed on the prescription label at the same time every day. You can take it with or without food. If it upsets your stomach, take it with food. Keep taking it unless your care team tells you to stop. Take bile acid sequestrants at a different time of day than this medication. Take this medication 2 hours BEFORE or 4 hours AFTER bile acid sequestrants. Talk to your care team about the use of this medication in children. While it may be prescribed for children as young as 10 for selected conditions, precautions do apply. Overdosage: If you think you have taken too much of this medicine contact a poison control center or emergency room at once. NOTE: This medicine is only for  you. Do not share this medicine with others. What if I miss a dose? If you miss a dose, take it as soon as you can. If it is almost time for your next dose, take only that dose. Do not take double or extra doses. What may interact with this medication? Do not take this medication with any of the following: Fenofibrate Gemfibrozil This medication may also interact with the following: Antacids Cyclosporine Herbal medications like red yeast rice Other medications to lower cholesterol or triglycerides This  list may not describe all possible interactions. Give your health care provider a list of all the medicines, herbs, non-prescription drugs, or dietary supplements you use. Also tell them if you smoke, drink alcohol, or use illegal drugs. Some items may interact with your medicine. What should I watch for while using this medication? Visit your care team for regular checks on your progress. Tell your care team if your symptoms do not start to get better or if they get worse. Your care team may tell you to stop taking this medication if you develop muscle problems. If your muscle problems do not go away after stopping this medication, contact your care team. Do not become pregnant while taking this medication. Women should inform their care team if they wish to become pregnant or think they might be pregnant. There is potential for serious harm to an unborn child. Talk to your care team for more information. Do not breast-feed an infant while taking this medication. Taking this medication is only part of a total heart healthy program. Your care team may give you a special diet to follow. Avoid alcohol. Avoid smoking. Ask your care team how much you should exercise. What side effects may I notice from receiving this medication? Side effects that you should report to your doctor or health care provider as soon as possible: Allergic reactions--skin rash, itching or hives, swelling of the face, lips, tongue, or throat Side effects that usually do not require medical attention (report to your doctor or health care provider if they continue or are bothersome): Diarrhea Joint pain This list may not describe all possible side effects. Call your doctor for medical advice about side effects. You may report side effects to FDA at 1-800-FDA-1088. Where should I keep my medication? Keep out of the reach of children and pets. Store at room temperature between 15 and 30 degrees C (59 and 86 degrees F). Protect from  moisture. Get rid of any unused medication after the expiration date. NOTE: This sheet is a summary. It may not cover all possible information. If you have questions about this medicine, talk to your doctor, pharmacist, or health care provider.  2023 Elsevier/Gold Standard (2020-06-17 00:00:00)

## 2022-09-09 ENCOUNTER — Ambulatory Visit: Payer: Medicare Other | Admitting: Cardiology

## 2022-09-10 LAB — B12 AND FOLATE PANEL
Folate: >20 ng/mL (ref >3.0)
Vitamin B-12: 484 pg/mL (ref 232–1245)

## 2022-09-10 LAB — METHYLMALONIC ACID, SERUM: Methylmalonic Acid: 131 nmol/L (ref 0–378)

## 2022-09-10 LAB — MAGNESIUM: Magnesium: 2.1 mg/dL (ref 1.6–2.3)

## 2022-10-14 ENCOUNTER — Ambulatory Visit: Payer: Medicare Other | Admitting: Cardiology

## 2022-10-17 ENCOUNTER — Ambulatory Visit: Payer: Medicare Other | Admitting: Cardiology

## 2022-11-22 ENCOUNTER — Ambulatory Visit: Payer: Medicare Other | Admitting: Cardiology

## 2023-03-31 ENCOUNTER — Ambulatory Visit: Payer: Medicare Other | Admitting: Cardiology

## 2023-06-30 ENCOUNTER — Encounter: Payer: Self-pay | Admitting: Cardiology

## 2023-06-30 ENCOUNTER — Ambulatory Visit: Payer: Medicare Other | Attending: Cardiology | Admitting: Cardiology

## 2023-06-30 VITALS — BP 120/82 | HR 62 | Ht 60.0 in | Wt 146.2 lb

## 2023-06-30 DIAGNOSIS — I4719 Other supraventricular tachycardia: Secondary | ICD-10-CM | POA: Diagnosis present

## 2023-06-30 DIAGNOSIS — I10 Essential (primary) hypertension: Secondary | ICD-10-CM | POA: Diagnosis not present

## 2023-06-30 DIAGNOSIS — E785 Hyperlipidemia, unspecified: Secondary | ICD-10-CM | POA: Insufficient documentation

## 2023-06-30 DIAGNOSIS — I2510 Atherosclerotic heart disease of native coronary artery without angina pectoris: Secondary | ICD-10-CM | POA: Insufficient documentation

## 2023-06-30 NOTE — Progress Notes (Signed)
 Cardiology Office Note:   Date:  06/30/2023  ID:  [name], DOB March 31, 1946, MRN [id number] PCP: Rexanne Ingle, MD  [facility] Providers Cardiologist:  Oneil Parchment, MD    History of Present Illness:    Discussed the use of AI scribe software for clinical note transcription with the patient, who gave verbal consent to proceed.  History of Present Illness   The patient is a 78 year old individual with a complex medical history including supraventricular tachycardia (SVT), transient ischemic attack, obstructive sleep apnea (no longer wearing CPAP), former tobacco use, Meniere's disease, elevated coronary artery calcium  score/mild non-obstructive CAD, depression, anxiety, and stroke. The patient was previously evaluated for chest discomfort and palpitations in 2016 and reestablished care in 2022 for chest burning sensation and SVT. The patient reported experiencing two episodes of racing heartbeat per month, often when lying flat, accompanied by a burning sensation radiating up the chest wall to the throat.  In 2014, an event monitor showed short episodes of paroxysmal atrial tachycardia lasting up to 10 beats in duration. A second heart monitor in 2023 showed an average heart rate of 75 beats per minute with primarily sinus rhythm, one isolated run of ventricular tachycardia lasting four beats, and 14 episodes of SVT. The patient underwent a coronary calcium  CT in August 2023 with a calcium  score of 444.  In August 2023, the patient was admitted for evaluation of chest pain. Given the elevated coronary calcium  score, a left heart catheterization was recommended and performed, revealing mild nonobstructive coronary artery disease with proximal to mid LAD 35% stenosed, ostial circumflex 25% stenosed, and proximal RCA to mid RCA 20% stenosed.  At her last follow up, the patient reported stopping rosuvastatin  due to worsening arthritic pain and aspirin  due to worsening Meniere's disease  symptoms. Over the past year, the patient reports having significant arthritic pain, particularly in the knees and feet, but no significant cardiac symptoms. The patient reported occasional episodes of SVT, feeling lightheaded and as if she might pass out, but these episodes were brief and occurred approximately three times a month, all less than 15 seconds in duration. The patient also reported occasional chest heaviness, but no chest pain.  The patient reports new onset lower extremity swelling since starting amlodipine , which worsens with activity and improves with rest. The patient also reports chronic fatigue and a recent onset of frequent sneezing, particularly at night. The patient has a history of sleep apnea but has not used a CPAP machine for several years.         Today patient denies chest pain, shortness of breath, lower extremity edema, fatigue, palpitations, melena, hematuria, hemoptysis, diaphoresis, weakness, presyncope, syncope, orthopnea, and PND.   Studies Reviewed:    EKG:   EKG Interpretation Date/Time:  Friday June 30 2023 13:39:54 EST Ventricular Rate:  69 PR Interval:  132 QRS Duration:  82 QT Interval:  408 QTC Calculation: 437 R Axis:   -13  Text Interpretation: Normal sinus rhythm Left ventricular hypertrophy with repolarization abnormality ( R in aVL , Cornell product ) When compared with ECG of 07-Feb-2022 00:00, PREVIOUS ECG IS PRESENT Confirmed by Trudy Birmingham [phone]) on 06/30/2023 1:45:20 PM    Cardiac Studies & Procedures   CARDIAC CATHETERIZATION  CARDIAC CATHETERIZATION 02/07/2022  Narrative   Prox LAD to Mid LAD lesion is 35% stenosed.   Ost Cx lesion is 25% stenosed.   Prox RCA to Mid RCA lesion is 20% stenosed.   The left ventricular systolic function is  normal.   LV end diastolic pressure is normal.   The left ventricular ejection fraction is 55-65% by visual estimate.  Mild nonobstructive CAD Normal LV function Normal LVEDP  Plan:  risk factor modification.  Findings Coronary Findings Diagnostic  Dominance: Right  Left Main Vessel was injected. Vessel is normal in caliber. Vessel is angiographically normal.  Left Anterior Descending Prox LAD to Mid LAD lesion is 35% stenosed.  Left Circumflex Ost Cx lesion is 25% stenosed.  First Obtuse Marginal Branch Vessel is small in size.  Second Obtuse Marginal Branch Vessel is large in size.  Right Coronary Artery Prox RCA to Mid RCA lesion is 20% stenosed.  Intervention  No interventions have been documented.    ECHOCARDIOGRAM  ECHOCARDIOGRAM COMPLETE 11/17/2021  Narrative ECHOCARDIOGRAM REPORT    Patient Name:   [name] Date of Exam: 11/17/2021 Medical Rec #:  [id number]         Height:       60.0 in Accession #:    [id number]        Weight:       145.0 lb Date of Birth:  08-18-1945         BSA:          1.628 m Patient Age:    73 years          BP:           168/77 mmHg Patient Gender: F                 HR:           70 bpm. Exam Location:  Inpatient  Procedure: 2D Echo, Cardiac Doppler and Color Doppler  Indications:    TIA  History:        Patient has prior history of Echocardiogram examinations, most recent 06/12/2015. Arrythmias:Tachycardia and PVC, Signs/Symptoms:Syncope; Risk Factors:Dyslipidemia and Hypertension.  Sonographer:    EMMIE DEW RDCS Referring Phys: 24 DAWOOD S ELGERGAWY  IMPRESSIONS   1. Left ventricular ejection fraction, by estimation, is 65 to 70%. The left ventricle has hyperdynamic function. The left ventricle has no regional wall motion abnormalities. There is moderate left ventricular hypertrophy. Left ventricular diastolic parameters are consistent with Grade I diastolic dysfunction (impaired relaxation). 2. Right ventricular systolic function is normal. The right ventricular size is normal. Tricuspid regurgitation signal is inadequate for assessing PA pressure. 3. The mitral valve is normal in  structure. No evidence of mitral valve regurgitation. No evidence of mitral stenosis. 4. The aortic valve is tricuspid. Aortic valve regurgitation is mild. No aortic stenosis is present. 5. The inferior vena cava is normal in size with <50% respiratory variability, suggesting right atrial pressure of 8 mmHg.  FINDINGS Left Ventricle: Left ventricular ejection fraction, by estimation, is 65 to 70%. The left ventricle has hyperdynamic function. The left ventricle has no regional wall motion abnormalities. The left ventricular internal cavity size was normal in size. There is moderate left ventricular hypertrophy. Left ventricular diastolic parameters are consistent with Grade I diastolic dysfunction (impaired relaxation).  Right Ventricle: The right ventricular size is normal. No increase in right ventricular wall thickness. Right ventricular systolic function is normal. Tricuspid regurgitation signal is inadequate for assessing PA pressure.  Left Atrium: Left atrial size was normal in size.  Right Atrium: Right atrial size was normal in size.  Pericardium: Trivial pericardial effusion is present.  Mitral Valve: The mitral valve is normal in structure. No evidence of mitral valve regurgitation. No evidence of  mitral valve stenosis.  Tricuspid Valve: The tricuspid valve is normal in structure. Tricuspid valve regurgitation is not demonstrated.  Aortic Valve: The aortic valve is tricuspid. Aortic valve regurgitation is mild. Aortic regurgitation PHT measures 572 msec. No aortic stenosis is present. Aortic valve mean gradient measures 6.0 mmHg. Aortic valve peak gradient measures 11.6 mmHg. Aortic valve area, by VTI measures 2.03 cm.  Pulmonic Valve: The pulmonic valve was normal in structure. Pulmonic valve regurgitation is trivial.  Aorta: The aortic root is normal in size and structure.  Venous: The inferior vena cava is normal in size with less than 50% respiratory variability, suggesting  right atrial pressure of 8 mmHg.  IAS/Shunts: No atrial level shunt detected by color flow Doppler.   LEFT VENTRICLE PLAX 2D LVIDd:         4.40 cm     Diastology LVIDs:         2.60 cm     LV e' medial:    3.75 cm/s LV PW:         1.20 cm     LV E/e' medial:  21.3 LV IVS:        1.20 cm     LV e' lateral:   6.62 cm/s LVOT diam:     2.00 cm     LV E/e' lateral: 12.1 LV SV:         74 LV SV Index:   45 LVOT Area:     3.14 cm  LV Volumes (MOD) LV vol d, MOD A2C: 46.5 ml LV vol d, MOD A4C: 51.6 ml LV vol s, MOD A2C: 16.6 ml LV vol s, MOD A4C: 15.6 ml LV SV MOD A2C:     29.9 ml LV SV MOD A4C:     51.6 ml LV SV MOD BP:      33.1 ml  RIGHT VENTRICLE RV S prime:     19.60 cm/s TAPSE (M-mode): 2.9 cm  LEFT ATRIUM             Index LA diam:        3.40 cm 2.09 cm/m LA Vol (A2C):   39.2 ml 24.08 ml/m LA Vol (A4C):   31.1 ml 19.10 ml/m LA Biplane Vol: 34.8 ml 21.37 ml/m AORTIC VALVE                     PULMONIC VALVE AV Area (Vmax):    2.14 cm      PV Vmax:          1.04 m/s AV Area (Vmean):   2.05 cm      PV Vmean:         68.000 cm/s AV Area (VTI):     2.03 cm      PV VTI:           0.184 m AV Vmax:           170.00 cm/s   PV Peak grad:     4.4 mmHg AV Vmean:          114.000 cm/s  PV Mean grad:     2.5 mmHg AV VTI:            0.362 m       PR End Diast Vel: 3.10 msec AV Peak Grad:      11.6 mmHg AV Mean Grad:      6.0 mmHg LVOT Vmax:         116.00 cm/s LVOT Vmean:  74.400 cm/s LVOT VTI:          0.234 m LVOT/AV VTI ratio: 0.65 AI PHT:            572 msec  AORTA Ao Asc diam: 3.00 cm  MITRAL VALVE MV Area (PHT): 3.65 cm     SHUNTS MV Decel Time: 208 msec     Systemic VTI:  0.23 m MV E velocity: 79.80 cm/s   Systemic Diam: 2.00 cm MV A velocity: 142.00 cm/s MV E/A ratio:  0.56  Dalton McleanMD Electronically signed by Ezra Kanner Signature Date/Time: 11/17/2021/3:34:20 PM    Final   MONITORS  LONG TERM MONITOR (3-14 DAYS) 12/10/2021  CT  SCANS  CT CARDIAC SCORING (SELF PAY ONLY) 02/01/2022  Addendum 02/01/2022  9:55 PM ADDENDUM REPORT: 02/01/2022 21:53  CLINICAL DATA:  Cardiovascular Disease Risk stratification  EXAM: Coronary Calcium  Score  TECHNIQUE: A gated, non-contrast computed tomography scan of the heart was performed using 3mm slice thickness. Axial images were analyzed on a dedicated workstation. Calcium  scoring of the coronary arteries was performed using the Agatston method.  FINDINGS: Coronary arteries: Normal origins.  Coronary Calcium  Score:  Left main: 32  Left anterior descending artery: 51  Left circumflex artery: 233  Right coronary artery: 127  Total: 444  Percentile: 82  Pericardium: Normal.  Ascending Aorta: Normal caliber.  Non-cardiac: See separate report from [facility].  IMPRESSION: Coronary calcium  score of 444. This was 82nd percentile for age-, race-, and sex-matched controls.  RECOMMENDATIONS: Coronary artery calcium  (CAC) score is a strong predictor of incident coronary heart disease (CHD) and provides predictive information beyond traditional risk factors. CAC scoring is reasonable to use in the decision to withhold, postpone, or initiate statin therapy in intermediate-risk or selected borderline-risk asymptomatic adults (age 30-75 years and LDL-C >=70 to <190 mg/dL) who do not have diabetes or established atherosclerotic cardiovascular disease (ASCVD).* In intermediate-risk (10-year ASCVD risk >=7.5% to <20%) adults or selected borderline-risk (10-year ASCVD risk >=5% to <7.5%) adults in whom a CAC score is measured for the purpose of making a treatment decision the following recommendations have been made:  If CAC=0, it is reasonable to withhold statin therapy and reassess in 5 to 10 years, as long as higher risk conditions are absent (diabetes mellitus, family history of premature CHD in first degree relatives (males <55 years; females <65 years),  cigarette smoking, or LDL >=190 mg/dL).  If CAC is 1 to 99, it is reasonable to initiate statin therapy for patients >=17 years of age.  If CAC is >=100 or >=75th percentile, it is reasonable to initiate statin therapy at any age.  Cardiology referral should be considered for patients with CAC scores >=400 or >=75th percentile.  *2018 AHA/ACC/AACVPR/AAPA/ABC/ACPM/ADA/AGS/APhA/ASPC/NLA/PCNA Guideline on the Management of Blood Cholesterol: A Report of the American College of Cardiology/American Heart Association Task Force on Clinical Practice Guidelines. J Am Coll Cardiol. 2019;73(24):3168-3209.  Lonni Nanas, MD   Electronically Signed By: Lonni Nanas M.D. On: 02/01/2022 21:53  Narrative CLINICAL DATA:  This over-read does not include interpretation of cardiac or coronary anatomy or pathology. The coronary calcium  score interpretation by the cardiologist is attached.  COMPARISON:  CT chest dated December 11, 2012.  FINDINGS: Vascular: There are no significant non-cardiac vascular findings.  Mediastinum/Nodes: There are no enlarged lymph nodes.The visualized esophagus demonstrates no significant findings.  Lungs/Pleura: Clear lungs. No pneumothorax or pleural effusion.  Upper abdomen: No acute abnormality.  Musculoskeletal/Chest wall: No chest wall abnormality. No acute or significant  osseous findings.  IMPRESSION: 1. No significant extracardiac findings.  Electronically Signed: By: Elsie ONEIDA Shoulder M.D. On: 02/01/2022 15:53           Risk Assessment/Calculations:              Physical Exam:   VS:  BP 120/82   Pulse 62   Ht 5' (1.524 m)   Wt 146 lb 3.2 oz (66.3 kg)   SpO2 97%   BMI 28.55 kg/m    Wt Readings from Last 3 Encounters:  06/30/23 146 lb 3.2 oz (66.3 kg)  09/05/22 143 lb 12.8 oz (65.2 kg)  03/16/22 139 lb 9.6 oz (63.3 kg)     Physical Exam Vitals reviewed.  Constitutional:      Appearance: Normal appearance.  HENT:      Head: Normocephalic.     Nose: Nose normal.  Eyes:     Pupils: Pupils are equal, round, and reactive to light.  Cardiovascular:     Rate and Rhythm: Normal rate and regular rhythm.     Pulses: Normal pulses.     Heart sounds: Normal heart sounds. No murmur heard.    No friction rub. No gallop.  Pulmonary:     Effort: Pulmonary effort is normal.     Breath sounds: Normal breath sounds.  Abdominal:     General: Abdomen is flat.  Musculoskeletal:     Right lower leg: No edema.     Left lower leg: No edema.  Skin:    General: Skin is warm and dry.     Capillary Refill: Capillary refill takes less than 2 seconds.  Neurological:     General: No focal deficit present.     Mental Status: She is alert and oriented to person, place, and time.  Psychiatric:        Mood and Affect: Mood normal.        Behavior: Behavior normal.        Thought Content: Thought content normal.        Judgment: Judgment normal.      ASSESSMENT AND PLAN:     Assessment and Plan    Supraventricular Tachycardia (SVT)   Intermittent episodes of SVT, occurring approximately three times a month, lasting 10-15 seconds. Symptoms include lightheadedness and near syncope. Previous ablation in 1999. Current management is conservative due to infrequency and short duration of episodes. Discussed potential use of beta blockers like metoprolol  if symptoms worsen. Patient prefers to avoid additional medication unless frequency or severity increases significantly.   - Monitor SVT symptoms   - Consider low-dose metoprolol  (12.5 mg) if frequency or severity increases   - Check BMP and TSH  Coronary Artery Disease (CAD)   Mild nonobstructive CAD with coronary calcium  score of 444. August 2023 heart catheterization revealed mild stenosis in multiple coronary arteries. No chest pain/angina reported. - Patient remains off ASA due to concern this worsened her Meniere's.  - Continue to monitor for progression of  symptoms    Mixed hyperlipidemia Elevated LDL (150 mg/dL) and triglycerides (166 mg/dL) on January 2024 labs. Discontinued rosuvastatin  due to arthritic pain. Discussed importance of managing cholesterol to reduce cardiovascular risk. Proposed referral to lipid clinic for PCSK9 inhibitors due to statin intolerance. Explained that PCSK9 inhibitors can significantly lower LDL levels, potentially reducing LDL to around 40 mg/dL, with a lower side effect profile compared to statins.   - Refer to lipid clinic for evaluation and initiation of PCSK9 inhibitors   - Encourage Mediterranean diet  low in saturated fats and processed foods    Hypertension   Blood pressure generally well-controlled with amlodipine  5 mg, though occasional elevations noted. Reports intermittent lower extremity edema. Discussed potential use of compression socks to manage edema which is likely related to her Amlodipine .  - Continue amlodipine  5 mg   - Recommend use of compression socks for edema    Obstructive Sleep Apnea (OSA)   OSA, previously on CPAP but discontinued use. Discussed potential correlation with chronic fatigue. Patient not interested in resuming CPAP or exploring other devices at this time.   - Monitor symptoms of fatigue and sleep quality    General Health Maintenance   Patient expressed general malaise and requested routine blood tests. Discussed the importance of regular monitoring given her multiple comorbidities. Patient also reported symptoms suggestive of thyroid  dysfunction, including fatigue and temperature dysregulation.   - Order lipid panel   - Order basic metabolic panel   - Order TSH   - Follow up with PCP strongly recommended  Follow-up   - Follow up in 6 months with cardiology   - Schedule appointment with lipid clinic in the next few weeks.                 Signed, Artist Pouch, PA-C

## 2023-06-30 NOTE — Patient Instructions (Addendum)
 The current medical regimen is effective. Continue present plan and medications as directed. Please refer to the Current Medication list given to you today.    *If you need a refill on your cardiac medications before your next appointment, please call your pharmacy*   Lab Work: Labs will be drawn today  If you have labs (blood work) drawn today and your tests are completely normal, you will receive your results only by: MyChart Message (if you have MyChart) OR A paper copy in the mail If you have any lab test that is abnormal or we need to change your treatment, we will call you to review the results.   Follow-Up: At [facility], you and your health needs are our priority.  As part of our continuing mission to provide you with exceptional heart care, we have created designated Provider Care Teams.  These Care Teams include your primary Cardiologist (physician) and Advanced Practice Providers (APPs -  Physician Assistants and Nurse Practitioners) who all work together to provide you with the care you need, when you need it.  We recommend signing up for the patient portal called MyChart.  Sign up information is provided on this After Visit Summary.  MyChart is used to connect with patients for Virtual Visits (Telemedicine).  Patients are able to view lab/test results, encounter notes, upcoming appointments, etc.  Non-urgent messages can be sent to your provider as well.   To learn more about what you can do with MyChart, go to [email or web address].    Your next appointment:   6 month(s)  Provider:   Oneil Parchment, MD  or Artist Pouch  Other Instructions Thank you for choosing [facility]!

## 2023-07-04 LAB — BASIC METABOLIC PANEL
BUN/Creatinine Ratio: 19 (ref 12–28)
BUN: 19 mg/dL (ref 8–27)
CO2: 21 mmol/L (ref 20–29)
Calcium: 9.9 mg/dL (ref 8.7–10.3)
Chloride: 105 mmol/L (ref 96–106)
Creatinine, Ser: 1.01 mg/dL — ABNORMAL HIGH (ref 0.57–1.00)
Glucose: 95 mg/dL (ref 70–99)
Potassium: 4.3 mmol/L (ref 3.5–5.2)
Sodium: 143 mmol/L (ref 134–144)
eGFR: 57 mL/min/{1.73_m2} — ABNORMAL LOW (ref >59)

## 2023-07-04 LAB — TSH: TSH: 2.47 u[IU]/mL (ref 0.450–4.500)

## 2023-07-04 LAB — LIPID PANEL
Chol/HDL Ratio: 5.8 {ratio} — ABNORMAL HIGH (ref 0.0–4.4)
Cholesterol, Total: 262 mg/dL — ABNORMAL HIGH (ref 100–199)
HDL: 45 mg/dL (ref >39)
LDL Chol Calc (NIH): 177 mg/dL — ABNORMAL HIGH (ref 0–99)
Triglycerides: 214 mg/dL — ABNORMAL HIGH (ref 0–149)
VLDL Cholesterol Cal: 40 mg/dL (ref 5–40)

## 2023-08-08 ENCOUNTER — Other Ambulatory Visit: Payer: Self-pay | Admitting: Internal Medicine

## 2023-08-08 DIAGNOSIS — N63 Unspecified lump in unspecified breast: Secondary | ICD-10-CM

## 2023-08-15 ENCOUNTER — Ambulatory Visit: Payer: Medicare Other | Attending: Cardiology | Admitting: Pharmacist

## 2023-08-15 ENCOUNTER — Other Ambulatory Visit (HOSPITAL_COMMUNITY): Payer: Self-pay

## 2023-08-15 ENCOUNTER — Telehealth: Payer: Self-pay | Admitting: Pharmacy Technician

## 2023-08-15 DIAGNOSIS — E782 Mixed hyperlipidemia: Secondary | ICD-10-CM | POA: Insufficient documentation

## 2023-08-15 NOTE — Assessment & Plan Note (Signed)
Assessment: LDL cholesterol is above goal of less than 55 due to her history of CVA, hypertension age CKD Intolerant to atorvastatin and rosuvastatin including rosuvastatin 3 times a week Patient currently on no lipid lowering medications We discussed PCSK9 in detail including side effects injection technique and cost Patient with [employer] insurance therefore cost is affordable  Plan: Submit prior authorization for Repatha Labs in 3 months including ApoB

## 2023-08-15 NOTE — Telephone Encounter (Signed)
Pharmacy Patient Advocate Encounter  Received notification from [employer] that Prior Authorization for repatha has been APPROVED from 07/16/23 to 06/26/2098. Ran test claim, Copay is $43.00. This test claim was processed through [facility]- copay amounts may vary at other pharmacies due to pharmacy/plan contracts, or as the patient moves through the different stages of their insurance plan.   PA #/Case ID/Reference #: [id number]

## 2023-08-15 NOTE — Progress Notes (Signed)
Patient ID: [name]                 DOB: [birth year]                    MRN: [id number]      HPI: [name] is a 78 y.o. female patient referred to lipid clinic by Perlie Gold, PA. PMH is significant for supraventricular tachycardia (SVT), transient ischemic attack, obstructive sleep apnea (no longer wearing CPAP), former tobacco use, Meniere's disease, elevated coronary artery calcium score/mild non-obstructive CAD, depression, anxiety, and stroke. The patient underwent a coronary calcium CT in August 2023 with a calcium score of 444. In August 2023, the patient was admitted for evaluation of chest pain. Given the elevated coronary calcium score, a left heart catheterization was recommended and performed, revealing mild nonobstructive coronary artery disease with proximal to mid LAD 35% stenosed, ostial circumflex 25% stenosed, and proximal RCA to mid RCA 20% stenosed. Patient reported stopping rosuvastatin due to worsening arthritic pain.   Patient presents today to lipid clinic accompanied by her husband.  She reports that her pain has improved off of statin.  It is hard for her to tell with all her arthritic pain but she is pretty sure she feels better off of it.  She does have elevated triglycerides along with an elevated LDL cholesterol.  We discussed dietary interventions to help improve triglycerides.  Reviewed PCSK9 inhibitor including injections technique, side effects and cost    Current Medications: none Intolerances: Atorvastatin 40 mg daily, rosuvastatin 20 mg 3 times a week, rosuvastatin 40 mg daily  Risk Factors: Age, stroke, hypertension, CKD LDL-C goal: <55 ApoB goal: <60  Diet: olive oil, seldum eat butter,  Social History:  Social History   Socioeconomic History   Marital status: Married    Spouse name: [name]   Number of children: 0   Years of education: 12   Highest education level: Not on file  Occupational History    Employer: RETIRED     Comment: retired  Tobacco Use   Smoking status: Former    Current packs/day: 0.00    Types: Cigarettes    Quit date: 12/11/1964    Years since quitting: 58.7   Smokeless tobacco: Never   Tobacco comments:    Quit 40 years ago  Vaping Use   Vaping status: Never Used  Substance and Sexual Activity   Alcohol use: No   Drug use: No   Sexual activity: Yes    Partners: Male    Comment: married- 52 yrs   Other Topics Concern   Not on file  Social History Narrative   Patient lives at home with [name]). Patient is retired.    Social Drivers of Corporate investment banker Strain: Not on file  Food Insecurity: Not on file  Transportation Needs: Not on file  Physical Activity: Not on file  Stress: Not on file  Social Connections: Not on file  Intimate Partner Violence: Not on file     Labs: Lipid Panel     Component Value Date/Time   CHOL 262 (H) 07/03/2023 1226   TRIG 214 (H) 07/03/2023 1226   HDL 45 07/03/2023 1226   CHOLHDL 5.8 (H) 07/03/2023 1226   CHOLHDL 4.4 11/17/2021 0132   VLDL 25 11/17/2021 0132   LDLCALC 177 (H) 07/03/2023 1226   LABVLDL 40 07/03/2023 1226    Past Medical History:  Diagnosis Date   Anxiety  Arthritis    Balance problem    Depression    Double vision 10/2014   bilateral   Dyslipidemia    High blood pressure    Multiple thyroid nodules    OSA (obstructive sleep apnea)    Paroxysmal SVT (supraventricular tachycardia) (HCC)    PVC (premature ventricular contraction)    intermittent   Syncope and collapse    last friday had alot of dizziness    Current Outpatient Medications on File Prior to Visit  Medication Sig Dispense Refill   amLODipine (NORVASC) 5 MG tablet Take 1 tablet (5 mg total) by mouth daily. 30 tablet 2   No current facility-administered medications on file prior to visit.    Allergies  Allergen Reactions   Epinephrine Itching   Contrast Media [Iodinated Contrast Media] Itching    Pt had itchy nose and  dry throat and was told she was allergic to IV contrast and does not want to have it.  PT DENIES HAVING A CONTRAST ALLERGY   Keflex [Cephalexin] Swelling    Assessment/Plan:  1. Hyperlipidemia -  No problem-specific Assessment & Plan notes found for this encounter.    Thank you,  Olene Floss, Pharm.D, BCACP, CPP [facility] [address] [place], [place] [address]  Phone: [phone]; Fax: [phone]

## 2023-08-15 NOTE — Patient Instructions (Signed)

## 2023-08-16 ENCOUNTER — Other Ambulatory Visit: Payer: Self-pay | Admitting: Internal Medicine

## 2023-08-16 ENCOUNTER — Other Ambulatory Visit: Payer: Medicare Other

## 2023-08-16 ENCOUNTER — Ambulatory Visit
Admission: RE | Admit: 2023-08-16 | Discharge: 2023-08-16 | Disposition: A | Discharge status: Home / Self Care | Payer: Medicare Other | Source: Ambulatory Visit | Attending: Internal Medicine | Admitting: Internal Medicine

## 2023-08-16 DIAGNOSIS — N63 Unspecified lump in unspecified breast: Secondary | ICD-10-CM

## 2023-08-16 MED ORDER — REPATHA SURECLICK 140 MG/ML ~~LOC~~ SOAJ: 1.0000 mL | SUBCUTANEOUS | 3 refills | Status: AC

## 2023-08-16 NOTE — Addendum Note (Signed)
Addended by: Malena Peer D on: 08/16/2023 09:53 AM   Modules accepted: Orders

## 2023-08-16 NOTE — Telephone Encounter (Signed)
Spoke with patient. 3 month supply sent to [facility].

## 2023-08-17 ENCOUNTER — Encounter: Payer: Self-pay | Admitting: Pharmacist

## 2023-08-17 MED ORDER — CLOPIDOGREL BISULFATE 75 MG PO TABS: 75.0000 mg | ORAL_TABLET | Freq: Every day | ORAL | 3 refills | Status: DC

## 2023-08-25 IMAGING — MR MR HEAD W/O CM
12 of 13 series · 44 of 48 positions shown · non-contrast
Comparison: 06/12/2015 MRI and MRA head clinical correlation is
also made with 11/16/2021 CT head

CLINICAL DATA: Dizziness, unsteady gait, left arm disc
coordination, stroke suspected

EXAM:
MRI HEAD WITHOUT CONTRAST
MRA HEAD WITHOUT CONTRAST
MRA NECK WITHOUT CONTRAST
TECHNIQUE: Multiplanar, multi-echo pulse sequences of the brain and surrounding
structures were acquired without intravenous contrast. Angiographic
images of the Circle of Willis were acquired using MRA technique
without intravenous contrast. Angiographic images of the neck were
acquired using MRA technique without intravenous contrast. Carotid
stenosis measurements (when applicable) are obtained utilizing
NASCET criteria, using the distal internal carotid diameter as the
denominator.
The patient refused contrast.

[Series 5: DWI · axial · 3.0mm · 0.88mm/px · z∈[-124,+21]mm · 8 of 100 slices shown (1 of 4)]
[im 1/100]
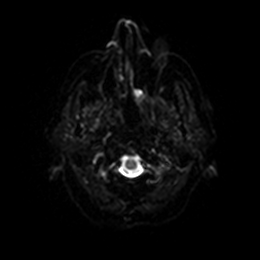
[im 15/100]
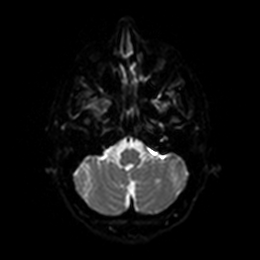
[im 29/100]
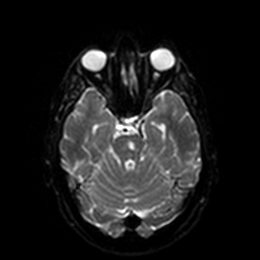
[im 43/100]
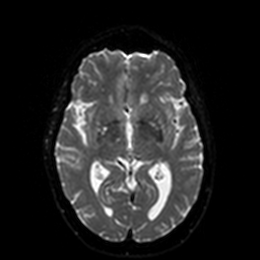
[im 57/100]
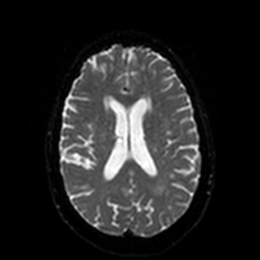
[im 71/100]
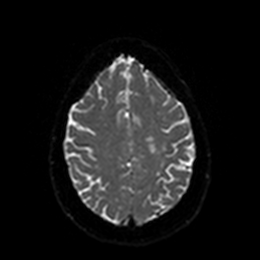
[im 85/100]
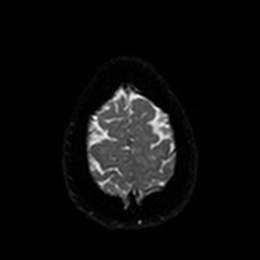
[im 100/100]
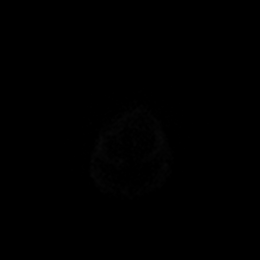

[Series 6: DWI · axial · 3.0mm · 0.88mm/px · z∈[-124,+21]mm · 4 of 50 slices shown (2 of 4)]
[im 1/50]
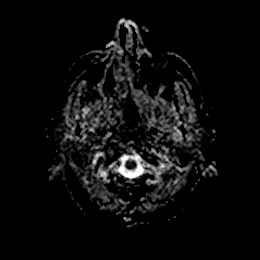
[im 17/50]
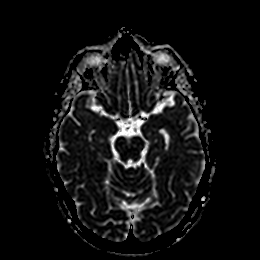
[im 33/50]
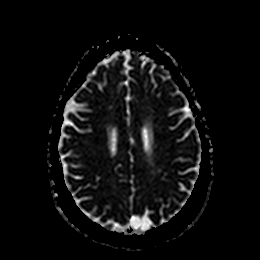
[im 50/50]
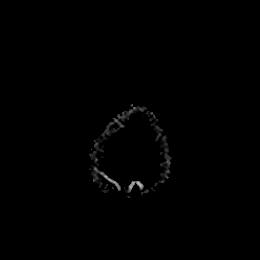

[Series 7: DWI · coronal · 4.0mm · 0.88mm/px · 5 of 72 slices shown (3 of 4)]
[im 1/72]
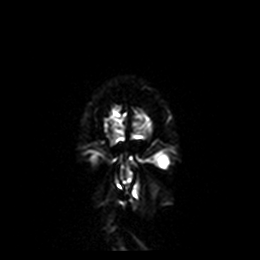
[im 18/72]
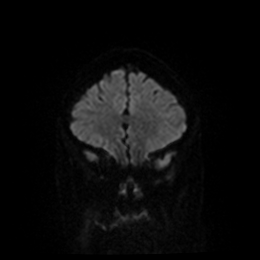
[im 36/72]
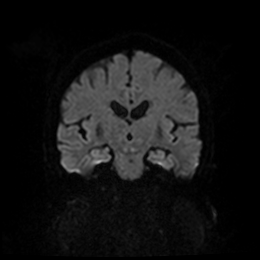
[im 54/72]
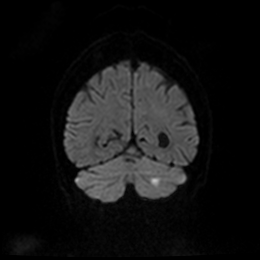
[im 72/72]
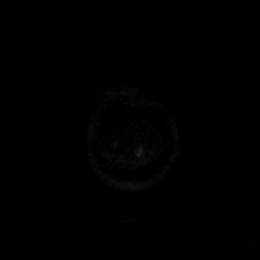

[Series 8: DWI · coronal · 4.0mm · 0.88mm/px · 3 of 36 slices shown (4 of 4)]
[im 1/36]
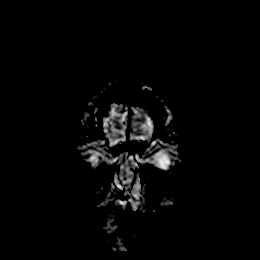
[im 18/36]
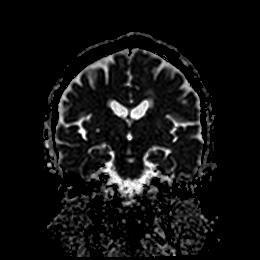
[im 36/36]
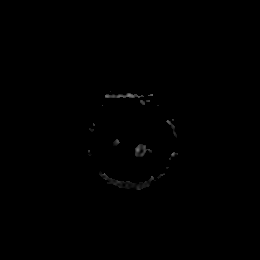

[Series 9: T1 · sagittal · 5.0mm · 0.75mm/px · 2 of 23 slices shown]
[im 1/23]
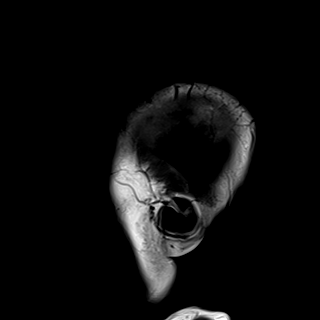
[im 23/23]
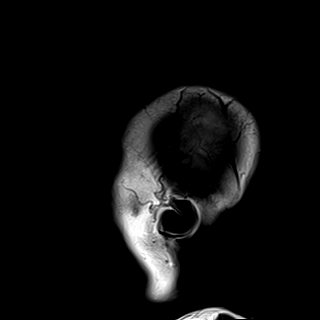

[Series 10: T2 · axial · 5.0mm · 0.72mm/px · z∈[-129,+25]mm · 2 of 27 slices shown (1 of 2)]
[im 1/27]
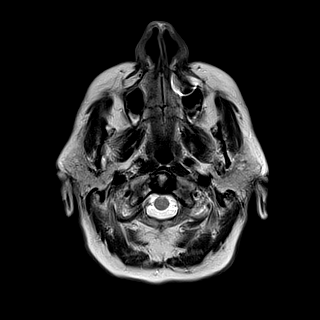
[im 27/27]
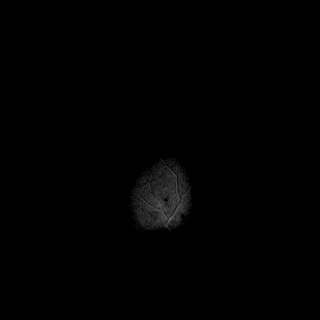

[Series 11: FLAIR · axial · 5.0mm · 0.45mm/px · z∈[-128,+26]mm · 2 of 27 slices shown]
[im 1/27]
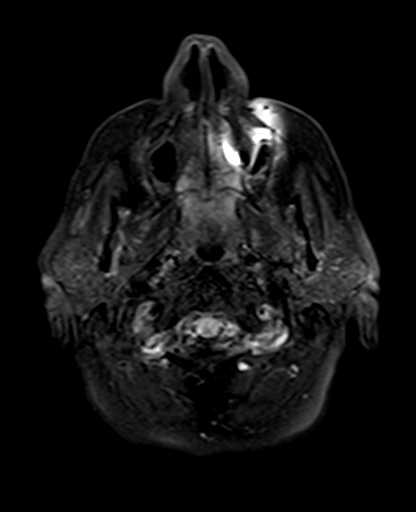
[im 27/27]
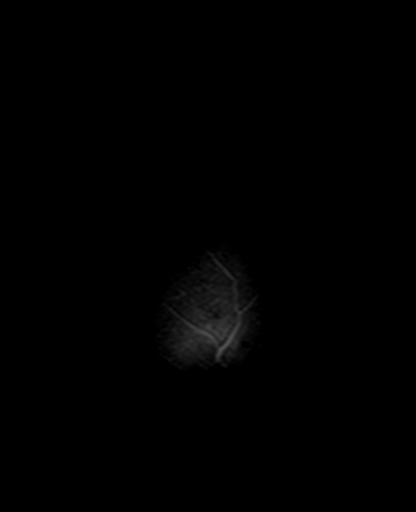

[Series 12: mag_images · axial · 3.0mm · 0.90mm/px · z∈[-132,+31]mm · 4 of 56 slices shown]
[im 1/56]
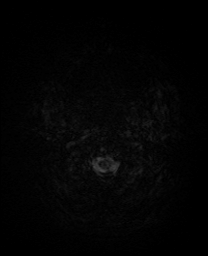
[im 19/56]
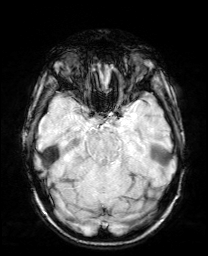
[im 37/56]
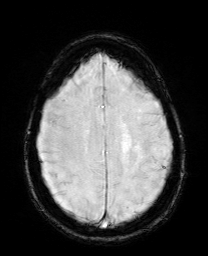
[im 56/56]
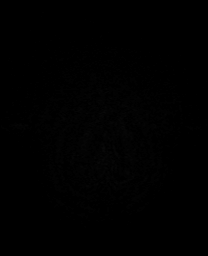

[Series 13: pha_images · axial · 3.0mm · 0.90mm/px · z∈[-129,+28]mm · 4 of 54 slices shown]
[im 1/54]
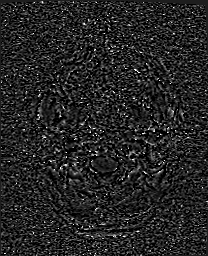
[im 18/54]
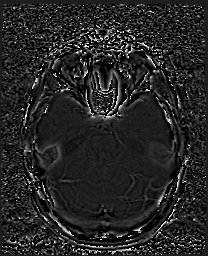
[im 36/54]
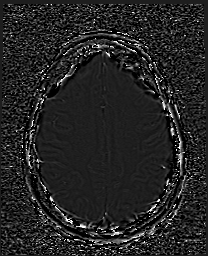
[im 54/54]
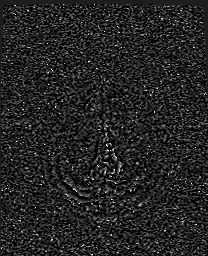

[Series 14: swi_images · axial · 3.0mm · 0.90mm/px · z∈[-132,+31]mm · 4 of 56 slices shown]
[im 1/56]
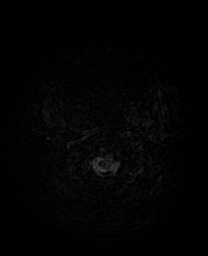
[im 19/56]
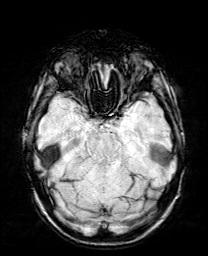
[im 37/56]
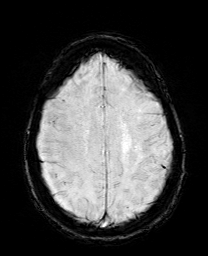
[im 56/56]
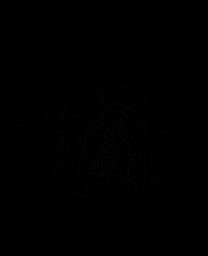

[Series 15: mip_images(sw) · axial · 24.0mm · 0.90mm/px · z∈[-122,+20]mm · 4 of 49 slices shown]
[im 1/49]
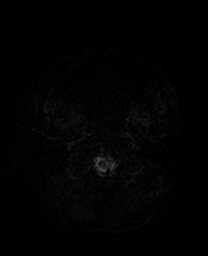
[im 17/49]
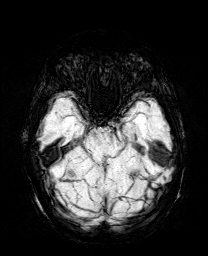
[im 33/49]
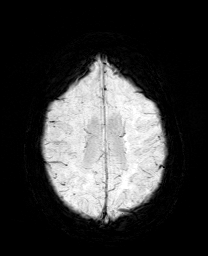
[im 49/49]
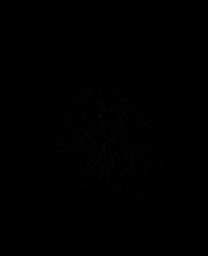

[Series 17: T2 · coronal · 5.0mm · 0.34mm/px · 2 of 31 slices shown (2 of 2)]
[im 1/31]
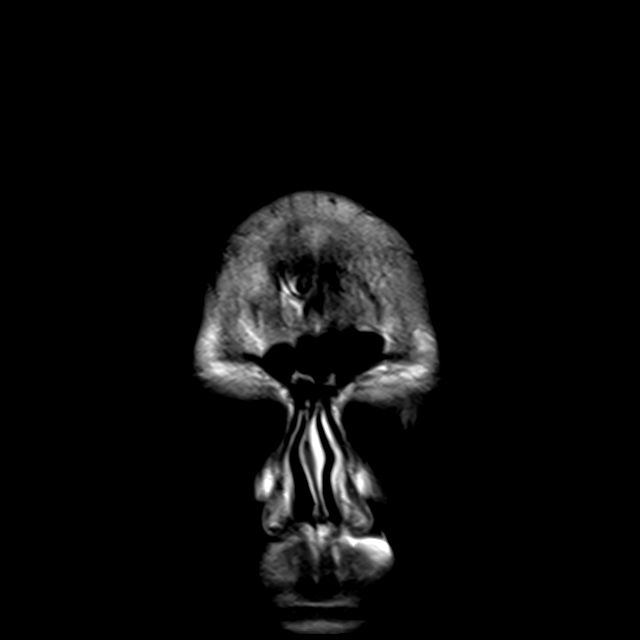
[im 31/31]
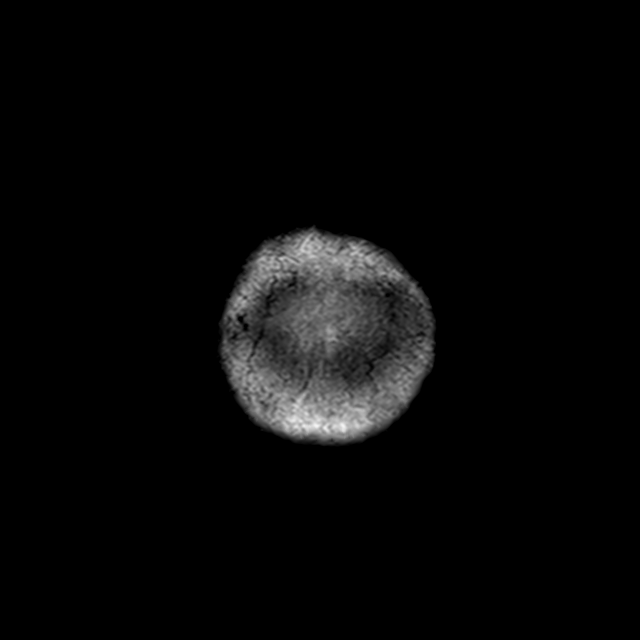

[44 of 48 positions shown; findings below may reference images not displayed]

FINDINGS: MRI HEAD FINDINGS

Brain: Two somewhat wedge-shaped areas of restricted diffusion with
ADC correlate in the mid and inferior left cerebellum (series 5,
image 60 and series 6, image 10; series 5, image 56 and series 6,
image 6). These areas are associated with minimally increased T2
hyperintense signal.

No acute hemorrhage, mass, mass effect, or midline shift. No
hydrocephalus or extra-axial collection. Lacunar infarcts in the
bilateral basal ganglia and pons.

Vascular: Please see MRA findings below.

Skull and upper cervical spine: Normal marrow signal. Degenerative
changes in the cervical spine with trace anterolisthesis and a small
disc bulge at C3-C4, which indents the ventral spinal cord.

Sinuses/Orbits: No acute finding. Status post left lens replacement.

Other: No acute intracranial process.

MRA HEAD FINDINGS

Anterior circulation: Both internal carotid arteries are patent to
the termini, with increased stenosis in the right distal ICA, near
the terminus, with multifocal moderate to severe stenosis (series 1,
images 89, 91, and 95).

A1 segments patent. Normal anterior communicating artery. Anterior
cerebral arteries are patent to their distal aspects.

Moderate multifocal narrowing of the right M1 segment (series 1,
image 100), which has progressed from the prior exam. Moderate
multifocal narrowing in the right M2 branches (for example series 1,
image 102 and 92), new from the prior exam, with similar additional
multifocal irregularity and poor signal in the more distal right MCA
branches. Redemonstrated duplicate left MCA with mild irregularity
in the more proximal M1 segment but no significant stenosis. Distal
left MCA branches perfused.

Posterior circulation: Vertebral arteries patent to the
vertebrobasilar junction without stenosis. Posterior inferior
cerebral arteries patent proximally, with no signal in the left PICA
just distal to its takeoff (series 1, images 22-24), without
evidence of reconstitution, new from the prior exam. The visualized
right PICA is patent.

Basilar patent to its distal aspect, with a small fenestration
proximally. Superior cerebellar arteries patent bilaterally.

Patent right P1 segment. Fetal origin of the left PCA from a patent
left posterior communicating artery. Moderate stenosis in the mid
right P2 (series 1, images 103-108), unchanged. PCAs otherwise
perfused to their distal aspects without stenosis. The left
posterior communicating artery is not visualized.

Anatomic variants: Fetal origin of the left PCA.

MRA NECK FINDINGS

Aortic arch: Two-vessel arch with a common origin of the
brachiocephalic and left common carotid arteries. Imaged portion
shows no evidence of aneurysm or dissection. No significant stenosis
of the major arch vessel origins.

Right carotid system: No evidence of dissection, occlusion, or
hemodynamically significant stenosis (greater than 50%).

Left carotid system: No evidence of dissection, occlusion, or
hemodynamically significant stenosis (greater than 50%).

Vertebral arteries: No evidence of dissection, occlusion, or
hemodynamically significant stenosis (greater than 50%).

Other: None
IMPRESSION: 1. Acute or subacute infarcts in the PICA territory in the left mid
and inferior cerebellum, with loss of signal in the left PICA just
distal to its takeoff, which is new compared to 1939 and concerning
for occlusion. No evidence of hemorrhage, mass effect, or midline
shift.
2. Progressive intracranial atherosclerotic disease compared to the
prior exam, with moderate to severe stenosis in the distal right ICA
and moderate multifocal narrowing in the right M1 and M2 branches.
Other previously noted stenoses are overall unchanged.
3.  No hemodynamically significant stenosis in the neck.

These results will be called to the ordering clinician or
representative by the Radiologist Assistant, and communication
documented in the PACS or [REDACTED].

## 2023-12-04 ENCOUNTER — Encounter: Payer: Self-pay | Admitting: Pharmacist

## 2023-12-28 ENCOUNTER — Encounter: Payer: Self-pay | Admitting: Pharmacist

## 2024-02-28 ENCOUNTER — Telehealth: Payer: Self-pay | Admitting: Pharmacist

## 2024-02-28 NOTE — Telephone Encounter (Signed)
 Called pt to see if she was still taking Repatha . Pt states she is not taking. She didn't stop due to side effect or cost, just doesn't want to take.

## 2024-05-08 ENCOUNTER — Emergency Department (HOSPITAL_COMMUNITY)

## 2024-05-08 ENCOUNTER — Emergency Department (HOSPITAL_COMMUNITY): Admission: EM | Admit: 2024-05-08 | Discharge: 2024-05-08 | Disposition: A | Discharge status: Home / Self Care

## 2024-05-08 ENCOUNTER — Other Ambulatory Visit: Payer: Self-pay

## 2024-05-08 ENCOUNTER — Encounter (HOSPITAL_COMMUNITY): Payer: Self-pay

## 2024-05-08 DIAGNOSIS — I10 Essential (primary) hypertension: Secondary | ICD-10-CM | POA: Diagnosis not present

## 2024-05-08 DIAGNOSIS — M545 Low back pain, unspecified: Secondary | ICD-10-CM | POA: Diagnosis not present

## 2024-05-08 DIAGNOSIS — R448 Other symptoms and signs involving general sensations and perceptions: Secondary | ICD-10-CM

## 2024-05-08 DIAGNOSIS — R202 Paresthesia of skin: Secondary | ICD-10-CM | POA: Insufficient documentation

## 2024-05-08 DIAGNOSIS — Z8673 Personal history of transient ischemic attack (TIA), and cerebral infarction without residual deficits: Secondary | ICD-10-CM | POA: Diagnosis not present

## 2024-05-08 DIAGNOSIS — Z79899 Other long term (current) drug therapy: Secondary | ICD-10-CM | POA: Diagnosis not present

## 2024-05-08 DIAGNOSIS — F17210 Nicotine dependence, cigarettes, uncomplicated: Secondary | ICD-10-CM | POA: Insufficient documentation

## 2024-05-08 LAB — BASIC METABOLIC PANEL WITH GFR
Anion gap: 13 (ref 5–15)
BUN: 15 mg/dL (ref 8–23)
CO2: 20 mmol/L — ABNORMAL LOW (ref 22–32)
Calcium: 9.4 mg/dL (ref 8.9–10.3)
Chloride: 106 mmol/L (ref 98–111)
Creatinine, Ser: 0.97 mg/dL (ref 0.44–1.00)
GFR, Estimated: 59 mL/min — ABNORMAL LOW (ref >60)
Glucose, Bld: 102 mg/dL — ABNORMAL HIGH (ref 70–99)
Potassium: 3.9 mmol/L (ref 3.5–5.1)
Sodium: 139 mmol/L (ref 135–145)

## 2024-05-08 LAB — CBG MONITORING, ED: Glucose-Capillary: 112 mg/dL — ABNORMAL HIGH (ref 70–99)

## 2024-05-08 NOTE — Discharge Instructions (Addendum)
 Please follow up with you doctor and return to the ER for worsening symptoms.  Also follow-up with [facility] neurology give them a call tomorrow for outpatient follow-up since they already see you.  Be careful with getting around at home.  Your MRIs brain thoracic lumbar spine without any acute findings.

## 2024-05-08 NOTE — ED Triage Notes (Signed)
 Pt presented to the ED for Right side sensation changes x 4 days and dry mouth/speech changes in the last hour.  Dr. Jacinto and Chiquita- Stroke RN met pt in lobby and took her to bridge.

## 2024-05-08 NOTE — ED Notes (Signed)
 Pt back from MRI

## 2024-05-08 NOTE — ED Notes (Signed)
 Pt was given discharge instructions and verbalized understanding. Husband at bed side. Pt ambulated out without difficulty.

## 2024-05-08 NOTE — ED Provider Notes (Signed)
 [place] [facility]GENCY DEPARTMENT AT [facility] Provider Note   CSN: [id number] Arrival date & time: 05/08/24  1314     Patient presents with: Weakness   [name] is a 78 y.o. female.   78 year old female with past medical history of CVA in the past on Plavix  as well as hypertension presenting to the emergency department today with some right sided paresthesias.  Is mostly right lower extremity.  Patient reports abnormal sensation and all some tingling mostly in the right lower extremity.  She apparently was leaning to the right earlier today and was brought to the ER at that time further evaluation.  The patient was evaluated by Dr. Michaela on arrival.  She currently had some speech issues as well roughly 1 hour prior to arrival.   Weakness      Prior to Admission medications   Medication Sig Start Date End Date Taking? Authorizing Provider  amLODipine  (NORVASC ) 5 MG tablet Take 1 tablet (5 mg total) by mouth daily. 11/18/21   Ghimire, Donalda CHRISTELLA, MD  clopidogrel  (PLAVIX ) 75 MG tablet Take 1 tablet (75 mg total) by mouth daily. 08/17/23   Jeffrie Oneil BROCKS, MD  Evolocumab  (REPATHA  SURECLICK) 140 MG/ML SOAJ Inject 140 mg into the skin every 14 (fourteen) days. 08/16/23   Jeffrie Oneil BROCKS, MD    Allergies: Epinephrine, Contrast media [iodinated contrast media], and Keflex [cephalexin]    Review of Systems  Neurological:  Positive for weakness.  All other systems reviewed and are negative.   Updated Vital Signs BP (!) 171/77   Pulse 73   Temp 97.7 F (36.5 C)   Resp 11   Ht 5' (1.524 m)   Wt 66.2 kg   SpO2 100%   BMI 28.50 kg/m   Physical Exam Vitals and nursing note reviewed.   Gen: NAD Eyes: PERRL, EOMI HEENT: no oropharyngeal swelling Neck: trachea midline Resp: clear to auscultation bilaterally Card: RRR, no murmurs, rubs, or gallops Abd: nontender, nondistended Extremities: no calf tenderness, no edema Vascular: 2+ radial pulses bilaterally,  2+ DP pulses bilaterally Neuro: Diminished sensation over the right lower extremity, please see NIH stroke scale from stroke team who evaluated the patient on my initial arrival Skin: no rashes Psyc: acting appropriately   (all labs ordered are listed, but only abnormal results are displayed) Labs Reviewed  BASIC METABOLIC PANEL WITH GFR - Abnormal; Notable for the following components:      Result Value   CO2 20 (*)    Glucose, Bld 102 (*)    GFR, Estimated 59 (*)    All other components within normal limits  CBG MONITORING, ED - Abnormal; Notable for the following components:   Glucose-Capillary 112 (*)    All other components within normal limits  CBC    EKG: EKG Interpretation Date/Time:  Wednesday May 08 2024 13:43:02 EST Ventricular Rate:  85 PR Interval:  138 QRS Duration:  79 QT Interval:  391 QTC Calculation: 465 R Axis:   7  Text Interpretation: Sinus rhythm Probable anteroseptal infarct, old Confirmed by Ula Barter [phone]) on 05/08/2024 1:47:35 PM  Radiology: CT HEAD WO CONTRAST ( ) Result Date: 05/08/2024 EXAM: CT HEAD WITHOUT CONTRAST 05/08/2024 01:34:10 PM TECHNIQUE: CT of the head was performed without the administration of intravenous contrast. Automated exposure control, iterative reconstruction, and/or weight based adjustment of the mA/kV was utilized to reduce the radiation dose to as low as reasonably achievable. COMPARISON: MRI 11/16/2021. Head CT 11/16/2021. CLINICAL HISTORY: 78 year old  female. Neuro deficit, acute, stroke suspected. FINDINGS: BRAIN AND VENTRICLES: No acute hemorrhage. No evidence of acute infarct. No hydrocephalus. No extra-axial collection. No mass effect or midline shift. Chronic ischemic changes in the brain including a prominent chronic left central pontine lacunar infarct, patchy chronic left cerebellum posterior and inferior infarcts, mild to moderate additional bilateral cerebral white matter hypodensity, asymmetrically  greater in the left hemisphere. Small but circumscribed hypodensity in the lateral right thalamus is new from previous exams but appears likely to be chronic on series 2 image 16. Chronic right basal ganglia lacunar infarcts are stable. Stable gray white differentiation otherwise. No suspicious intracranial vascular hyperdensity. ORBITS: No acute abnormality. No gaze deviation. SINUSES: Visible paranasal sinuses, middle ears and mastoids remain well aerated. SOFT TISSUES AND SKULL: No acute soft tissue abnormality. No skull fracture. Calcified atherosclerosis at the skull base. No new intracranial abnormality. IMPRESSION: 1. Chronic - mostly small vessel type- ischemic disease, which has progressed in the right lateral thalamus since 2023 exams, but is favored to be chronic. 2. No acute intracranial abnormality identified. Electronically signed by: Helayne Hurst MD 05/08/2024 01:48 PM EST RP Workstation: [id number]     Procedures   Medications Ordered in the ED - No data to display                                  Medical Decision Making 78 year old female with past medical history of CVA in the past presenting to the emergency department today with concern for some right lower extremity sensory changes.  The patient was evaluate by neurology.  Recommended MRI of her brain as well as T and L-spine for further evaluation.  This is ordered.  The patient is outside the window for thrombolytics at this time does not have findings consistent with large vessel occlusion.  The patient's initial labs are reassuring.  Plan is for discharge if MRIs are negative.  MRI studies pending at the time of signout.  Amount and/or Complexity of Data Reviewed Labs: ordered. Radiology: ordered.        Final diagnoses:  Paresthesia of right leg    ED Discharge Orders     None          Ula Prentice SAUNDERS, MD 05/08/24 1559

## 2024-05-08 NOTE — Consult Note (Addendum)
 NEUROLOGY CONSULT NOTE   Date of service: May 08, 2024 Patient Name: [name] MRN:  [id number] DOB:  09-Sep-1945 Chief Complaint: sensory changes Requesting Provider: Ula Prentice SAUNDERS, MD  History of Present Illness  [name] is a 78 y.o. female with hx of CVA 2023, HTN, HLD, Pre-Diabetes, Medication non-adherence, SVT, anxiety/depression, Mnire's disease, remote smoking history  who presented to ED due to right-sided sensory changes x 4 days and lean to left that started today at noon.   On exam, patient is oriented and interactive, able to provide clear history, c/o lower back pain, no focal deficits, decreased sensation to RLE, slight ataxia and decreased left hand fmm (chronic fro previous cva). CTH negative.   Patient is established outpatient-wise with Dr. Rosemarie at [facility]. Her last appt there was March 2024 (saw NP McCue). At this appt, she reported residual mild imbalance, diffuse pain and left hand fingertip numbness, fluctuation of left hand numbness at times. She has history of left ear tinnitus and noted at this appt to have stopped the aspirin  because the thought it made the tinnitus worse. Also noted to have stopped her Crestor  as she thought it was contributing to her pain. Endorsed occasional heart palpitations and was diagnosed with SVT by outpatient cardiology.   LKW: 4 days ago Modified rankin score: 1-No significant post stroke disability and can perform usual duties with stroke symptoms IV Thrombolysis: No, outside of window EVT: No, no LVO suspected  NIHSS components Score: Comment  1a Level of Conscious 0[]  1[]  2[]  3[]      1b LOC Questions 0[]  1[]  2[]       1c LOC Commands 0[]  1[]  2[]       2 Best Gaze 0[]  1[]  2[]       3 Visual 0[]  1[]  2[]  3[]      4 Facial Palsy 0[]  1[]  2[]  3[]      5a Motor Arm - left 0[]  1[]  2[]  3[]  4[]  UN[]    5b Motor Arm - Right 0[]  1[]  2[]  3[]  4[]  UN[]    6a Motor Leg - Left 0[]  1[]  2[]  3[]  4[]  UN[]    6b Motor Leg - Right 0[]   1[]  2[]  3[]  4[]  UN[]    7 Limb Ataxia 0[]  1[x]  2[]  UN[]      8 Sensory 0[]  1[x]  2[]  UN[]      9 Best Language 0[]  1[]  2[]  3[]      10 Dysarthria 0[]  1[]  2[]  UN[]      11 Extinct. and Inattention 0[]  1[]  2[]       TOTAL:   2      ROS  Comprehensive ROS performed and pertinent positives documented in HPI   Past History   Past Medical History:  Diagnosis Date   Anxiety    Arthritis    Balance problem    Depression    Double vision 10/2014   bilateral   Dyslipidemia    High blood pressure    Multiple thyroid  nodules    OSA (obstructive sleep apnea)    Paroxysmal SVT (supraventricular tachycardia)    PVC (premature ventricular contraction)    intermittent   Syncope and collapse    last friday had alot of dizziness    Past Surgical History:  Procedure Laterality Date   ABDOMINAL HYSTERECTOMY     catheter ablation     LEFT HEART CATH AND CORONARY ANGIOGRAPHY N/A 02/07/2022   Procedure: LEFT HEART CATH AND CORONARY ANGIOGRAPHY;  Surgeon: Jordan, Peter M, MD;  Location: [facility];  Service: Cardiovascular;  Laterality:  N/A;   TONSILLECTOMY      Family History: Family History  Problem Relation Age of Onset   High blood pressure Father    Stroke Father 38   CAD Father 1       CABG.  Lived to be 33   Arthritis Mother    Neuropathy Mother     Social History  reports that she quit smoking about 59 years ago. Her smoking use included cigarettes. She has never used smokeless tobacco. She reports that she does not drink alcohol and does not use drugs.  Allergies  Allergen Reactions   Epinephrine Itching   Contrast Media [Iodinated Contrast Media] Itching    Pt had itchy nose and dry throat and was told she was allergic to IV contrast and does not want to have it.  PT DENIES HAVING A CONTRAST ALLERGY   Keflex [Cephalexin] Swelling    Medications  No current facility-administered medications for this encounter.  Current Outpatient Medications:    amLODipine   (NORVASC ) 5 MG tablet, Take 1 tablet (5 mg total) by mouth daily., Disp: 30 tablet, Rfl: 2   clopidogrel  (PLAVIX ) 75 MG tablet, Take 1 tablet (75 mg total) by mouth daily., Disp: 90 tablet, Rfl: 3   Evolocumab  (REPATHA  SURECLICK) 140 MG/ML SOAJ, Inject 140 mg into the skin every 14 (fourteen) days., Disp: 6 mL, Rfl: 3  Vitals   Vitals:   05/08/24 1319 05/08/24 1325 05/08/24 1332 05/08/24 1341  BP: (!) 172/77 (!) 169/98  (!) 178/82  Pulse: 88 88  86  Resp: 20 16  12   Temp: 97.7 F (36.5 C)     SpO2: 98% 98%  98%  Weight:   66.2 kg     Body mass index is 28.5 kg/m.   Physical Exam   Constitutional: Appears well-developed and well-nourished.  Cardiovascular: Normal rate and regular rhythm.  Respiratory: Effort normal, non-labored breathing.   Neurologic Examination   Neuro: Mental Status: Patient is awake, alert, oriented to person, place, month, year, and situation. Patient is able to give a clear and coherent history. No signs of aphasia or neglect Cranial Nerves: II: Visual Fields are full. Pupils are equal, round, and reactive to light.   III,IV, VI: EOMI without ptosis or diploplia.  V: Facial sensation is symmetric to temperature VII: Facial movement is symmetric.  VIII: hearing is intact to voice X: Uvula elevates symmetrically XI: Shoulder shrug is symmetric. XII: tongue is midline without atrophy or fasciculations.  Motor: Tone is normal. Bulk is normal. 5 BLE: 4+/5 due to lower back pain   Sensory: Sensation to light touch and temperature is reduced to RLE Deep Tendon Reflexes: 2+ and symmetric in the biceps and 1+ patellae.  Plantars: Toes are downgoing bilaterally.  Cerebellar: FNF with slight ataxia to left hand (chronic d/t previous CVA) Decreased fine motor movement on left hand (chronic d/t previous CVA) HKS are intact bilaterally   Labs/Imaging/Neurodiagnostic studies   CBC: No results for input(s): WBC, NEUTROABS, HGB, HCT, MCV,  PLT in the last 168 hours. Basic Metabolic Panel:  Lab Results  Component Value Date   NA 143 07/03/2023   K 4.3 07/03/2023   CO2 21 07/03/2023   GLUCOSE 95 07/03/2023   BUN 19 07/03/2023   CREATININE 1.01 (H) 07/03/2023   CALCIUM  9.9 07/03/2023   GFRNONAA >60 02/07/2022   GFRAA >60 06/12/2015   Lipid Panel:  Lab Results  Component Value Date   LDLCALC 177 (H) 07/03/2023   HgbA1c:  Lab  Results  Component Value Date   HGBA1C 6.3 (H) 11/17/2021   Urine Drug Screen:     Component Value Date/Time   LABOPIA NONE DETECTED 11/16/2021 1600   COCAINSCRNUR NONE DETECTED 11/16/2021 1600   LABBENZ NONE DETECTED 11/16/2021 1600   AMPHETMU NONE DETECTED 11/16/2021 1600   THCU NONE DETECTED 11/16/2021 1600   LABBARB NONE DETECTED 11/16/2021 1600    Alcohol Level     Component Value Date/Time   ETH <10 11/16/2021 1513   INR  Lab Results  Component Value Date   INR 1.0 11/16/2021   APTT  Lab Results  Component Value Date   APTT 26 11/16/2021   AED levels: No results found for: PHENYTOIN, ZONISAMIDE, LAMOTRIGINE, LEVETIRACETA  CT Head without contrast(Personally reviewed): Chronic - mostly small vessel type- ischemic disease, which has progressed in the right lateral thalamus since 2023 exams, but is favored to be chronic. No acute intracranial abnormality identified  CT angio Head and Neck with contrast(Personally reviewed): pending  MRI Brain(Personally reviewed): pending    ASSESSMENT   [name] is a 78 y.o. female with hx of CVA 2023, HTN, HLD, Pre-Diabetes, Medication non-adherence, SVT, anxiety/depression, Mnire's disease, remote smoking history  who presented to ED due to right-sided sensory changes x 4 days.   On exam, patient is oriented and interactive, able to provide clear history, c/o lower back pain, no focal deficits, decreased temperature sensation to RLE, slight ataxia and decreased left hand fmm (chronic from previous cva). CTH  negative.   Due to the pain noted in her lower back, recommend lumbar spine imaging. She is also c/o decreased sensation to her right lower leg with decreased temperature sensation below the calf. Because of this sensory deficit, also recommend imaging of the thoracic spine.  RECOMMENDATIONS   - MRI Brain  If positive for stroke, admit for further stroke workup. - MRI T and L Spine  ______________________________________________________________________  Signed, Rocky JAYSON Likes, NP [facility]   I have seen the patient reviewed the above note.  She does have decreased temperature sensation in her right leg compared to her left, of unclear etiology.  With pain on the left and sensory change on the right, I think a thoracic MRI would be reasonable as well though she has no signs of myelopathy on exam.  If no clear findings on MRI, then I think the next step would be to consider an EMG which would need to be done as an outpatient.  Kristie Seals, MD [facility]   If 7pm- 7am, please page neurology on call as listed in AMION.

## 2024-05-08 NOTE — ED Provider Notes (Signed)
 Discussed with Dr. Belvie neurology.  Patient's MRI brain lumbar and thoracic spine without any explanation for her symptoms.  He thinks the pain may be contributing to this along with a previous existing gait problem.  Feels that patient can be discharged home with follow-up.   Jeri Jeanbaptiste, MD 05/08/24 [phone]

## 2024-05-08 NOTE — ED Notes (Signed)
 Patient transported to MRI

## 2024-05-08 NOTE — ED Notes (Signed)
 Nurse went to collect blood prior to discharge and pt stated it can be drawn at her doctors office some other time

## 2024-05-13 ENCOUNTER — Encounter: Payer: Self-pay | Admitting: Diagnostic Neuroimaging

## 2024-05-13 ENCOUNTER — Ambulatory Visit: Admitting: Diagnostic Neuroimaging

## 2024-05-13 VITALS — BP 164/77 | HR 79 | Ht 60.0 in | Wt 147.0 lb

## 2024-05-13 DIAGNOSIS — G4733 Obstructive sleep apnea (adult) (pediatric): Secondary | ICD-10-CM | POA: Insufficient documentation

## 2024-05-13 DIAGNOSIS — G9340 Encephalopathy, unspecified: Secondary | ICD-10-CM | POA: Insufficient documentation

## 2024-05-13 DIAGNOSIS — F458 Other somatoform disorders: Secondary | ICD-10-CM | POA: Insufficient documentation

## 2024-05-13 DIAGNOSIS — L501 Idiopathic urticaria: Secondary | ICD-10-CM | POA: Insufficient documentation

## 2024-05-13 DIAGNOSIS — E2839 Other primary ovarian failure: Secondary | ICD-10-CM | POA: Insufficient documentation

## 2024-05-13 DIAGNOSIS — E7800 Pure hypercholesterolemia, unspecified: Secondary | ICD-10-CM | POA: Insufficient documentation

## 2024-05-13 DIAGNOSIS — F411 Generalized anxiety disorder: Secondary | ICD-10-CM | POA: Insufficient documentation

## 2024-05-13 DIAGNOSIS — I639 Cerebral infarction, unspecified: Secondary | ICD-10-CM | POA: Insufficient documentation

## 2024-05-13 DIAGNOSIS — I739 Peripheral vascular disease, unspecified: Secondary | ICD-10-CM | POA: Insufficient documentation

## 2024-05-13 DIAGNOSIS — R200 Anesthesia of skin: Secondary | ICD-10-CM | POA: Diagnosis not present

## 2024-05-13 DIAGNOSIS — M1991 Primary osteoarthritis, unspecified site: Secondary | ICD-10-CM | POA: Insufficient documentation

## 2024-05-13 NOTE — Progress Notes (Signed)
 [facility]  PATIENT: [name] DOB: 1946/05/27  REFERRING CLINICIAN: Rexanne Ingle, MD  HISTORY FROM: patient and husband  REASON FOR VISIT: new consult   HISTORICAL  CHIEF COMPLAINT:  Chief Complaint  Patient presents with   RM 6    ER follow up; Paresthesia of right leg; with husband    HISTORY OF PRESENT ILLNESS:   UPDATE (05/13/24, VRP): Since last visit, doing well until 05/04/24, noticed right buttock, leg, foot decr sensitivity to cold temp. Went to ER on 05/08/24 (MRI brain, t and l spine unremarkable). Still has symptoms.   UPDATE (12/09/19, VRP): Since last 6 to 12 months patient is under increased stress and tension, anxiety, depression, related to Covid pandemic, political events, collections, and other factors.  She finds her self increasingly concerned and worried about the future.  She has developed intermittent shaking and tremor in her arms and hands.  Sometimes she wakes up in the middle the night with internal sensation of shaking lasting for 30 seconds at a time.  Patient also having issues with her neck, arms, legs.  She continues to be reluctant to take any medications, seeing psychiatry or psychology, take blood pressure medicines or other treatments.  She is hopeful to use natural treatments.  She is also worried about her intracranial atherosclerosis on prior imaging.  She is worried about risk for stroke.  Yet she still is reluctant to take vascular risk factor reduction medications.  UPDATE 07/20/15: Since last visit, was in the hospital for visual disturbance (colors, zig-zags, blurred vision) x 20 minutes. No headache. Admitted in 06/11/15 to hospital. 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 No recurrent symptoms. Reluctant to start BP med and statin.   PRIOR HPI (01/13/15): 78 year old right-handed femalemale here for evaluation of dizziness, double vision, hearing loss, ringing in ears. 11/12/2014 patient had pressure and fullness  sensation in her left ear. Within a couple of days she developed significant vertigo attack with nausea, vomiting, balance difficulty. She had decreased hearing in the left ear. She is at intermittent double vision and tilted vision. Patient went to ENT locally as well as at [facility] for evaluation. She was diagnosed with Mnire's disease and prescribed Valium  and diuretic which she took for a few days and then stopped. Patient was also recommended to have MRI of the brain, however she canceled this due to concern about this possibly damaging her hearing further. Patient also went to ophthalmologist for evaluation of double vision. No specific eye related pathology was found. Patient was then referred to me for further evaluation. No prodromal trauma, infections, triggering factors. Patient has a history of TIA in the past. She also has history of cervical spinal stenosis. She's had some milder balance problems as far back as 2014 and previously saw my colleague Dr. Onita.   REVIEW OF SYSTEMS: Full 14 system review of systems performed and notable only for hearing loss ringing in ears blurred vision joint pain aching muscles muscle cramps temp intolerance.    ALLERGIES: Allergies  Allergen Reactions   Epinephrine Itching   Contrast Media [Iodinated Contrast Media] Itching    Pt had itchy nose and dry throat and was told she was allergic to IV contrast and does not want to have it.  PT DENIES HAVING A CONTRAST ALLERGY   Keflex [Cephalexin] Swelling    HOME MEDICATIONS: Outpatient Medications Prior to Visit  Medication Sig Dispense Refill   amLODipine  (NORVASC ) 5 MG tablet Take 1 tablet (5 mg total) by  mouth daily. 30 tablet 2   aspirin  EC 81 MG tablet Take 81 mg by mouth daily. Swallow whole.     CELEBREX 200 MG capsule Take 200 mg by mouth daily as needed.     Evolocumab  (REPATHA  SURECLICK) 140 MG/ML SOAJ Inject 140 mg into the skin every 14 (fourteen) days. (Patient not taking: Reported on  05/13/2024) 6 mL 3   clopidogrel  (PLAVIX ) 75 MG tablet Take 1 tablet (75 mg total) by mouth daily. 90 tablet 3   No facility-administered medications prior to visit.    PAST MEDICAL HISTORY: Past Medical History:  Diagnosis Date   Anxiety    Arthritis    Balance problem    Depression    Double vision 10/2014   bilateral   Dyslipidemia    High blood pressure    Multiple thyroid  nodules    OSA (obstructive sleep apnea)    Paroxysmal SVT (supraventricular tachycardia)    PVC (premature ventricular contraction)    intermittent   Syncope and collapse    last friday had alot of dizziness    PAST SURGICAL HISTORY: Past Surgical History:  Procedure Laterality Date   ABDOMINAL HYSTERECTOMY     catheter ablation     LEFT HEART CATH AND CORONARY ANGIOGRAPHY N/A 02/07/2022   Procedure: LEFT HEART CATH AND CORONARY ANGIOGRAPHY;  Surgeon: Jordan, Peter M, MD;  Location: [facility];  Service: Cardiovascular;  Laterality: N/A;   TONSILLECTOMY      FAMILY HISTORY: Family History  Problem Relation Age of Onset   High blood pressure Father    Stroke Father 32   CAD Father 67       CABG.  Lived to be 2   Arthritis Mother    Neuropathy Mother     SOCIAL HISTORY:  Social History   Socioeconomic History   Marital status: Married    Spouse name: [name]   Number of children: 0   Years of education: 12   Highest education level: Not on file  Occupational History    Employer: RETIRED    Comment: retired  Tobacco Use   Smoking status: Former    Current packs/day: 0.00    Types: Cigarettes    Quit date: 12/11/1964    Years since quitting: 59.4   Smokeless tobacco: Never   Tobacco comments:    Quit 40 years ago  Vaping Use   Vaping status: Never Used  Substance and Sexual Activity   Alcohol use: No   Drug use: No   Sexual activity: Yes    Partners: Male    Comment: married- 52 yrs   Other Topics Concern   Not on file  Social History Narrative   Patient lives at  home with her husband [name]). Patient is retired.    Social Drivers of Corporate Investment Banker Strain: Not on file  Food Insecurity: Not on file  Transportation Needs: Not on file  Physical Activity: Not on file  Stress: Not on file  Social Connections: Not on file  Intimate Partner Violence: Not on file     PHYSICAL EXAM  GENERAL EXAM/CONSTITUTIONAL: Vitals:  Vitals:   05/13/24 1429  BP: (!) 164/77  Pulse: 79  Weight: 147 lb (66.7 kg)  Height: 5' (1.524 m)   Body mass index is 28.71 kg/m. No results found. Patient is in no distress; well developed, nourished and groomed; neck is supple  CARDIOVASCULAR: Examination of carotid arteries is normal; no carotid bruits Regular rate and rhythm,  no murmurs Examination of peripheral vascular system by observation and palpation is normal; EXCEPT SLIGHTLY DECR RIGHT DORSALIS PEDIS PULSE  EYES: Ophthalmoscopic exam of optic discs and posterior segments is normal; no papilledema or hemorrhages  MUSCULOSKELETAL: Gait, strength, tone, movements noted in Neurologic exam below  NEUROLOGIC: MENTAL STATUS:      No data to display         awake, alert, oriented to person, place and time recent and remote memory intact normal attention and concentration language fluent, comprehension intact, naming intact,  fund of knowledge appropriate  CRANIAL NERVE:  2nd - no papilledema on fundoscopic exam 2nd, 3rd, 4th, 6th - pupils equal and reactive to light, visual fields full to confrontation, extraocular muscles intact, no nystagmus 5th - facial sensation symmetric 7th - facial strength symmetric 8th - hearing intact to whispher 9th - palate elevates symmetrically, uvula midline 11th - shoulder shrug symmetric 12th - tongue protrusion midline  MOTOR:  normal bulk and tone, full strength in the BUE, BLE  SENSORY:  normal and symmetric to light touch, temperature, vibration; EXCEPT DECR PP, TEMP, VIB IN RIGHT LEG THIGH TO  FOOT  COORDINATION:  finger-nose-finger, fine finger movements normal  REFLEXES:  deep tendon reflexes TRACE AT BUE AND KNEES; ABSENT AT ANKLES  GAIT/STATION:  narrow based gait; DIFF WITH TANDEM    DIAGNOSTIC DATA (LABS, IMAGING, TESTING) - I reviewed patient records, labs, notes, testing and imaging myself where available.  Lab Results  Component Value Date   WBC 6.3 02/07/2022   HGB 12.1 02/07/2022   HCT 34.9 (L) 02/07/2022   MCV 89.7 02/07/2022   PLT 212 02/07/2022      Component Value Date/Time   NA 139 05/08/2024 1352   NA 143 07/03/2023 1226   K 3.9 05/08/2024 1352   CL 106 05/08/2024 1352   CO2 20 (L) 05/08/2024 1352   GLUCOSE 102 (H) 05/08/2024 1352   BUN 15 05/08/2024 1352   BUN 19 07/03/2023 1226   CREATININE 0.97 05/08/2024 1352   CREATININE 0.79 09/29/2014 0959   CALCIUM  9.4 05/08/2024 1352   PROT 6.3 (L) 11/17/2021 0132   ALBUMIN 3.6 11/17/2021 0132   AST 19 11/17/2021 0132   ALT 18 03/17/2022 1124   ALKPHOS 66 11/17/2021 0132   BILITOT 0.7 11/17/2021 0132   GFRNONAA 59 (L) 05/08/2024 1352   GFRAA >60 06/12/2015 0525   Lab Results  Component Value Date   CHOL 262 (H) 07/03/2023   HDL 45 07/03/2023   LDLCALC 177 (H) 07/03/2023   TRIG 214 (H) 07/03/2023   CHOLHDL 5.8 (H) 07/03/2023   Lab Results  Component Value Date   HGBA1C 6.3 (H) 11/17/2021   Lab Results  Component Value Date   VITAMINB12 484 09/05/2022   Lab Results  Component Value Date   TSH 2.470 07/03/2023    11/18/14 CT head [I reviewed images myself and agree with interpretation. -VRP]  1. No acute findings. 2. Chronic small vessel disease, stable from brain MRI March 2015.  09/12/14 MRI brain [I reviewed images myself and agree with interpretation. -VRP]  1. Mild scattered periventricular and subcortical foci of T2 hyperintensities. These findings are non-specific and considerations include autoimmune, inflammatory, post-infectious, microvascular ischemic or migraine  associated etiologies..   2. No significant change from MRI on 04/06/11.  06/12/15 MRI brain / MRA head [I reviewed images myself and agree with interpretation. -VRP]   1. Incomplete brain MRI as above.  No acute infarct. 2. Mild chronic small vessel  ischemic disease. 3. New/increased, moderate right P2 PCA stenosis. 4. Moderate to severe irregular narrowing throughout the right MCA branch vessels, similar to prior. 5. Mild stenoses of the proximal cavernous right ICA and proximal right ACA.  06/12/15 carotid u/s  - Bilateral - 1% to 39% ICA stenosis. Vertebral arery flow is antegrade.  06/12/15 TTE  - Left ventricle: The cavity size was normal. Wall thickness was   normal. Systolic function was vigorous. The estimated ejection   fraction was in the range of 65% to 70%. Doppler parameters are   consistent with abnormal left ventricular relaxation (grade 1   diastolic dysfunction). - Aortic valve: There was trivial regurgitation.  05/08/24 MRI brain  1. No acute intracranial abnormality. 2. Remote lacunar infarct in the right thalamus, new since 2023. 3. Remote lacunar infarct in the left paramedian pons with mild surrounding gliosis, similar to prior. 4. Additional remote infarcts in the left cerebellum, increased from prior. 5. Remote lacunar infarcts in the bilateral basal ganglia, similar to prior. 6. Chronic microvascular ischemic changes, similar to prior.  05/08/24 MRI thoracic spine 1. 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 2. Severe right facet arthrosis at T3-T4 with moderate to severe right neural foraminal stenosis. 3. Diffuse disc degeneration without  significant spinal canal stenosis  05/08/24 MRI lumbar spine [I reviewed images myself and agree with interpretation. Mild-moderate spinal stenosis at L3-4. -VRP]  1. Progressive, diffuse lumbar disc and facet degeneration without significant spinal stenosis. 2. Prominent left foraminal disc  protrusion at L3-L4 with moderate left neural foraminal stenosis and potential left L3 nerve impingement. 3. Mild lateral recess and neural foraminal stenosis at L2-L3.     ASSESSMENT AND PLAN  78 y.o. year old female here with:  Dx:  Right leg numbness - Plan: NCV with EMG(electromyography), CANCELED: NCV with EMG(electromyography)    PLAN:  RIGHT LEG NUMBNESS (lumbar radiculopathy vs lumbar plexopathy vs peripheral neuropathy) - check EMG/NCS - check vascular arterial u/s BLE  STROKE PREVENTION - continue aspirin  81mg  daily - consider statin vs repatha  - consider BP meds  - consider sleep study (h/o OSA on CPAP > 5 years ago)  Orders Placed This Encounter  Procedures   NCV with EMG(electromyography)   VAS US  ABI WITH/WO TBI   Return for for NCV/EMG.    EDUARD FABIENE HANLON, MD 05/13/2024, 3:10 PM Certified in Neurology, Neurophysiology and Neuroimaging  [facility] [address], [address] [place], [place] [address] [phone]

## 2024-05-13 NOTE — Patient Instructions (Signed)
 RIGHT LEG NUMBNESS (lumbar radiculopathy vs lumbar plexopathy vs peripheral neuropathy) - check EMG/NCS  STROKE PREVENTION - continue aspirin  81mg  daily - consider statin vs repatha  - consider BP meds  - consider sleep study (h/o OSA on CPAP > 5 years ago)

## 2024-05-17 ENCOUNTER — Ambulatory Visit (HOSPITAL_COMMUNITY)
Admission: RE | Admit: 2024-05-17 | Discharge: 2024-05-17 | Disposition: A | Discharge status: Home / Self Care | Source: Ambulatory Visit | Attending: Diagnostic Neuroimaging | Admitting: Diagnostic Neuroimaging

## 2024-05-17 DIAGNOSIS — R200 Anesthesia of skin: Secondary | ICD-10-CM | POA: Diagnosis present

## 2024-05-17 LAB — VAS US ABI WITH/WO TBI
Left ABI: 1.07
Right ABI: 0.96

## 2024-06-07 ENCOUNTER — Ambulatory Visit: Payer: Self-pay | Admitting: Diagnostic Neuroimaging

## 2024-08-07 ENCOUNTER — Encounter: Admitting: Neurology
